# Patient Record
Sex: Male | Born: 1960 | Race: White | Hispanic: No | Marital: Married | Smoking: Never smoker
Health system: Southern US, Community
[De-identification: ages and names within clinical notes are randomized; demographics above are authoritative.]

## PROBLEM LIST (undated history)

## (undated) DIAGNOSIS — G629 Polyneuropathy, unspecified: Secondary | ICD-10-CM

## (undated) DIAGNOSIS — Z87442 Personal history of urinary calculi: Secondary | ICD-10-CM

## (undated) DIAGNOSIS — J45909 Unspecified asthma, uncomplicated: Secondary | ICD-10-CM

## (undated) DIAGNOSIS — N400 Enlarged prostate without lower urinary tract symptoms: Secondary | ICD-10-CM

## (undated) DIAGNOSIS — M797 Fibromyalgia: Secondary | ICD-10-CM

## (undated) DIAGNOSIS — N200 Calculus of kidney: Secondary | ICD-10-CM

## (undated) DIAGNOSIS — Z8719 Personal history of other diseases of the digestive system: Secondary | ICD-10-CM

## (undated) DIAGNOSIS — K219 Gastro-esophageal reflux disease without esophagitis: Secondary | ICD-10-CM

## (undated) DIAGNOSIS — T7840XA Allergy, unspecified, initial encounter: Secondary | ICD-10-CM

## (undated) DIAGNOSIS — G4730 Sleep apnea, unspecified: Secondary | ICD-10-CM

## (undated) DIAGNOSIS — E119 Type 2 diabetes mellitus without complications: Secondary | ICD-10-CM

## (undated) DIAGNOSIS — K2270 Barrett's esophagus without dysplasia: Secondary | ICD-10-CM

## (undated) DIAGNOSIS — E785 Hyperlipidemia, unspecified: Secondary | ICD-10-CM

## (undated) DIAGNOSIS — I10 Essential (primary) hypertension: Secondary | ICD-10-CM

## (undated) HISTORY — DX: Calculus of kidney: N20.0

## (undated) HISTORY — DX: Barrett's esophagus without dysplasia: K22.70

## (undated) HISTORY — DX: Gastro-esophageal reflux disease without esophagitis: K21.9

## (undated) HISTORY — DX: Unspecified asthma, uncomplicated: J45.909

## (undated) HISTORY — DX: Allergy, unspecified, initial encounter: T78.40XA

## (undated) HISTORY — DX: Hyperlipidemia, unspecified: E78.5

## (undated) HISTORY — DX: Polyneuropathy, unspecified: G62.9

## (undated) HISTORY — PX: OTHER SURGICAL HISTORY: SHX169

## (undated) HISTORY — DX: Fibromyalgia: M79.7

---
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
## 1999-11-26 HISTORY — PX: NASAL SINUS SURGERY: SHX719

## 2003-11-26 HISTORY — PX: OTHER SURGICAL HISTORY: SHX169

## 2012-07-15 ENCOUNTER — Other Ambulatory Visit: Payer: Self-pay | Admitting: Family Medicine

## 2012-07-15 DIAGNOSIS — Z8249 Family history of ischemic heart disease and other diseases of the circulatory system: Secondary | ICD-10-CM

## 2012-08-06 ENCOUNTER — Ambulatory Visit
Admission: RE | Admit: 2012-08-06 | Discharge: 2012-08-06 | Disposition: A | Discharge status: Home / Self Care | Payer: 59 | Source: Ambulatory Visit | Attending: Family Medicine | Admitting: Family Medicine

## 2012-08-06 DIAGNOSIS — Z8249 Family history of ischemic heart disease and other diseases of the circulatory system: Secondary | ICD-10-CM

## 2012-08-22 ENCOUNTER — Emergency Department (HOSPITAL_COMMUNITY): Payer: 59

## 2012-08-22 ENCOUNTER — Encounter (HOSPITAL_COMMUNITY): Payer: Self-pay | Admitting: Emergency Medicine

## 2012-08-22 ENCOUNTER — Emergency Department (HOSPITAL_COMMUNITY)
Admission: EM | Admit: 2012-08-22 | Discharge: 2012-08-22 | Disposition: A | Discharge status: Home / Self Care | Payer: 59 | Attending: Emergency Medicine | Admitting: Emergency Medicine

## 2012-08-22 DIAGNOSIS — W260XXA Contact with knife, initial encounter: Secondary | ICD-10-CM | POA: Insufficient documentation

## 2012-08-22 DIAGNOSIS — W261XXA Contact with sword or dagger, initial encounter: Secondary | ICD-10-CM | POA: Insufficient documentation

## 2012-08-22 DIAGNOSIS — IMO0002 Reserved for concepts with insufficient information to code with codable children: Secondary | ICD-10-CM

## 2012-08-22 DIAGNOSIS — S61209A Unspecified open wound of unspecified finger without damage to nail, initial encounter: Secondary | ICD-10-CM | POA: Insufficient documentation

## 2012-08-22 DIAGNOSIS — Z23 Encounter for immunization: Secondary | ICD-10-CM | POA: Insufficient documentation

## 2012-08-22 DIAGNOSIS — Y93H2 Activity, gardening and landscaping: Secondary | ICD-10-CM | POA: Insufficient documentation

## 2012-08-22 DIAGNOSIS — I10 Essential (primary) hypertension: Secondary | ICD-10-CM | POA: Insufficient documentation

## 2012-08-22 HISTORY — DX: Benign prostatic hyperplasia without lower urinary tract symptoms: N40.0

## 2012-08-22 HISTORY — DX: Sleep apnea, unspecified: G47.30

## 2012-08-22 HISTORY — DX: Essential (primary) hypertension: I10

## 2012-08-22 MED ORDER — TRAMADOL HCL 50 MG PO TABS: 50.0000 mg | ORAL_TABLET | Freq: Four times a day (QID) | ORAL | Status: DC | PRN

## 2012-08-22 MED ORDER — OXYCODONE-ACETAMINOPHEN 5-325 MG PO TABS
1.0000 | ORAL_TABLET | Freq: Once | ORAL | Status: AC
  Administered 2012-08-22: 1 via ORAL

## 2012-08-22 MED ORDER — TETANUS-DIPHTHERIA TOXOIDS TD 5-2 LFU IM INJ
0.5000 mL | INJECTION | Freq: Once | INTRAMUSCULAR | Status: AC
  Administered 2012-08-22: 0.5 mL via INTRAMUSCULAR
  Filled 2012-08-22: qty 0.5

## 2012-08-22 MED ORDER — OXYCODONE-ACETAMINOPHEN 5-325 MG PO TABS
ORAL_TABLET | ORAL | Status: AC
  Filled 2012-08-22: qty 1

## 2012-08-22 MED ORDER — AMOXICILLIN-POT CLAVULANATE 875-125 MG PO TABS
1.0000 | ORAL_TABLET | Freq: Once | ORAL | Status: AC
  Administered 2012-08-22: 1 via ORAL
  Filled 2012-08-22: qty 1

## 2012-08-22 MED ORDER — OXYCODONE-ACETAMINOPHEN 5-325 MG PO TABS
1.0000 | ORAL_TABLET | Freq: Once | ORAL | Status: AC
  Administered 2012-08-22: 1 via ORAL
  Filled 2012-08-22: qty 1

## 2012-08-22 MED ORDER — AMOXICILLIN-POT CLAVULANATE 875-125 MG PO TABS: 1.0000 | ORAL_TABLET | Freq: Two times a day (BID) | ORAL | Status: DC

## 2012-08-22 MED ORDER — OXYCODONE-ACETAMINOPHEN 5-325 MG PO TABS: 1.0000 | ORAL_TABLET | Freq: Once | ORAL | Status: AC

## 2012-08-22 NOTE — ED Notes (Signed)
Wife driving pt home

## 2012-08-22 NOTE — ED Provider Notes (Signed)
Medical screening examination/treatment/procedure(s) were performed by non-physician practitioner and as supervising physician I was immediately available for consultation/collaboration.  Magdalene Tardiff B. Mikaelyn Arthurs, MD 08/22/12 2255 

## 2012-08-22 NOTE — ED Notes (Addendum)
Pt stated that he was cutting something and ended up cutting right index and middle finger.  Bleeding is controlled. Dressing on rt hand. Pain is 8 out of 10. Pulses intact. Hand and arm warm to touch. No cardiac or respiratory distress. Will continue to monitor.

## 2012-08-22 NOTE — ED Notes (Signed)
Dermabond removed from trauma pixis and made ready for application by NP.

## 2012-08-22 NOTE — ED Notes (Signed)
Pt was cutting a piece of wood with saw when wood snapped back and saw cut pt right pointer and middle finger. Bleeding controlled.

## 2012-08-22 NOTE — ED Provider Notes (Signed)
History     CSN: [id number]  Arrival date & time 08/22/12  1739   First MD Initiated Contact with Patient 08/22/12 2023      Chief Complaint  Patient presents with  . Extremity Laceration    (Consider location/radiation/quality/duration/timing/severity/associated sxs/prior treatment) HPI Comments: Trying to cut a round branch into fine slices when the branch flipped back pinching R index and middel fingers Now with large tissue deficit to the palmar surface on the index finger and small superficial slice to medial aspect of middle finger    The history is provided by the patient.    Past Medical History  Diagnosis Date  . BPH (benign prostatic hyperplasia)   . Hypertension   . Sleep apnea     Past Surgical History  Procedure Date  . Arm surgery   . Knee surgery     No family history on file.  History  Substance Use Topics  . Smoking status: Never Smoker   . Smokeless tobacco: Not on file  . Alcohol Use: Yes      Review of Systems  Constitutional: Negative for fever and diaphoresis.  Gastrointestinal: Negative for nausea.  Musculoskeletal: Negative for joint swelling.  Skin: Positive for wound.  Neurological: Negative for dizziness, weakness and numbness.    Allergies  Review of patient's allergies indicates no known allergies.  Home Medications   Current Outpatient Rx  Name Route Sig Dispense Refill  . BISOPROLOL-HYDROCHLOROTHIAZIDE 10-6.25 MG PO TABS Oral Take 1 tablet by mouth daily.    Marland Kitchen DOXAZOSIN MESYLATE ER 4 MG PO TB24 Oral Take 4 mg by mouth daily with breakfast.    . PRAVASTATIN SODIUM 40 MG PO TABS Oral Take 80 mg by mouth daily.    . AMOXICILLIN-POT CLAVULANATE 875-125 MG PO TABS Oral Take 1 tablet by mouth 2 (two) times daily. 13 tablet 0  . TRAMADOL HCL 50 MG PO TABS Oral Take 1 tablet (50 mg total) by mouth every 6 (six) hours as needed for pain. 15 tablet 0    BP 142/69  Pulse 88  Temp 98.8 F (37.1 C) (Oral)  Resp 22  SpO2  96%  Physical Exam  Constitutional: He is oriented to person, place, and time. He appears well-developed and well-nourished.  HENT:  Head: Normocephalic.  Eyes: Pupils are equal, round, and reactive to light.  Neck: Normal range of motion.  Cardiovascular: Normal rate.   Pulmonary/Chest: Effort normal.  Musculoskeletal: Normal range of motion. He exhibits tenderness.  Neurological: He is alert and oriented to person, place, and time.  Skin: Skin is warm.       1.5cm X .5cm avulsion of tissue form the palmar aspect of R index finger and superficial slice/laceration to medial aspect R middle finger     ED Course  LACERATION REPAIR Date/Time: 08/22/2012 8:46 PM Performed by: Arman Filter Authorized by: Arman Filter Consent: Verbal consent obtained. Risks and benefits: risks, benefits and alternatives were discussed Consent given by: patient Patient understanding: patient states understanding of the procedure being performed Patient identity confirmed: verbally with patient Time out: Immediately prior to procedure a "time out" was called to verify the correct patient, procedure, equipment, support staff and site/side marked as required. Body area: upper extremity Location details: right index finger Laceration length: 1.5 cm Foreign bodies: wood Tendon involvement: none Nerve involvement: none Vascular damage: no Anesthetic total: 0 ml Patient sedated: no Irrigation solution: saline Amount of cleaning: standard Debridement: none Degree of undermining: none Dressing: gauze  roll and gauze packing Patient tolerance: Patient tolerated the procedure well with no immediate complications. Comments: Packed and dressed    (including critical care time)  Labs Reviewed - No data to display Dg Finger Index Right  08/22/2012  *RADIOLOGY REPORT*  Clinical Data: Extremity laceration  RIGHT INDEX FINGER 2+V  Comparison: None.  Findings: There is soft tissue injury to the distal aspect  of the 2nd right digit.  No evidence of radiodense foreign body.  No evidence of fracture.  IMPRESSION: No fracture or foreign body.  Soft tissue injury.   Original Report Authenticated By: Genevive Bi, M.D.    Dg Finger Middle Right  08/22/2012  *RADIOLOGY REPORT*  Clinical Data: Extremity laceration.  RIGHT MIDDLE FINGER 2+V  Comparison: None.  Findings: No fracture of the middle digit.  There is no radiodense foreign body.  IMPRESSION: No fracture or foreign body.   Original Report Authenticated By: Genevive Bi, M.D.      1. Laceration     LACERATION REPAIR Performed by: Arman Filter Authorized by: Arman Filter Consent: Verbal consent obtained. Risks and benefits: risks, benefits and alternatives were discussed Consent given by: patient Patient identity confirmed: provided demographic data Prepped and Draped in normal sterile fashion Wound explored  Laceration Location: middle finger  Laceration Length: .25cm  No Foreign Bodies seen or palpated  Anesthesia: local infiltration  Local anesthetic: lidocaine none  Anesthetic total: 0ml  Irrigation method: syringe Amount of cleaning: standard  Skin closure: dermabond  Number of sutures: 0  Technique: cleaned and dressed after derma bond   Patient tolerance: Patient tolerated the procedure well with no immediate complications.  MDM  Tissue deficit to distal R index finger packed and dressed  Will Rx antibiotics, pain control and refer  to hand surgery for follow up evaluation and treatment         Arman Filter, NP 08/22/12 2124  Arman Filter, NP 08/22/12 2124

## 2014-06-17 ENCOUNTER — Telehealth: Payer: Self-pay | Admitting: Cardiology

## 2014-06-17 NOTE — Telephone Encounter (Signed)
New Message  Pt called says during his recent travels he has had severe chest pains. Pt went to an ER and found that his heart was enlarged. Pt says he has never heard this from Dr. Marlou Porch and requests a call back to discuss. Please call

## 2014-06-17 NOTE — Telephone Encounter (Signed)
Calling stating he has been traveling and had to go to ER with CP.  Physician told him that he had an enlarged heart and he states he has never been told that is heart was enlarged. States they just told him to take Tylenol for the pain.  Reviewed records from [facility] and could not find documentation of enlarged heart.  Has not had a CXR or Echo.  Advised would send message to Dr. Marlou Porch for his evaluation.  Was going to make him an app but he was getting ready to board a plane and couldn't wait to schedule appointment. Will call back to schedule.

## 2014-06-18 NOTE — Telephone Encounter (Signed)
Have him come in to see me. Likely CXR was read as cardiomegaly (occasionally oversensitive for this finding and can overcall enlarged heart).   2013 NUC stress low risk. Several family members with CAD. ECG no LVH.   Candee Furbish, MD

## 2014-06-21 NOTE — Telephone Encounter (Signed)
Spoke with pt who states enlarged heart DX was from a CXR.  He will akeep the appt as scheduled.  He will bring his records from the ED where he was seen when he comes for his appt.

## 2014-06-21 NOTE — Telephone Encounter (Signed)
Follow up  Pt returned call.. Please call

## 2014-06-21 NOTE — Telephone Encounter (Signed)
Left message for pt to call back to discuss

## 2014-06-21 NOTE — Telephone Encounter (Signed)
Left message for pt to c/b 

## 2014-06-21 NOTE — Telephone Encounter (Signed)
New Prob    Pt would like to speak to a nurse regarding previous encounter in ER (out of town) for chest wall muscle strain and enlarged heart diagnosis. Pt has an appt with Kerin Ransom PA-C 8/7 but wants to know if he possibly needs to be seen sooner. Please call.

## 2014-07-01 ENCOUNTER — Encounter: Payer: Self-pay | Admitting: Cardiology

## 2014-07-01 ENCOUNTER — Ambulatory Visit (INDEPENDENT_AMBULATORY_CARE_PROVIDER_SITE_OTHER): Payer: 59 | Admitting: Cardiology

## 2014-07-01 VITALS — BP 122/64 | HR 60 | Ht 70.0 in | Wt 257.9 lb

## 2014-07-01 DIAGNOSIS — R0600 Dyspnea, unspecified: Secondary | ICD-10-CM

## 2014-07-01 DIAGNOSIS — Z8249 Family history of ischemic heart disease and other diseases of the circulatory system: Secondary | ICD-10-CM | POA: Insufficient documentation

## 2014-07-01 DIAGNOSIS — I10 Essential (primary) hypertension: Secondary | ICD-10-CM | POA: Insufficient documentation

## 2014-07-01 DIAGNOSIS — R0989 Other specified symptoms and signs involving the circulatory and respiratory systems: Secondary | ICD-10-CM

## 2014-07-01 DIAGNOSIS — R079 Chest pain, unspecified: Secondary | ICD-10-CM

## 2014-07-01 DIAGNOSIS — G4730 Sleep apnea, unspecified: Secondary | ICD-10-CM

## 2014-07-01 DIAGNOSIS — E785 Hyperlipidemia, unspecified: Secondary | ICD-10-CM | POA: Insufficient documentation

## 2014-07-01 DIAGNOSIS — E669 Obesity, unspecified: Secondary | ICD-10-CM | POA: Insufficient documentation

## 2014-07-01 DIAGNOSIS — R0609 Other forms of dyspnea: Secondary | ICD-10-CM | POA: Insufficient documentation

## 2014-07-01 NOTE — Assessment & Plan Note (Signed)
Seen in an ER in [place] 05/26/14

## 2014-07-01 NOTE — Assessment & Plan Note (Signed)
C-pap compliant 

## 2014-07-01 NOTE — Assessment & Plan Note (Signed)
BMI 37  

## 2014-07-01 NOTE — Assessment & Plan Note (Signed)
He was told he had an "enlarged heart" at ER visit in July

## 2014-07-01 NOTE — Assessment & Plan Note (Signed)
Brother died at 93

## 2014-07-01 NOTE — Assessment & Plan Note (Signed)
On meds, controlled

## 2014-07-01 NOTE — Progress Notes (Signed)
07/01/2014 Timothy [name]   [birth year]  [id number]  Primary Physicia FULP, CAMMIE, MD Primary Cardiologist: Dr Marlou Porch  HPI:  53 y/o male, seen by Dr Marlou Porch a few years ago after the pts brother died of an MI at age 52. 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 The pt had a stress test 2 years ago that was normal per his report. Recently (05/26/14), while traveling for work, he was seen in an ER in [place] for chest pain. The pt says he had Lt chest and Lt shoulder discomfort that was "pretty bad". He denies any associated nausea or diaphoresis associated with this. He was diagnosed with muscular skeletal pain. Since then he has had milder, intermittent chest discomfort. Recently while rushing through an airport he noticed some DOE that he felt was a little unusual for him. He denies orthopnea or PND.   Current Outpatient Prescriptions  Medication Sig Dispense Refill  . aspirin EC 81 MG tablet Take 81 mg by mouth daily.      . bisoprolol-hydrochlorothiazide (ZIAC) 5-6.25 MG per tablet Take 1 tablet by mouth daily.      . cetirizine (ZYRTEC) 10 MG tablet Take 10 mg by mouth daily.      Marland Kitchen doxazosin (CARDURA XL) 4 MG 24 hr tablet Take 4 mg by mouth daily with breakfast.      . Multiple Vitamin (MULTIVITAMIN) tablet Take 1 tablet by mouth daily.      . pravastatin (PRAVACHOL) 40 MG tablet Take 80 mg by mouth daily.       No current facility-administered medications for this visit.    No Known Allergies  History   Social History  . Marital Status: Married    Spouse Name: N/A    Number of Children: N/A  . Years of Education: N/A   Occupational History  . Not on file.   Social History Main Topics  . Smoking status: Never Smoker   . Smokeless tobacco: Not on file  . Alcohol Use: Yes  . Drug Use: No  . Sexual Activity: Not on file   Other Topics Concern  . Not on file   Social History Narrative  . No narrative  on file     Review of Systems: General: negative for chills, fever, night sweats or weight changes.  Dermatological: negative for rash Respiratory: negative for cough or wheezing Urologic: negative for hematuria Abdominal: negative for nausea, vomiting, diarrhea, bright red blood per rectum, melena, or hematemesis Neurologic: negative for visual changes, syncope, or dizziness Pt has a history of sleep apnea and is compliant with C-pap All other systems reviewed and are otherwise negative except as noted above.    Blood pressure 122/64, pulse 60, height 5\' 10"  (1.778 m), weight 257 lb 14.4 oz (116.983 kg).  General appearance: alert, cooperative, no distress and moderately obese Neck: no carotid bruit and no JVD Lungs: clear to auscultation bilaterally Heart: regular rate and rhythm, S1, S2 normal, no murmur, click, rub or gallop Abdomen: soft, non-tender; bowel sounds normal; no masses,  no organomegaly Extremities: extremities normal, atraumatic, no cyanosis or edema Pulses: 2+ and symmetric Skin: Skin color, texture, turgor normal. No rashes or lesions Neurologic: Grossly normal  EKG NSR   ASSESSMENT AND PLAN:   Chest pain with moderate risk of acute coronary syndrome Seen in an ER in [place] 05/26/14  Family history of coronary artery disease in brother Brother died  at 60  Sleep apnea C-pap compliant  Obesity-  BMI 37  Dyspnea on exertion He was told he had an "enlarged heart" at ER visit in July  HTN (hypertension) On meds, controlled  Dyslipidemia On Pravachol    PLAN  The pt has had chest pain and DOE and has multiple cardiac risk factors. He was also told he had an "enlarged heart" by CXR. I have ordered an exercise Myoview and an Echocardiogram. We'll see if we can get labs from Dr Antony Blackbird. The pt should see Dr Marlou Porch after the above tests.   Nohea Kras KPA-C 07/01/2014 9:12 AM

## 2014-07-01 NOTE — Assessment & Plan Note (Signed)
On Pravachol 

## 2014-07-01 NOTE — Patient Instructions (Signed)
Kerin Ransom, PA-C has requested that you have an echocardiogram. Echocardiography is a painless test that uses sound waves to create images of your heart. It provides your doctor with information about the size and shape of your heart and how well your heart's chambers and valves are working. This procedure takes approximately one hour. There are no restrictions for this procedure.  Your physician has requested that you have en exercise stress myoview. For further information please visit [email or web address]. Please follow instruction sheet, as given.  Your physician recommends that you schedule a follow-up appointment in 4 weeks with Dr Candee Furbish.

## 2014-07-14 ENCOUNTER — Telehealth (HOSPITAL_COMMUNITY): Payer: Self-pay

## 2014-07-14 NOTE — Telephone Encounter (Signed)
Encounter complete. 

## 2014-07-15 ENCOUNTER — Ambulatory Visit (HOSPITAL_COMMUNITY)
Admission: RE | Admit: 2014-07-15 | Discharge: 2014-07-15 | Disposition: A | Discharge status: Home / Self Care | Payer: 59 | Source: Ambulatory Visit | Attending: Cardiovascular Disease | Admitting: Cardiovascular Disease

## 2014-07-15 ENCOUNTER — Ambulatory Visit (HOSPITAL_BASED_OUTPATIENT_CLINIC_OR_DEPARTMENT_OTHER)
Admission: RE | Admit: 2014-07-15 | Discharge: 2014-07-15 | Disposition: A | Discharge status: Home / Self Care | Payer: 59 | Source: Ambulatory Visit | Attending: Cardiovascular Disease | Admitting: Cardiovascular Disease

## 2014-07-15 DIAGNOSIS — R079 Chest pain, unspecified: Secondary | ICD-10-CM | POA: Diagnosis present

## 2014-07-15 DIAGNOSIS — R0989 Other specified symptoms and signs involving the circulatory and respiratory systems: Secondary | ICD-10-CM | POA: Insufficient documentation

## 2014-07-15 DIAGNOSIS — R0609 Other forms of dyspnea: Secondary | ICD-10-CM | POA: Insufficient documentation

## 2014-07-15 DIAGNOSIS — I517 Cardiomegaly: Secondary | ICD-10-CM

## 2014-07-15 MED ORDER — TECHNETIUM TC 99M SESTAMIBI GENERIC - CARDIOLITE
30.4000 | Freq: Once | INTRAVENOUS | Status: AC | PRN
  Administered 2014-07-15: 30 via INTRAVENOUS

## 2014-07-15 MED ORDER — TECHNETIUM TC 99M SESTAMIBI GENERIC - CARDIOLITE
10.2000 | Freq: Once | INTRAVENOUS | Status: AC | PRN
  Administered 2014-07-15: 10 via INTRAVENOUS

## 2014-07-15 NOTE — Progress Notes (Signed)
2D Echocardiogram Complete.  07/15/2014   Sang Blount, RDCS  

## 2014-07-15 NOTE — Procedures (Addendum)
[facility] [address] [place] [address] [phone]  Cardiology Nuclear Med Study  Timothy [name] is a 53 y.o. male     MRN : [id number]     DOB: 22-Jan-1961  Procedure Date: 07/15/2014  Nuclear Med Background Indication for Stress Test:  Evaluation for Ischemia, [facility] and Abnormal EKG History:  "enlarged heart";No other prior cardiac history reported;No prior respiratory history reported;Last NUC MPI in 2003-no further information available. Cardiac Risk Factors: Family History - CAD, Hypertension, Lipids and Obesity  Symptoms:  Chest Pain, DOE and Fatigue   Nuclear Pre-Procedure Caffeine/Decaff Intake:  7:00pm NPO After: 5:00am   IV Site: R Forearm  IV 0.9% NS with Angio Cath:  22g  Chest Size (in):  50"  IV Started by: Rolene Course, RN  Height: 5\' 10"  (1.778 m)  Cup Size: n/a  BMI:  Body mass index is 36.88 kg/(m^2). Weight:  257 lb (116.574 kg)   Tech Comments:  n/a    Nuclear Med Study 1 or 2 day study: 1 day  Stress Test Type:  Stress  Order Authorizing Provider:  Candee Furbish, MD   Resting Radionuclide: Technetium 27m Sestamibi  Resting Radionuclide Dose: 10.2 mCi   Stress Radionuclide:  Technetium 78m Sestamibi  Stress Radionuclide Dose: 30.4 mCi           Stress Protocol Rest HR: 62 Stress HR: 153  Rest BP: 110/73 Stress BP: 209/61  Exercise Time (min): 8 METS: 10.1   Predicted Max HR: 166 bpm % Max HR: 92.17 bpm Rate Pressure Product: 31977  Dose of Adenosine (mg):  n/a Dose of Lexiscan: n/a mg  Dose of Atropine (mg): n/a Dose of Dobutamine: n/a mcg/kg/min (at max HR)  Stress Test Technologist: Leane Para, CCT Nuclear Technologist: Imagene Riches, CNMT   Rest Procedure:  Myocardial perfusion imaging was performed at rest 45 minutes following the intravenous administration of Technetium 49m Sestamibi. Stress Procedure:  The patient performed treadmill exercise using a Bruce  Protocol  for 8 minutes. The patient stopped due to SOB and denied any chest pain.  There were no significant ST-T wave changes.  Technetium 20m Sestamibi was injected IV at peak exercise and myocardial perfusion imaging was performed after a brief delay.  Transient Ischemic Dilatation (Normal <1.22):  1.03  QGS EDV:  111 ml QGS ESV:  44 ml LV Ejection Fraction: 60%     Rest ECG: NSR - Normal EKG  Stress ECG: Significant ST abnormalities consistent with ischemia. 1-2 mm horizontal ST depression in the inferior leads, less prominent in V5-V6  QPS Raw Data Images:  Normal; no motion artifact; normal heart/lung ratio. Stress Images:  There is decreased uptake in the apex. The defect is mall in extent , but moderate to severe in intensity Rest Images:  Comparison with the stress images reveals minimal improvement. Subtraction (SDS):  There is a fixed defect that is most consistent with a previous infarction. LV Wall Motion:  NL LV Function; NL Wall Motion  Impression Exercise Capacity:  Good exercise capacity. BP Response:  Hypertensive blood pressure response. Clinical Symptoms:  No significant symptoms noted. ECG Impression:  Significant ST abnormalities consistent with ischemia. Comparison with Prior Nuclear Study: No images to compare   Overall Impression:  Low risk stress nuclear study with a small apical scar with minimal peri-infarct ischemia.   Sanda Klein, MD  07/15/2014 12:53 PM

## 2014-07-19 ENCOUNTER — Telehealth: Payer: Self-pay | Admitting: Cardiology

## 2014-07-19 NOTE — Telephone Encounter (Signed)
LMTCB

## 2014-07-19 NOTE — Telephone Encounter (Signed)
New message    Calling for test results  

## 2014-07-19 NOTE — Telephone Encounter (Signed)
Spoke with patient about recent myoview results. 

## 2014-07-25 ENCOUNTER — Ambulatory Visit (INDEPENDENT_AMBULATORY_CARE_PROVIDER_SITE_OTHER): Payer: 59 | Admitting: Cardiology

## 2014-07-25 ENCOUNTER — Encounter: Payer: Self-pay | Admitting: Cardiology

## 2014-07-25 VITALS — BP 110/62 | HR 64 | Ht 70.0 in | Wt 259.0 lb

## 2014-07-25 DIAGNOSIS — Z8249 Family history of ischemic heart disease and other diseases of the circulatory system: Secondary | ICD-10-CM

## 2014-07-25 DIAGNOSIS — I10 Essential (primary) hypertension: Secondary | ICD-10-CM

## 2014-07-25 DIAGNOSIS — E669 Obesity, unspecified: Secondary | ICD-10-CM

## 2014-07-25 DIAGNOSIS — R0989 Other specified symptoms and signs involving the circulatory and respiratory systems: Secondary | ICD-10-CM

## 2014-07-25 DIAGNOSIS — E785 Hyperlipidemia, unspecified: Secondary | ICD-10-CM

## 2014-07-25 DIAGNOSIS — R0609 Other forms of dyspnea: Secondary | ICD-10-CM

## 2014-07-25 NOTE — Patient Instructions (Signed)
The current medical regimen is effective;  continue present plan and medications.  Follow up in 1 year with Dr Skains.  You will receive a letter in the mail 2 months before you are due.  Please call us when you receive this letter to schedule your follow up appointment.  

## 2014-07-25 NOTE — Progress Notes (Signed)
[facility]. [address], [address] [place], [place]  [address] Phone: [phone] Fax:  [phone]  Date:  07/25/2014   ID:  Timothy [name], DOB July 22, 1961, MRN [id number]  PCP:  Martinique, BETTY G, MD   History of Present Illness: Timothy [name] is a 53 y.o. male with brother who died of myocardial infarction at age 39, nuclear stress test in 2013 that was normal who was in [place], chest pain associated with nausea, diaphoresis diagnosed with MSK pain. Noted dyspnea on exertion when rushing through the airport.  Because of this, he underwent a nuclear stress test again on 07/15/14 that was low risk, no ischemia. He also had echocardiogram on 07/15/14 that showed normal ejection fraction, grade 2 diastolic dysfunction, mildly elevated pulmonary pressure of 33 mm mercury systolic.  His symptoms have improved. Occasionally he may feel some shortness of breath with exertion. We discussed weight. He recently changed jobs where he will not have overnight phone calls, but more travels.   Wt Readings from Last 3 Encounters:  07/25/14 259 lb (117.482 kg)  07/15/14 257 lb (116.574 kg)  07/01/14 257 lb 14.4 oz (116.983 kg)     Past Medical History  Diagnosis Date  . BPH (benign prostatic hyperplasia)   . Hypertension   . Sleep apnea   . Kidney stones     Past Surgical History  Procedure Laterality Date  . Arm surgery    . Knee surgery    . Nasal sinus surgery      Current Outpatient Prescriptions  Medication Sig Dispense Refill  . aspirin EC 81 MG tablet Take 81 mg by mouth daily.      . bisoprolol-hydrochlorothiazide (ZIAC) 5-6.25 MG per tablet Take 1 tablet by mouth daily.      . cetirizine (ZYRTEC) 10 MG tablet Take 10 mg by mouth daily.      Marland Kitchen doxazosin (CARDURA XL) 4 MG 24 hr tablet Take 4 mg by mouth daily with breakfast.      . Multiple Vitamin (MULTIVITAMIN) tablet Take 1 tablet by mouth daily.      . pravastatin (PRAVACHOL) 80 MG tablet Take 80 mg by mouth daily.       No current  facility-administered medications for this visit.    Allergies:   No Known Allergies  Social History:  The patient  reports that he has never smoked. He does not have any smokeless tobacco history on file. He reports that he drinks alcohol. He reports that he does not use illicit drugs.   Family History  Problem Relation Age of Onset  . Heart disease Father   . Heart disease Paternal Grandmother   . Valvular heart disease Brother   . Heart disease Brother   . Chiari malformation Daughter     ROS:  Please see the history of present illness.   Denies any fevers, chills, orthopnea, PND, syncope   All other systems reviewed and negative.   PHYSICAL EXAM: VS:  BP 110/62  Pulse 64  Ht 5\' 10"  (1.778 m)  Wt 259 lb (117.482 kg)  BMI 37.16 kg/m2 Well nourished, well developed, in no acute distress HEENT: normal, /AT, EOMI Neck: no JVD, normal carotid upstroke, no bruit Cardiac:  normal S1, S2; RRR; no murmur Lungs:  clear to auscultation bilaterally, no wheezing, rhonchi or rales Abd: soft, nontender, no hepatomegaly, no bruitsOverweight Ext: no edema, 2+ distal pulses Skin: warm and dry GU: deferred Neuro: no focal abnormalities noted, AAO x 3  EKG:  None today    Other testing, lab work reviewed. ASSESSMENT AND PLAN:  1. Chest pain/dyspnea-improved. Component of deconditioning, obesity playing a role. Could consider pulmonary evaluation. Nuclear stress test and echocardiogram reassuring. Echocardiogram did demonstrate mildly elevated pulmonary pressures. This is likely secondary to obesity/ OSA. Encourage weight loss. 2. Family history of CAD-Brother 73 MI 3. Obesity-decrease carbohydrates, increase greens, plant products. 4. Hyperlipidemia-pravastatin. Excellent. 5. Hypertension-very well controlled. Excellent. Medications reviewed. 6. We will see him back in one year, prevention.  Signed, Candee Furbish, MD [facility]  07/25/2014 8:10 AM

## 2015-02-06 ENCOUNTER — Other Ambulatory Visit: Payer: Self-pay | Admitting: Family Medicine

## 2015-02-06 DIAGNOSIS — E010 Iodine-deficiency related diffuse (endemic) goiter: Secondary | ICD-10-CM

## 2015-04-07 ENCOUNTER — Ambulatory Visit
Admission: RE | Admit: 2015-04-07 | Discharge: 2015-04-07 | Disposition: A | Discharge status: Home / Self Care | Payer: 59 | Source: Ambulatory Visit | Attending: Family Medicine | Admitting: Family Medicine

## 2015-04-07 DIAGNOSIS — E010 Iodine-deficiency related diffuse (endemic) goiter: Secondary | ICD-10-CM

## 2015-06-30 ENCOUNTER — Other Ambulatory Visit: Payer: Self-pay | Admitting: Family Medicine

## 2015-06-30 ENCOUNTER — Ambulatory Visit
Admission: RE | Admit: 2015-06-30 | Discharge: 2015-06-30 | Disposition: A | Discharge status: Home / Self Care | Payer: 59 | Source: Ambulatory Visit | Attending: Family Medicine | Admitting: Family Medicine

## 2015-06-30 DIAGNOSIS — M5416 Radiculopathy, lumbar region: Secondary | ICD-10-CM

## 2016-01-05 ENCOUNTER — Encounter: Payer: Self-pay | Admitting: Cardiology

## 2016-01-05 ENCOUNTER — Ambulatory Visit (INDEPENDENT_AMBULATORY_CARE_PROVIDER_SITE_OTHER): Payer: 59 | Admitting: Cardiology

## 2016-01-05 VITALS — BP 128/72 | HR 68 | Ht 70.0 in | Wt 247.0 lb

## 2016-01-05 DIAGNOSIS — E785 Hyperlipidemia, unspecified: Secondary | ICD-10-CM

## 2016-01-05 DIAGNOSIS — I10 Essential (primary) hypertension: Secondary | ICD-10-CM | POA: Diagnosis not present

## 2016-01-05 DIAGNOSIS — Z8249 Family history of ischemic heart disease and other diseases of the circulatory system: Secondary | ICD-10-CM

## 2016-01-05 DIAGNOSIS — R5383 Other fatigue: Secondary | ICD-10-CM | POA: Diagnosis not present

## 2016-01-05 NOTE — Patient Instructions (Signed)

## 2016-01-05 NOTE — Progress Notes (Signed)
[facility]. [address], [place]  [address] Phone: [phone] Fax:  [phone]  Date:  01/05/2016   ID:  Timothy [name], DOB [birth year], MRN [id number]  PCP:  [place], BETTY G, MD   History of Present Illness: Timothy [name] is a 55 y.o. male with brother who died of myocardial infarction at age 53, nuclear stress test in 2013 that was normal who was in [place], chest pain associated with nausea, diaphoresis diagnosed with MSK pain. Noted dyspnea on exertion when rushing through the airport.  Because of this, he underwent a nuclear stress test again on 07/15/14 that was low risk, no ischemia. He also had echocardiogram on 07/15/14 that showed normal ejection fraction, grade 2 diastolic dysfunction, mildly elevated pulmonary pressure of 33 mm mercury systolic.  His symptoms have improved. Occasionally he may feel some shortness of breath with exertion. We discussed weight. He recently changed jobs where he will not have overnight phone calls, but more travels.  01/05/16 - Weight up to 260. Diet. Increased fatigue. ?Calorie, increased swelling ankle. He was down to 240 pounds. Has subsequently gained back some weight. He is felt some fatigue in his worried that this is possibly related to his heart potentially. No chest pain, no significant shortness of breath.   Wt Readings from Last 3 Encounters:  01/05/16 247 lb (112.038 kg)  07/25/14 259 lb (117.482 kg)  07/15/14 257 lb (116.574 kg)     Past Medical History  Diagnosis Date  . BPH (benign prostatic hyperplasia)   . Hypertension   . Sleep apnea   . Kidney stones     Past Surgical History  Procedure Laterality Date  . Arm surgery    . Knee surgery    . Nasal sinus surgery      Current Outpatient Prescriptions  Medication Sig Dispense Refill  . aspirin EC 81 MG tablet Take 81 mg by mouth daily.    . bisoprolol-hydrochlorothiazide (ZIAC) 5-6.25 MG per tablet Take 1 tablet by mouth daily.    . cetirizine (ZYRTEC) 10  MG tablet Take 10 mg by mouth daily.    Marland Kitchen doxazosin (CARDURA XL) 4 MG 24 hr tablet Take 4 mg by mouth daily with breakfast.    . Multiple Vitamin (MULTIVITAMIN) tablet Take 1 tablet by mouth daily.    . pravastatin (PRAVACHOL) 80 MG tablet Take 80 mg by mouth daily.     No current facility-administered medications for this visit.    Allergies:   No Known Allergies  Social History:  The patient  reports that he has never smoked. He does not have any smokeless tobacco history on file. He reports that he drinks alcohol. He reports that he does not use illicit drugs.   Family History  Problem Relation Age of Onset  . Heart disease Father   . Heart disease Paternal Grandmother   . Valvular heart disease Brother   . Heart disease Brother   . Chiari malformation Daughter     ROS:  Please see the history of present illness.   Denies any fevers, chills, orthopnea, PND, syncope   All other systems reviewed and negative.   PHYSICAL EXAM: VS:  BP 128/72 mmHg  Pulse 68  Ht 5\' 10"  (1.778 m)  Wt 247 lb (112.038 kg)  BMI 35.44 kg/m2 Well nourished, well developed, in no acute distress HEENT: normal, [place]/AT, EOMI Neck: no JVD, normal carotid upstroke, no bruit Cardiac:  normal S1, S2; RRR; no murmur Lungs:  clear to auscultation bilaterally, no wheezing, rhonchi or rales Abd: soft, nontender, no hepatomegaly, no bruitsOverweight Ext: no edema, 2+ distal pulses Skin: warm and dry GU: deferred Neuro: no focal abnormalities noted, AAO x 3  EKG:  EKG ordered today-01/05/16-sinus rhythm, nonspecific T-wave changes. Personally viewed-heart rate 65.    Other testing, lab work reviewed. ASSESSMENT AND PLAN:  1. Chest pain/dyspnea-no current symptoms. Component of deconditioning, obesity playing a role.  Nuclear stress test and echocardiogram reassuring. Echocardiogram did demonstrate mildly elevated pulmonary pressures. This is likely secondary to obesity/ OSA. Encourage weight  loss. 2. Fatigue-continue with weight loss, exercise. Consider Weight Watchers. He is decreasing his carbohydrates. 3. Family history of CAD-Brother 7 MI 4. Obesity-decrease carbohydrates, increase greens, plant products. 5. Hyperlipidemia-pravastatin. Excellent. 6. Hypertension-very well controlled. Excellent. Medications reviewed. 7. We will see him back in one year, prevention.  Signed, Candee Furbish, MD [facility]  01/05/2016 10:44 AM

## 2016-04-11 ENCOUNTER — Encounter: Payer: Self-pay | Admitting: *Deleted

## 2016-04-12 ENCOUNTER — Ambulatory Visit (INDEPENDENT_AMBULATORY_CARE_PROVIDER_SITE_OTHER): Payer: 59 | Admitting: Diagnostic Neuroimaging

## 2016-04-12 ENCOUNTER — Encounter: Payer: Self-pay | Admitting: Diagnostic Neuroimaging

## 2016-04-12 VITALS — BP 124/68 | HR 62 | Ht 70.0 in | Wt 248.0 lb

## 2016-04-12 DIAGNOSIS — G629 Polyneuropathy, unspecified: Secondary | ICD-10-CM | POA: Diagnosis not present

## 2016-04-12 DIAGNOSIS — E539 Vitamin B deficiency, unspecified: Secondary | ICD-10-CM | POA: Diagnosis not present

## 2016-04-12 DIAGNOSIS — M545 Low back pain, unspecified: Secondary | ICD-10-CM

## 2016-04-12 NOTE — Progress Notes (Signed)
[facility]  PATIENT: Timothy [name] DOB: 03-20-1961  REFERRING CLINICIAN: Ether Griffins HISTORY FROM: patient  REASON FOR VISIT: new consult    HISTORICAL  CHIEF COMPLAINT:  Chief Complaint  Patient presents with  . Numbness  . Pain    HISTORY OF PRESENT ILLNESS:   55 year old right-handed male with hypertension, hypercholesteremia, sleep apnea, kidney stones, here for evaluation of back pain and numbness in the feet. Symptoms present for at least 6 months.  For past 6 months has had midline low back pain. Symptoms worse when he sleeps for a long time. Worsened early morning. Patient improved after activity. No radiating symptoms. No specific triggering factors. No history of severe accident or trauma.  Around the same time patient has also noted numbness and tingling in the balls of the feet, left worse than right side. Symptoms seem to be independent from back pain problems. Patient has had lab screening testing with B12, TSH, A1c which were unremarkable.    REVIEW OF SYSTEMS: Full 14 system review of systems performed and negative with exception of: Numbness allergies blood in urine.   ALLERGIES: No Known Allergies  HOME MEDICATIONS: Outpatient Prescriptions Prior to Visit  Medication Sig Dispense Refill  . aspirin EC 81 MG tablet Take 81 mg by mouth daily.    . bisoprolol-hydrochlorothiazide (ZIAC) 5-6.25 MG per tablet Take 1 tablet by mouth daily.    . cetirizine (ZYRTEC) 10 MG tablet Take 10 mg by mouth daily.    Marland Kitchen doxazosin (CARDURA XL) 4 MG 24 hr tablet Take 4 mg by mouth daily with breakfast.    . Multiple Vitamin (MULTIVITAMIN) tablet Take 1 tablet by mouth daily.    . pravastatin (PRAVACHOL) 80 MG tablet Take 80 mg by mouth daily.     No facility-administered medications prior to visit.    PAST MEDICAL HISTORY: Past Medical History  Diagnosis Date  . BPH (benign prostatic hyperplasia)   . Hypertension   . Sleep apnea     CPAP 100%  .  Kidney stones   . Asthma   . Hyperlipidemia     PAST SURGICAL HISTORY: Past Surgical History  Procedure Laterality Date  . Arm surgery Right 2005  . Knee surgery Bilateral 1976  . Nasal sinus surgery  2001    FAMILY HISTORY: Family History  Problem Relation Age of Onset  . Heart disease Father   . Diabetes Father   . Cancer Father     skin  . Heart disease Paternal Grandmother   . Valvular heart disease Brother   . Heart disease Brother   . Chiari malformation Daughter   . Heart disease Paternal Grandfather     SOCIAL HISTORY:  Social History   Social History  . Marital Status: Married    Spouse Name: Angelita Ingles  . Number of Children: 2  . Years of Education: 16   Occupational History  .      [employer]   Social History Main Topics  . Smoking status: Never Smoker   . Smokeless tobacco: Not on file  . Alcohol Use: 0.0 oz/week    0 Standard drinks or equivalent per week     Comment: rare  . Drug Use: No  . Sexual Activity: Not on file   Other Topics Concern  . Not on file   Social History Narrative   Lives with wife   Caffeine use- coffee 2 cups daily     PHYSICAL EXAM  GENERAL EXAM/CONSTITUTIONAL: Vitals:  Filed  Vitals:   04/12/16 1100  BP: 124/68  Pulse: 62  Height: 5\' 10"  (1.778 m)  Weight: 248 lb (112.492 kg)     Body mass index is 35.58 kg/(m^2).  Visual Acuity Screening   Right eye Left eye Both eyes  Without correction:     With correction: 20/20 20/30      Patient is in no distress; well developed, nourished and groomed; neck is supple  CARDIOVASCULAR:  Examination of carotid arteries is normal; no carotid bruits  Regular rate and rhythm, no murmurs  Examination of peripheral vascular system by observation and palpation is normal  EYES:  Ophthalmoscopic exam of optic discs and posterior segments is normal; no papilledema or hemorrhages  MUSCULOSKELETAL:  Gait, strength, tone, movements noted in Neurologic exam  below  NEUROLOGIC: MENTAL STATUS:  No flowsheet data found.  awake, alert, oriented to person, place and time  recent and remote memory intact  normal attention and concentration  language fluent, comprehension intact, naming intact,   fund of knowledge appropriate  CRANIAL NERVE:   2nd - no papilledema on fundoscopic exam  2nd, 3rd, 4th, 6th - pupils equal and reactive to light, visual fields full to confrontation, extraocular muscles intact, no nystagmus  5th - facial sensation symmetric  7th - facial strength symmetric  8th - hearing intact  9th - palate elevates symmetrically, uvula midline  11th - shoulder shrug symmetric  12th - tongue protrusion midline  MOTOR:   normal bulk and tone, full strength in the BUE, BLE  SENSORY:   normal and symmetric to light touch, temperature, vibration; DECR PP IN FEET  COORDINATION:   finger-nose-finger, fine finger movements normal  REFLEXES:   deep tendon reflexes present and symmetric  GAIT/STATION:   narrow based gait; able to walk on toes, heels and tandem; romberg is negative    DIAGNOSTIC DATA (LABS, IMAGING, TESTING) - I reviewed patient records, labs, notes, testing and imaging myself where available.  No results found for: WBC, HGB, HCT, MCV, PLT No results found for: NA, K, CL, CO2, GLUCOSE, BUN, CREATININE, CALCIUM, PROT, ALBUMIN, AST, ALT, ALKPHOS, BILITOT, GFRNONAA, GFRAA No results found for: CHOL, HDL, LDLCALC, LDLDIRECT, TRIG, CHOLHDL No results found for: HGBA1C No results found for: VITAMINB12 No results found for: TSH  06/30/15 lumbar xray [I reviewed images myself and agree with interpretation. -VRP]  - Slight osteoarthritic change. No fracture or spondylolisthesis.  11/23/15 MRI lumbar [I reviewed images myself and agree with interpretation. -VRP]  1. Very mild for age disc degeneration limited to L4-L5 where mild circumferential disc bulging and an annular fissure of the disc is noted  at the left neural foramen adjacent to the exiting left L4 nerve. Query left L4 radiculitis. 2. Mild to moderate lumbar facet degeneration maximal at L4-L5 and L5-S1.  3. No lumbar spinal stenosis or other neural impingement.     ASSESSMENT AND PLAN  55 y.o. year old male here with low back pain and independent lower extremity numbness and tingling for past 6 months. Low back pain issues may be more musculoskeletal in nature. The lower extremity/feet numbness and tingling may be related to small fiber neuropathy. Will check additional testing with EMG nerve conduction study. Offered neuropathic pain medications and topical cream the patient would like to hold off for now.   Ddx:  1. Small fiber neuropathy (HCC)   2. Burning feet syndrome   3. Midline low back pain without sciatica      PLAN:  Orders Placed  This Encounter  Procedures  . NCV with EMG(electromyography)   Return for for NCV/EMG.    Penni Bombard, MD [id number], [id number] AM Certified in Neurology, Neurophysiology and Neuroimaging  [facility] [address] [place],  [address] [phone]

## 2016-04-12 NOTE — Patient Instructions (Signed)
Thank you for coming to see [place] at [facility]. I hope we have been able to provide you high quality care today.  You may receive a patient satisfaction survey over the next few weeks. We would appreciate your feedback and comments so that we may continue to improve ourselves and the health of our patients.  - I will setup electrical nerve testing (EMG/NCS).   ~~~~~~~~~~~~~~~~~~~~~~~~~~~~~~~~~~~~~~~~~~~~~~~~~~~~~~~~~~~~~~~~~  DR. PENUMALLI'S GUIDE TO HAPPY AND HEALTHY LIVING These are some of my general health and wellness recommendations. Some of them may apply to you better than others. Please use common sense as you try these suggestions and feel free to ask me any questions.   ACTIVITY/FITNESS Mental, social, emotional and physical stimulation are very important for brain and body health. Try learning a new activity (arts, music, language, sports, games).  Keep moving your body to the best of your abilities. You can do this at home, inside or outside, the park, community center, gym or anywhere you like. Consider a physical therapist or personal trainer to get started. Consider the app Sworkit. Fitness trackers such as smart-watches, smart-phones or Fitbits can help as well.   NUTRITION Eat more plants: colorful vegetables, nuts, seeds and berries.  Eat less sugar, salt, preservatives and processed foods.  Avoid toxins such as cigarettes and alcohol.  Drink water when you are thirsty. Warm water with a slice of lemon is an excellent morning drink to start the day.  Consider these websites for more information The Nutrition Source (https://www.henry-hernandez.biz/) Precision Nutrition (WindowBlog.ch)   RELAXATION Consider practicing mindfulness meditation or other relaxation techniques such as deep breathing, prayer, yoga, tai chi, massage. See website mindful.org or the apps Headspace or Calm to help get  started.   SLEEP Try to get at least 7-8+ hours sleep per day. Regular exercise and reduced caffeine will help you sleep better. Practice good sleep hygeine techniques. See website sleep.org for more information.   PLANNING Prepare estate planning, living will, healthcare POA documents. Sometimes this is best planned with the help of an attorney. Theconversationproject.org and agingwithdignity.org are excellent resources.

## 2016-05-16 ENCOUNTER — Ambulatory Visit (INDEPENDENT_AMBULATORY_CARE_PROVIDER_SITE_OTHER): Payer: 59 | Admitting: Diagnostic Neuroimaging

## 2016-05-16 ENCOUNTER — Encounter (INDEPENDENT_AMBULATORY_CARE_PROVIDER_SITE_OTHER): Payer: Self-pay | Admitting: Diagnostic Neuroimaging

## 2016-05-16 DIAGNOSIS — M545 Low back pain, unspecified: Secondary | ICD-10-CM

## 2016-05-16 DIAGNOSIS — G629 Polyneuropathy, unspecified: Secondary | ICD-10-CM

## 2016-05-16 DIAGNOSIS — E539 Vitamin B deficiency, unspecified: Secondary | ICD-10-CM

## 2016-05-16 DIAGNOSIS — Z0289 Encounter for other administrative examinations: Secondary | ICD-10-CM

## 2016-05-22 NOTE — Procedures (Signed)
   [facility]  NCS (NERVE CONDUCTION STUDY) WITH EMG (ELECTROMYOGRAPHY) REPORT   STUDY DATE: 05/16/16 PATIENT NAME: Timothy [name] DOB: 09-11-1961 MRN: [id number]  ORDERING CLINICIAN: Andrey Spearman, MD   TECHNOLOGIST: Laretta Alstrom  ELECTROMYOGRAPHER: Earlean Polka. Alexius Ellington, MD  CLINICAL INFORMATION: 55 year old male with lower extremity numbness and burning.  FINDINGS: NERVE CONDUCTION STUDY: Bilateral peroneal and tibial motor responses are normal. Bilateral H reflex responses are normal. Bilateral peroneal sensory responses are normal.  NEEDLE ELECTROMYOGRAPHY: Needle examination of left lower extremity vastus medialis, tibialis anterior, gastrocnemius is normal.   IMPRESSION:  Normal study. No evidence of large fiber neuropathy at this time.    INTERPRETING PHYSICIAN:  Penni Bombard, MD Certified in Neurology, Neurophysiology and Neuroimaging  [facility] [address] [place], [place] [address] [phone]

## 2016-06-28 ENCOUNTER — Encounter (HOSPITAL_COMMUNITY): Payer: Self-pay | Admitting: Emergency Medicine

## 2016-06-28 ENCOUNTER — Ambulatory Visit (HOSPITAL_COMMUNITY)
Admission: EM | Admit: 2016-06-28 | Discharge: 2016-06-28 | Disposition: A | Discharge status: Home / Self Care | Payer: 59 | Attending: Emergency Medicine | Admitting: Emergency Medicine

## 2016-06-28 DIAGNOSIS — H109 Unspecified conjunctivitis: Secondary | ICD-10-CM | POA: Diagnosis not present

## 2016-06-28 MED ORDER — POLYMYXIN B-TRIMETHOPRIM 10000-0.1 UNIT/ML-% OP SOLN: 1.0000 [drp] | Freq: Four times a day (QID) | OPHTHALMIC | 0 refills | Status: DC

## 2016-06-28 NOTE — ED Triage Notes (Signed)
PT was on a flight yesterday when his left eye started bothering him. No known injury or irritation. Drainage noted on affected eye.

## 2016-06-28 NOTE — ED Provider Notes (Signed)
[facility]    CSN: [id number] Arrival date & time: 06/28/16  1814  First Provider Contact:  First MD Initiated Contact with Patient 06/28/16 1921        History   Chief Complaint Chief Complaint  Patient presents with  . Conjunctivitis    HPI Timothy [name] is a 55 y.o. male.   He is a 55 year old man here for evaluation of possible pinkeye. He states he noticed some irritation to the left eye yesterday when he was flying home. It has gotten progressively worse today. He reports the eye feels irritated and itchy. He has had a lot of drainage from the eye. He reports his vision is blurred, particularly looking to the left. He denies any photophobia, pain, or foreign body sensation. He does have a daughter at home who is immunocompromised.      Past Medical History:  Diagnosis Date  . Asthma   . BPH (benign prostatic hyperplasia)   . Hyperlipidemia   . Hypertension   . Kidney stones   . Sleep apnea    CPAP 100%    Patient Active Problem List   Diagnosis Date Noted  . Chest pain with moderate risk of acute coronary syndrome 07/01/2014  . Family history of coronary artery disease in brother 07/01/2014  . Sleep apnea 07/01/2014  . Obesity-  07/01/2014  . Dyspnea on exertion 07/01/2014  . HTN (hypertension) 07/01/2014  . Dyslipidemia 07/01/2014    Past Surgical History:  Procedure Laterality Date  . arm surgery Right 2005  . KNEE SURGERY Bilateral 1976  . NASAL SINUS SURGERY  2001       Home Medications    Prior to Admission medications   Medication Sig Start Date End Date Taking? Authorizing Provider  aspirin EC 81 MG tablet Take 81 mg by mouth daily.    Historical Provider, MD  bisoprolol-hydrochlorothiazide [facility]) 5-6.25 MG per tablet Take 1 tablet by mouth daily.    Historical Provider, MD  cetirizine (ZYRTEC) 10 MG tablet Take 10 mg by mouth daily.    Historical Provider, MD  doxazosin (CARDURA XL) 4 MG 24 hr tablet Take 4 mg by mouth daily with  breakfast.    Historical Provider, MD  Multiple Vitamin (MULTIVITAMIN) tablet Take 1 tablet by mouth daily.    Historical Provider, MD  Naproxen Sodium (ALEVE PO) Take by mouth. As needed    Historical Provider, MD  pravastatin (PRAVACHOL) 80 MG tablet Take 80 mg by mouth daily.    Historical Provider, MD  trimethoprim-polymyxin b (POLYTRIM) ophthalmic solution Place 1 drop into the left eye 4 (four) times daily. For 5 days 06/28/16   Melony Overly, MD    Family History Family History  Problem Relation Age of Onset  . Heart disease Father   . Diabetes Father   . Cancer Father     skin  . Heart disease Paternal Grandmother   . Valvular heart disease Brother   . Heart disease Brother   . Chiari malformation Daughter   . Heart disease Paternal Grandfather     Social History Social History  Substance Use Topics  . Smoking status: Never Smoker  . Smokeless tobacco: Never Used  . Alcohol use 0.0 oz/week     Comment: rare     Allergies   Review of patient's allergies indicates no known allergies.   Review of Systems Review of Systems  Constitutional: Negative for fever.  Eyes: Positive for discharge, redness and visual disturbance. Negative for photophobia  and pain.     Physical Exam Triage Vital Signs ED Triage Vitals [06/28/16 1944]  Enc Vitals Group     BP 128/72     Pulse Rate 63     Resp 16     Temp 98.2 F (36.8 C)     Temp Source Oral     SpO2 99 %     Weight      Height      Head Circumference      Peak Flow      Pain Score 0     Pain Loc      Pain Edu?      Excl. in [place]?    No data found.   Updated Vital Signs BP 128/72   Pulse 63   Temp 98.2 F (36.8 C) (Oral)   Resp 16   SpO2 99%   Visual Acuity Right Eye Distance: 20/15 Left Eye Distance: 20/20 Bilateral Distance: 20/15  Right Eye Near:   Left Eye Near:    Bilateral Near:     Physical Exam  Constitutional: He is oriented to person, place, and time. He appears well-developed and  well-nourished. No distress.  Eyes: EOM are normal. Pupils are equal, round, and reactive to light. Left eye exhibits discharge.  Left conjunctiva is injected  Cardiovascular: Normal rate.   Pulmonary/Chest: Effort normal.  Neurological: He is alert and oriented to person, place, and time.     UC Treatments / Results  Labs (all labs ordered are listed, but only abnormal results are displayed) Labs Reviewed - No data to display  EKG  EKG Interpretation None       Radiology No results found.  Procedures Procedures (including critical care time)  Medications Ordered in UC Medications - No data to display   Initial Impression / Assessment and Plan / UC Course  I have reviewed the triage vital signs and the nursing notes.  Pertinent labs & imaging results that were available during my care of the patient were reviewed by me and considered in my medical decision making (see chart for details).  Clinical Course    Visual acuity is normal. We'll treat with Polytrim eyedrops. Follow-up as needed.  Final Clinical Impressions(s) / UC Diagnoses   Final diagnoses:  Conjunctivitis of left eye    New Prescriptions New Prescriptions   TRIMETHOPRIM-POLYMYXIN B (POLYTRIM) OPHTHALMIC SOLUTION    Place 1 drop into the left eye 4 (four) times daily. For 5 days     Melony Overly, MD 06/28/16 2014

## 2016-06-28 NOTE — Discharge Instructions (Signed)
You have conjunctivitis or pinkeye. Use the eyedrops 4 times a day for the next 5 days. Follow-up as needed.

## 2016-08-07 ENCOUNTER — Telehealth: Payer: Self-pay | Admitting: Cardiology

## 2016-08-07 NOTE — Telephone Encounter (Signed)
Spoke with pt; he states that for the last 3 days he has been having a dull aching pain on his back between his shouders and front chest on and off. Now it radiating to his left arm pint and travel to his fingers. Pt is in [place]. Pt at this time is in the hospital ED . He is scheduled to have a stress test and will be in the hospital overnight. Pt is aware that the last stress test was done here in 07/15/14.

## 2016-08-07 NOTE — Telephone Encounter (Signed)
New message    Pt verbalized that he has had discomfort, dull ache in his left arm pit and it travels all the way down to his fingers  He is out of town in [place] and wants to know if he should go to the ED or if there is a Film/video editor he needs to see in [place]

## 2016-09-16 ENCOUNTER — Encounter: Payer: Self-pay | Admitting: Cardiology

## 2016-09-16 ENCOUNTER — Telehealth: Payer: Self-pay | Admitting: Cardiology

## 2016-09-16 NOTE — Telephone Encounter (Signed)
New message      Pt c/o of Chest Pain: STAT if CP now or developed within 24 hours  1. Are you having CP right now? Shoulder blade pain/back  2. Are you experiencing any other symptoms (ex. SOB, nausea, vomiting, sweating)? no 3. How long have you been experiencing CP? Off and on for a few days----pt had chest pain while traveling about 1 month ago--went to ER at [facility] in [place] and had a  stress test 4. Is your CP continuous or coming and going?  Comes and goes 5. Have you taken Nitroglycerin? ?

## 2016-09-16 NOTE — Telephone Encounter (Signed)
I called [facility] [phone] and spoke with the medical records department. They will send a copy of the patient's most recent nuclear stress test report.

## 2016-09-16 NOTE — Telephone Encounter (Signed)
Nuclear stress test report received from [facility] dated 08/08/16: "Impression: 1) No evidence of myocardial ischemia. 2) Normal wall motion and ejection fraction"  I called the patient and made him aware of these findings. He states that he has had almost constant chest discomfort/ shoulder discomfort since he was discharged from [facility]. He was on a flight Friday and was having symptoms of shoulder pain with some slight chest pressure, that was almost as bad as what took him to the hospital in September.  He had a CT of the chest 08/07/16 that was negative except for a moderate paraesophageal hiatal hernia. He reports that his enzymes were negative.  Per the patient, he was instructed at discharge to follow up with his cardiologist as soon as possible. The patient does not appear to be in acute distress at this time. I have offered him an appointment with Dr. Marlou Porch on Wednesday 09/18/16 at 1:45 pm. He is agreeable.

## 2016-09-18 ENCOUNTER — Ambulatory Visit (INDEPENDENT_AMBULATORY_CARE_PROVIDER_SITE_OTHER): Payer: 59 | Admitting: Cardiology

## 2016-09-18 ENCOUNTER — Encounter (INDEPENDENT_AMBULATORY_CARE_PROVIDER_SITE_OTHER): Payer: Self-pay

## 2016-09-18 ENCOUNTER — Encounter: Payer: Self-pay | Admitting: Cardiology

## 2016-09-18 VITALS — BP 148/80 | HR 82 | Ht 70.0 in | Wt 256.6 lb

## 2016-09-18 DIAGNOSIS — R079 Chest pain, unspecified: Secondary | ICD-10-CM | POA: Diagnosis not present

## 2016-09-18 DIAGNOSIS — E785 Hyperlipidemia, unspecified: Secondary | ICD-10-CM

## 2016-09-18 DIAGNOSIS — I10 Essential (primary) hypertension: Secondary | ICD-10-CM | POA: Diagnosis not present

## 2016-09-18 DIAGNOSIS — M549 Dorsalgia, unspecified: Secondary | ICD-10-CM | POA: Diagnosis not present

## 2016-09-18 DIAGNOSIS — G8929 Other chronic pain: Secondary | ICD-10-CM

## 2016-09-18 NOTE — Progress Notes (Signed)
[place]. [address] [place],   [address] Phone: [phone] Fax:  [phone]  Date:  09/18/2016   ID:  Timothy [name], DOB 1961-11-14, MRN [id number]  PCP:  Antony Blackbird, MD   History of Present Illness: Timothy [name] is a 55 y.o. male with brother who died of myocardial infarction at age 52, nuclear stress test in 2013 that was normal, chest pain associated with nausea, diaphoresis diagnosed with MSK pain. Noted dyspnea on exertion when rushing through the airport.  Because of this, he underwent a nuclear stress test again on 07/15/14 that was low risk, no ischemia. He also had echocardiogram on 07/15/14 that showed normal ejection fraction, grade 2 diastolic dysfunction, mildly elevated pulmonary pressure of 33 mm mercury systolic.  His symptoms have improved. Occasionally he may feel some shortness of breath with exertion. We discussed weight. He recently changed jobs where he will not have overnight phone calls, but more travels.  01/05/16 - Weight up to 260. Diet. Increased fatigue. ?Calorie, increased swelling ankle. He was down to 240 pounds. Has subsequently gained back some weight. He is felt some fatigue in his worried that this is possibly related to his heart potentially. No chest pain, no significant shortness of breath.  09/18/16-he was traveling, went to the hospital at [facility] believe in [place] where he was having continuous chest discomfort after a flight with shoulder pain and chest pressure. He had a CT of his chest on 08/07/16 that was negative except for moderate esophageal hiatal hernia. Cardiac markers were negative. He then underwent a nuclear stress test on 08/08/16 that showed no ischemia, normal EF.  Reassuring cardiac workup. He still continues to have negative back pain that sometimes wraps around to his chest. Low-grade, does not seem to be exacerbated with any certain movement or activity. Does not change with eating.  Wt Readings from Last 3  Encounters:  09/18/16 256 lb 9.6 oz (116.4 kg)  04/12/16 248 lb (112.5 kg)  01/05/16 247 lb (112 kg)     Past Medical History:  Diagnosis Date  . Asthma   . BPH (benign prostatic hyperplasia)   . Hyperlipidemia   . Hypertension   . Kidney stones   . Sleep apnea    CPAP 100%    Past Surgical History:  Procedure Laterality Date  . arm surgery Right 2005  . KNEE SURGERY Bilateral 1976  . NASAL SINUS SURGERY  2001    Current Outpatient Prescriptions  Medication Sig Dispense Refill  . aspirin EC 81 MG tablet Take 81 mg by mouth daily.    . bisoprolol-hydrochlorothiazide (ZIAC) 5-6.25 MG per tablet Take 1 tablet by mouth daily.    . cetirizine (ZYRTEC) 10 MG tablet Take 10 mg by mouth daily.    Marland Kitchen doxazosin (CARDURA XL) 4 MG 24 hr tablet Take 4 mg by mouth daily with breakfast.    . Multiple Vitamin (MULTIVITAMIN) tablet Take 1 tablet by mouth daily.    . Naproxen Sodium (ALEVE PO) Take by mouth. As needed    . pravastatin (PRAVACHOL) 80 MG tablet Take 80 mg by mouth daily.     No current facility-administered medications for this visit.     Allergies:   No Known Allergies  Social History:  The patient  reports that he has never smoked. He has never used smokeless tobacco. He reports that he drinks alcohol. He reports that he does not use drugs.   Family History  Problem  Relation Age of Onset  . Heart disease Father   . Diabetes Father   . Cancer Father     skin  . Heart disease Paternal Grandmother   . Valvular heart disease Brother   . Heart disease Brother   . Chiari malformation Daughter   . Heart disease Paternal Grandfather     ROS:  Please see the history of present illness.   Denies any fevers, chills, orthopnea, PND, syncope   All other systems reviewed and negative.   PHYSICAL EXAM: VS:  BP (!) 148/80   Pulse 82   Ht 5\' 10"  (1.778 m)   Wt 256 lb 9.6 oz (116.4 kg)   BMI 36.82 kg/m  Well nourished, well developed, in no acute distress HEENT: normal,  [place]/AT, EOMI Neck: no JVD, normal carotid upstroke, no bruit Cardiac:  normal S1, S2; RRR; no murmur Lungs:  clear to auscultation bilaterally, no wheezing, rhonchi or rales Abd: soft, nontender, no hepatomegaly, no bruitsOverweight Ext: no edema, 2+ distal pulses Skin: warm and dry GU: deferred Neuro: no focal abnormalities noted, AAO x 3  EKG:  EKG ordered today-09/18/16-sinus rhythm 83 with no other abnormalities. 01/05/16-sinus rhythm, nonspecific T-wave changes. Personally viewed-heart rate 65.    Other testing, lab work reviewed.  Nuclear stress test 08/08/16 at [facility]-no ischemia, normal ejection fraction, low risk.  ASSESSMENT AND PLAN:  1. Chest pain - continuous, low-grade, pain that is mostly back pain between shoulder blades lasting all day long, sometimes radiates the front of his chest but has not left him and has been quite continuous since his hospitalization in [place] on 08/08/16. He has seen orthopedics in the past and he remembers stating that he had a disc problem in his lumbar spine are otherwise unremarkable. He also saw neurology and he remembers being told that his left leg neuropathic pain was likely a manifestation of fibromyalgia. Thankfully, he had a complete and full workup in [place] including troponins that were negative, EKG negative, CT of chest showed no evidence of dissection or pulmonary embolism and nuclear stress test that was low risk with no ischemia. His symptoms currently are not anginal-like in character. They're likely musculoskeletal or perhaps neuropathic. I encouraged him to contact his orthopedist once again to have his back reevaluated. He does state that Aleve when taken does help the pain subsided. No further cardiac workup at this time. 2. Fatigue-continue with weight loss, exercise. Consider Weight Watchers. He is decreasing his carbohydrates. 3. Family history of CAD-Brother 53 MI, unfortunately his mother died shortly after open  heart surgery. 4. Obesity-decrease carbohydrates, increase greens, plant products. 5. Hyperlipidemia-pravastatin. Excellent. 6. Hypertension-very well controlled. Excellent. Medications reviewed. We will see him back on as-needed basis  Signed, Candee Furbish, MD [facility]  09/18/2016 2:22 PM

## 2016-09-18 NOTE — Patient Instructions (Signed)
Your physician recommends that you continue on your current medications as directed. Please refer to the Current Medication list given to you today. Your physician recommends that you schedule a follow-up appointment as needed with Dr. Marlou Porch.  Dr. Marlou Porch recommends that you follow up with the orthopedic specialist that you have seen in the past.

## 2016-10-11 ENCOUNTER — Ambulatory Visit: Payer: 59 | Admitting: Cardiology

## 2016-11-01 ENCOUNTER — Ambulatory Visit: Payer: Self-pay | Admitting: Surgery

## 2016-11-01 NOTE — H&P (Signed)
Timothy [name] 11/01/2016 1:14 PM Location: [facility] Patient #: [id number] DOB: 01-05-1961 Married / Language: English / Race: White Male  History of Present Illness (Perian Tedder A. Llesenia Fogal MD; 11/01/2016 3:05 PM) Patient words: Noticed a lump on his abdomen recently just near where he developed a pimple a couple of weeks ago. It does not cause any pain. It is not tender. He does not know if it is changed in size. He wants it removed to confirm that there is nothing malignant there.  The patient is a 55 year old male.   Other Problems Benjiman Core, CMA; 11/01/2016 1:32 PM) Arthritis Asthma Back Pain Chest pain Enlarged Prostate Gastroesophageal Reflux Disease Hypercholesterolemia Kidney Stone Sleep Apnea  Past Surgical History Benjiman Core, CMA; 11/01/2016 1:32 PM) Knee Surgery Bilateral. Oral Surgery  Diagnostic Studies History Benjiman Core, CMA; 11/01/2016 1:32 PM) Colonoscopy 1-5 years ago  Allergies Benjiman Core, CMA; 11/01/2016 1:34 PM) No Known Drug Allergies 11/01/2016  Medication History (Armen Eulas Post, CMA; 11/01/2016 1:36 PM) Pravastatin Sodium (80MG  Tablet, Oral) Active. Aspirin (81MG  Tablet, Oral) Active. Bisoprolol-Hydrochlorothiazide (5-6.25MG  Tablet, Oral) Active. ZyrTEC (10MG  Tablet, Oral) Active. Doxazosin Mesylate ER (4MG  Tablet ER 24HR, Oral) Active. Multivitamin Adult (Oral) Active. Aleve (220MG  Capsule, Oral) Active. Medications Reconciled  Social History Benjiman Core, CMA; 11/01/2016 1:32 PM) Alcohol use Occasional alcohol use. Caffeine use Coffee, Tea. No drug use Tobacco use Never smoker.  Family History Benjiman Core, CMA; 11/01/2016 1:32 PM) Bleeding disorder Daughter. Diabetes Mellitus Father. Heart Disease Brother, Daughter, Father, Mother. Heart disease in male family member before age 53 Hypertension Brother, Father. Respiratory Condition Daughter.     Review of Systems (Armen Eulas Post CMA;  11/01/2016 1:32 PM) General Not Present- Appetite Loss, Chills, Fatigue, Fever, Night Sweats, Weight Gain and Weight Loss. Skin Not Present- Change in Wart/Mole, Dryness, Hives, Jaundice, New Lesions, Non-Healing Wounds, Rash and Ulcer. HEENT Not Present- Earache, Hearing Loss, Hoarseness, Nose Bleed, Oral Ulcers, Ringing in the Ears, Seasonal Allergies, Sinus Pain, Sore Throat, Visual Disturbances, Wears glasses/contact lenses and Yellow Eyes. Respiratory Not Present- Bloody sputum, Chronic Cough, Difficulty Breathing, Snoring and Wheezing. Breast Not Present- Breast Mass, Breast Pain, Nipple Discharge and Skin Changes. Cardiovascular Present- Swelling of Extremities. Not Present- Chest Pain, Difficulty Breathing Lying Down, Leg Cramps, Palpitations, Rapid Heart Rate and Shortness of Breath. Gastrointestinal Not Present- Abdominal Pain, Bloating, Bloody Stool, Change in Bowel Habits, Chronic diarrhea, Constipation, Difficulty Swallowing, Excessive gas, Gets full quickly at meals, Hemorrhoids, Indigestion, Nausea, Rectal Pain and Vomiting. Male Genitourinary Not Present- Blood in Urine, Change in Urinary Stream, Frequency, Impotence, Nocturia, Painful Urination, Urgency and Urine Leakage. Musculoskeletal Present- Back Pain, Joint Stiffness and Swelling of Extremities. Not Present- Joint Pain, Muscle Pain and Muscle Weakness. Neurological Not Present- Decreased Memory, Fainting, Headaches, Numbness, Seizures, Tingling, Tremor, Trouble walking and Weakness. Psychiatric Not Present- Anxiety, Bipolar, Change in Sleep Pattern, Depression, Fearful and Frequent crying. Endocrine Not Present- Cold Intolerance, Excessive Hunger, Hair Changes, Heat Intolerance, Hot flashes and New Diabetes. Hematology Not Present- Blood Thinners, Easy Bruising, Excessive bleeding, Gland problems, HIV and Persistent Infections.  Vitals (Armen Glenn CMA; 11/01/2016 1:34 PM) 11/01/2016 1:33 PM Weight: 262.38 lb Height:  70in Body Surface Area: 2.34 m Body Mass Index: 37.65 kg/m  Temp.: 98.38F  Pulse: 69 (Regular)  P.OX: 96% (Room air) BP: 118/62 (Sitting, Left Arm, Standard)      Physical Exam (Audrinna Sherman A. Kae Heller MD; 11/01/2016 3:06 PM)  The physical exam findings are as follows: Note:Overweight Caucasian male sitting probably in  the exam chair Extraocular motion intact. Anicteric. Moist because membranes Neck without mass or thyromegaly Unlabored respirations. Clear bilaterally Obese abdomen, soft, nontender, nondistended. There is a 2 cm lipoma in the right mid abdominal wall superior to the umbilicus. No mass or organomegaly are palpable Extremities are warm without deformity Neuro grossly intact, normal gait Psych normal mood, anxious affect Skin no rashes or lesions    Assessment & Plan (Tianni Escamilla A. 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 We will plan for excision under [facility] as an outpatient in the coming weeks.

## 2016-11-05 ENCOUNTER — Encounter (HOSPITAL_COMMUNITY): Payer: Self-pay

## 2016-11-05 ENCOUNTER — Encounter (HOSPITAL_COMMUNITY)
Admission: RE | Admit: 2016-11-05 | Discharge: 2016-11-05 | Disposition: A | Discharge status: Home / Self Care | Payer: 59 | Source: Ambulatory Visit | Attending: Surgery | Admitting: Surgery

## 2016-11-05 DIAGNOSIS — J45909 Unspecified asthma, uncomplicated: Secondary | ICD-10-CM | POA: Diagnosis not present

## 2016-11-05 DIAGNOSIS — Z7982 Long term (current) use of aspirin: Secondary | ICD-10-CM | POA: Diagnosis not present

## 2016-11-05 DIAGNOSIS — E7800 Pure hypercholesterolemia, unspecified: Secondary | ICD-10-CM | POA: Diagnosis not present

## 2016-11-05 DIAGNOSIS — D171 Benign lipomatous neoplasm of skin and subcutaneous tissue of trunk: Secondary | ICD-10-CM | POA: Diagnosis present

## 2016-11-05 DIAGNOSIS — D1779 Benign lipomatous neoplasm of other sites: Secondary | ICD-10-CM | POA: Insufficient documentation

## 2016-11-05 DIAGNOSIS — Z01812 Encounter for preprocedural laboratory examination: Secondary | ICD-10-CM | POA: Insufficient documentation

## 2016-11-05 DIAGNOSIS — Z79899 Other long term (current) drug therapy: Secondary | ICD-10-CM | POA: Diagnosis not present

## 2016-11-05 DIAGNOSIS — I10 Essential (primary) hypertension: Secondary | ICD-10-CM | POA: Diagnosis not present

## 2016-11-05 DIAGNOSIS — Z6837 Body mass index (BMI) 37.0-37.9, adult: Secondary | ICD-10-CM | POA: Diagnosis not present

## 2016-11-05 DIAGNOSIS — G4730 Sleep apnea, unspecified: Secondary | ICD-10-CM | POA: Diagnosis not present

## 2016-11-05 HISTORY — DX: Personal history of urinary calculi: Z87.442

## 2016-11-05 HISTORY — DX: Personal history of other diseases of the digestive system: Z87.19

## 2016-11-05 LAB — CBC
HCT: 43.3 % (ref 39.0–52.0)
HEMOGLOBIN: 14.5 g/dL (ref 13.0–17.0)
MCH: 26.7 pg (ref 26.0–34.0)
MCHC: 33.5 g/dL (ref 30.0–36.0)
MCV: 79.7 fL (ref 78.0–100.0)
Platelets: 236 10*3/uL (ref 150–400)
RBC: 5.43 MIL/uL (ref 4.22–5.81)
RDW: 14.2 % (ref 11.5–15.5)
WBC: 5.5 10*3/uL (ref 4.0–10.5)

## 2016-11-05 LAB — BASIC METABOLIC PANEL
ANION GAP: 8 (ref 5–15)
BUN: 16 mg/dL (ref 6–20)
CALCIUM: 8.6 mg/dL — AB (ref 8.9–10.3)
CO2: 24 mmol/L (ref 22–32)
Chloride: 106 mmol/L (ref 101–111)
Creatinine, Ser: 1.1 mg/dL (ref 0.61–1.24)
GFR calc non Af Amer: >60 mL/min (ref >60)
Glucose, Bld: 116 mg/dL — ABNORMAL HIGH (ref 65–99)
Potassium: 3.5 mmol/L (ref 3.5–5.1)
Sodium: 138 mmol/L (ref 135–145)

## 2016-11-05 NOTE — Patient Instructions (Signed)
Timothy [name]  11/05/2016   Your procedure is scheduled on: Wednesday November 06, 2016  Report to [facility]  Entrance take [place]  elevators to 3rd floor to  [place] at 8:00 AM.  Call this number if you have problems the morning of surgery [phone]   Remember: ONLY 1 PERSON MAY GO WITH YOU TO SHORT STAY TO GET  READY MORNING OF [place].  Do not eat food or drink liquids :After Midnight.     Take these medicines the morning of surgery with A SIP OF WATER: Cetirizine (Zyrtec) if needed                                 You may not have any metal on your body including hair pins and              piercings  Do not wear jewelry,  lotions, powders or colognes, deodorant                          Men may shave face and neck.   Do not bring valuables to the hospital. [place].  Contacts, dentures or bridgework may not be worn into surgery.       Patients discharged the day of surgery will not be allowed to drive home.  Name and phone number of your driver: [name] (wife) or [name] (mother in [employer])    _____________________________________________________________________             [facility] - Preparing for Surgery Before surgery, you can play an important role.  Because skin is not sterile, your skin needs to be as free of germs as possible.  You can reduce the number of germs on your skin by washing with CHG (chlorahexidine gluconate) soap before surgery.  CHG is an antiseptic cleaner which kills germs and bonds with the skin to continue killing germs even after washing. Please DO NOT use if you have an allergy to CHG or antibacterial soaps.  If your skin becomes reddened/irritated stop using the CHG and inform your nurse when you arrive at Short Stay. Do not shave (including legs and underarms) for at least 48 hours prior to the first CHG shower.  You may shave your  face/neck. Please follow these instructions carefully:  1.  Shower with CHG Soap the night before surgery and the  morning of Surgery.  2.  If you choose to wash your hair, wash your hair first as usual with your  normal  shampoo.  3.  After you shampoo, rinse your hair and body thoroughly to remove the  shampoo.                           4.  Use CHG as you would any other liquid soap.  You can apply chg directly  to the skin and wash                       Gently with a scrungie or clean washcloth.  5.  Apply the CHG Soap to your body ONLY FROM THE NECK DOWN.   Do not  use on face/ open                           Wound or open sores. Avoid contact with eyes, ears mouth and genitals (private parts).                       Wash face,  Genitals (private parts) with your normal soap.             6.  Wash thoroughly, paying special attention to the area where your surgery  will be performed.  7.  Thoroughly rinse your body with warm water from the neck down.  8.  DO NOT shower/wash with your normal soap after using and rinsing off  the CHG Soap.                9.  Pat yourself dry with a clean towel.            10.  Wear clean pajamas.            11.  Place clean sheets on your bed the night of your first shower and do not  sleep with pets. Day of Surgery : Do not apply any lotions/deodorants the morning of surgery.  Please wear clean clothes to the hospital/surgery center.  FAILURE TO FOLLOW THESE INSTRUCTIONS MAY RESULT IN THE CANCELLATION OF YOUR SURGERY PATIENT SIGNATURE_________________________________  NURSE SIGNATURE__________________________________  ________________________________________________________________________

## 2016-11-06 ENCOUNTER — Ambulatory Visit (HOSPITAL_COMMUNITY): Payer: 59 | Admitting: Anesthesiology

## 2016-11-06 ENCOUNTER — Encounter (HOSPITAL_COMMUNITY)
Admission: RE | Disposition: A | Discharge status: Home / Self Care | Payer: Self-pay | Source: Ambulatory Visit | Attending: Surgery

## 2016-11-06 ENCOUNTER — Ambulatory Visit (HOSPITAL_COMMUNITY)
Admission: RE | Admit: 2016-11-06 | Discharge: 2016-11-06 | Disposition: A | Discharge status: Home / Self Care | Payer: 59 | Source: Ambulatory Visit | Attending: Surgery | Admitting: Surgery

## 2016-11-06 ENCOUNTER — Encounter (HOSPITAL_COMMUNITY): Payer: Self-pay | Admitting: *Deleted

## 2016-11-06 DIAGNOSIS — D171 Benign lipomatous neoplasm of skin and subcutaneous tissue of trunk: Secondary | ICD-10-CM | POA: Insufficient documentation

## 2016-11-06 DIAGNOSIS — G4730 Sleep apnea, unspecified: Secondary | ICD-10-CM | POA: Insufficient documentation

## 2016-11-06 DIAGNOSIS — J45909 Unspecified asthma, uncomplicated: Secondary | ICD-10-CM | POA: Insufficient documentation

## 2016-11-06 DIAGNOSIS — I10 Essential (primary) hypertension: Secondary | ICD-10-CM | POA: Insufficient documentation

## 2016-11-06 DIAGNOSIS — Z7982 Long term (current) use of aspirin: Secondary | ICD-10-CM | POA: Insufficient documentation

## 2016-11-06 DIAGNOSIS — E7800 Pure hypercholesterolemia, unspecified: Secondary | ICD-10-CM | POA: Insufficient documentation

## 2016-11-06 DIAGNOSIS — Z79899 Other long term (current) drug therapy: Secondary | ICD-10-CM | POA: Insufficient documentation

## 2016-11-06 DIAGNOSIS — Z6837 Body mass index (BMI) 37.0-37.9, adult: Secondary | ICD-10-CM | POA: Insufficient documentation

## 2016-11-06 HISTORY — PX: LIPOMA EXCISION: SHX5283

## 2016-11-06 SURGERY — EXCISION LIPOMA
Anesthesia: General

## 2016-11-06 MED ORDER — LACTATED RINGERS IV SOLN
INTRAVENOUS | Status: DC
  Administered 2016-11-06: 1000 mL via INTRAVENOUS

## 2016-11-06 MED ORDER — BUPIVACAINE HCL (PF) 0.25 % IJ SOLN
INTRAMUSCULAR | Status: AC
  Filled 2016-11-06: qty 30

## 2016-11-06 MED ORDER — ONDANSETRON HCL 4 MG/2ML IJ SOLN
INTRAMUSCULAR | Status: AC
  Filled 2016-11-06: qty 2

## 2016-11-06 MED ORDER — DOCUSATE SODIUM 100 MG PO CAPS: 100.0000 mg | ORAL_CAPSULE | Freq: Two times a day (BID) | ORAL | 0 refills | Status: AC

## 2016-11-06 MED ORDER — CEFAZOLIN SODIUM-DEXTROSE 2-4 GM/100ML-% IV SOLN
INTRAVENOUS | Status: AC
  Filled 2016-11-06: qty 100

## 2016-11-06 MED ORDER — LIDOCAINE-EPINEPHRINE 0.5 %-1:200000 IJ SOLN
INTRAMUSCULAR | Status: DC | PRN
  Administered 2016-11-06: 10 mL

## 2016-11-06 MED ORDER — SODIUM CHLORIDE 0.9% FLUSH: 3.0000 mL | INTRAVENOUS | Status: DC | PRN

## 2016-11-06 MED ORDER — FENTANYL CITRATE (PF) 100 MCG/2ML IJ SOLN
INTRAMUSCULAR | Status: DC | PRN
Start: 2016-11-06 — End: 2016-11-06
  Administered 2016-11-06: 50 ug via INTRAVENOUS

## 2016-11-06 MED ORDER — LIDOCAINE 2% (20 MG/ML) 5 ML SYRINGE
INTRAMUSCULAR | Status: DC | PRN
  Administered 2016-11-06: 100 mg via INTRAVENOUS

## 2016-11-06 MED ORDER — CEFAZOLIN SODIUM-DEXTROSE 2-4 GM/100ML-% IV SOLN
2.0000 g | INTRAVENOUS | Status: AC
  Administered 2016-11-06: 2 g via INTRAVENOUS

## 2016-11-06 MED ORDER — PROPOFOL 500 MG/50ML IV EMUL
INTRAVENOUS | Status: DC | PRN
  Administered 2016-11-06: 140 ug/kg/min via INTRAVENOUS

## 2016-11-06 MED ORDER — FENTANYL CITRATE (PF) 100 MCG/2ML IJ SOLN: 25.0000 ug | INTRAMUSCULAR | Status: DC | PRN

## 2016-11-06 MED ORDER — LIDOCAINE-EPINEPHRINE 0.5 %-1:200000 IJ SOLN
INTRAMUSCULAR | Status: AC
  Filled 2016-11-06: qty 1

## 2016-11-06 MED ORDER — FENTANYL CITRATE (PF) 100 MCG/2ML IJ SOLN
INTRAMUSCULAR | Status: AC
  Filled 2016-11-06: qty 2

## 2016-11-06 MED ORDER — PROPOFOL 10 MG/ML IV BOLUS
INTRAVENOUS | Status: AC
Start: 2016-11-06 — End: 2016-11-06
  Filled 2016-11-06: qty 20

## 2016-11-06 MED ORDER — TRAMADOL HCL 50 MG PO TABS: 50.0000 mg | ORAL_TABLET | Freq: Four times a day (QID) | ORAL | 0 refills | Status: DC | PRN

## 2016-11-06 MED ORDER — LIDOCAINE 2% (20 MG/ML) 5 ML SYRINGE
INTRAMUSCULAR | Status: AC
  Filled 2016-11-06: qty 5

## 2016-11-06 MED ORDER — PROPOFOL 10 MG/ML IV BOLUS
INTRAVENOUS | Status: AC
  Filled 2016-11-06: qty 20

## 2016-11-06 MED ORDER — ONDANSETRON HCL 4 MG/2ML IJ SOLN
INTRAMUSCULAR | Status: DC | PRN
  Administered 2016-11-06: 4 mg via INTRAVENOUS

## 2016-11-06 MED ORDER — SODIUM CHLORIDE 0.9% FLUSH: 3.0000 mL | Freq: Two times a day (BID) | INTRAVENOUS | Status: DC

## 2016-11-06 MED ORDER — ACETAMINOPHEN 650 MG RE SUPP
650.0000 mg | RECTAL | Status: DC | PRN
  Filled 2016-11-06: qty 1

## 2016-11-06 MED ORDER — BUPIVACAINE HCL (PF) 0.5 % IJ SOLN
INTRAMUSCULAR | Status: AC
  Filled 2016-11-06: qty 30

## 2016-11-06 MED ORDER — ACETAMINOPHEN 325 MG PO TABS
650.0000 mg | ORAL_TABLET | ORAL | Status: DC | PRN
  Administered 2016-11-06: 650 mg via ORAL
  Filled 2016-11-06: qty 2

## 2016-11-06 MED ORDER — MIDAZOLAM HCL 2 MG/2ML IJ SOLN
INTRAMUSCULAR | Status: AC
  Filled 2016-11-06: qty 2

## 2016-11-06 MED ORDER — PROPOFOL 10 MG/ML IV BOLUS
INTRAVENOUS | Status: DC | PRN
  Administered 2016-11-06: 20 mg via INTRAVENOUS

## 2016-11-06 MED ORDER — MIDAZOLAM HCL 5 MG/5ML IJ SOLN
INTRAMUSCULAR | Status: DC | PRN
  Administered 2016-11-06: 2 mg via INTRAVENOUS

## 2016-11-06 MED ORDER — SODIUM CHLORIDE 0.9 % IV SOLN: 250.0000 mL | INTRAVENOUS | Status: DC | PRN

## 2016-11-06 SURGICAL SUPPLY — 34 items
BENZOIN TINCTURE PRP APPL 2/3 (GAUZE/BANDAGES/DRESSINGS) IMPLANT
BLADE HEX COATED 2.75 (ELECTRODE) ×3 IMPLANT
BLADE SURG 15 STRL LF DISP TIS (BLADE) ×1 IMPLANT
BLADE SURG 15 STRL SS (BLADE) ×2
COVER SURGICAL LIGHT HANDLE (MISCELLANEOUS) ×3 IMPLANT
DECANTER SPIKE VIAL GLASS SM (MISCELLANEOUS) IMPLANT
DERMABOND ADVANCED (GAUZE/BANDAGES/DRESSINGS) ×2
DERMABOND ADVANCED .7 DNX12 (GAUZE/BANDAGES/DRESSINGS) ×1 IMPLANT
DRAPE LAPAROTOMY T 102X78X121 (DRAPES) IMPLANT
DRAPE LAPAROTOMY T 98X78 PEDS (DRAPES) IMPLANT
ELECT PENCIL ROCKER SW 15FT (MISCELLANEOUS) ×3 IMPLANT
ELECT REM PT RETURN 9FT ADLT (ELECTROSURGICAL) ×3
ELECTRODE REM PT RTRN 9FT ADLT (ELECTROSURGICAL) ×1 IMPLANT
GAUZE PACKING IODOFORM 1/4X15 (GAUZE/BANDAGES/DRESSINGS) IMPLANT
GAUZE SPONGE 4X4 12PLY STRL (GAUZE/BANDAGES/DRESSINGS) ×3 IMPLANT
GLOVE BIO SURGEON STRL SZ 6.5 (GLOVE) ×2 IMPLANT
GLOVE BIO SURGEONS STRL SZ 6.5 (GLOVE) ×1
GLOVE BIOGEL M STRL SZ7.5 (GLOVE) IMPLANT
GLOVE BIOGEL PI IND STRL 7.0 (GLOVE) ×1 IMPLANT
GLOVE BIOGEL PI INDICATOR 7.0 (GLOVE) ×2
GOWN L4 XXLG W/PAP TWL (GOWN DISPOSABLE) ×3 IMPLANT
KIT BASIN OR (CUSTOM PROCEDURE TRAY) ×3 IMPLANT
MARKER SKIN DUAL TIP RULER LAB (MISCELLANEOUS) IMPLANT
NEEDLE HYPO 25X1 1.5 SAFETY (NEEDLE) ×3 IMPLANT
PACK BASIC VI WITH GOWN DISP (CUSTOM PROCEDURE TRAY) ×3 IMPLANT
SPONGE LAP 18X18 X RAY DECT (DISPOSABLE) IMPLANT
STAPLER VISISTAT 35W (STAPLE) IMPLANT
SUT MNCRL AB 4-0 PS2 18 (SUTURE) IMPLANT
SUT VIC AB 3-0 SH 18 (SUTURE) IMPLANT
SUT VIC AB 3-0 SH 27 (SUTURE) ×2
SUT VIC AB 3-0 SH 27XBRD (SUTURE) ×1 IMPLANT
SYR CONTROL 10ML LL (SYRINGE) ×3 IMPLANT
TOWEL OR 17X26 10 PK STRL BLUE (TOWEL DISPOSABLE) ×3 IMPLANT
YANKAUER SUCT BULB TIP 10FT TU (MISCELLANEOUS) IMPLANT

## 2016-11-06 NOTE — Discharge Instructions (Signed)
[place] Office Phone Number [phone]  POST OP INSTRUCTIONS  Always review your discharge instruction sheet given to you by the facility where your surgery was performed.  IF YOU HAVE DISABILITY OR FAMILY LEAVE FORMS, YOU MUST BRING THEM TO THE OFFICE FOR PROCESSING.  DO NOT GIVE THEM TO YOUR DOCTOR.  1. A prescription for pain medication may be given to you upon discharge.  Take your pain medication as prescribed, if needed.  If narcotic pain medicine is not needed, then you may take acetaminophen (Tylenol) or ibuprofen (Advil) as needed. 2. Take your usually prescribed medications unless otherwise directed 3. If you need a refill on your pain medication, please contact your pharmacy.  They will contact our office to request authorization.  Prescriptions will not be filled after 5pm or on week-ends. 4. You should eat very light the first 24 hours after surgery, such as soup, crackers, pudding, etc.  Resume your normal diet the day after surgery. 5. Most patients will experience some swelling and bruising in the surgical site.  Ice packs will help.  Swelling and bruising can take several days to resolve.  6. It is common to experience some constipation if taking pain medication after surgery.  Increasing fluid intake and taking a stool softener will usually help or prevent this problem from occurring.  A mild laxative (Milk of Magnesia or Miralax) should be taken according to package directions if there are no bowel movements after 48 hours. 7. Unless discharge instructions indicate otherwise, you may remove your bandages 24-48 hours after surgery, and you may shower at that time.  You may have steri-strips (small skin tapes) in place directly over the incision.  These strips should be left on the skin for 7-10 days.  If your surgeon used skin glue on the incision, you may shower in 24 hours.  The glue will flake off over the next 2-3 weeks.  Any sutures or staples will be  removed at the office during your follow-up visit. a. ACTIVITIES:  You may resume regular daily activities (gradually increasing) beginning the next day.  You may drive when you no longer are taking prescription pain medication, you can comfortably wear a seatbelt, and you can safely maneuver your car and apply brakes. b. RETURN TO WORK:  _1-2 days_____________________________________________________________________________________ 8. You should see your doctor in the office for a follow-up appointment approximately two weeks after your surgery.   9. OTHER INSTRUCTIONS: _______________________________________________________________________________________________ _____________________________________________________________________________________________________________________________________ _____________________________________________________________________________________________________________________________________ _____________________________________________________________________________________________________________________________________  WHEN TO CALL YOUR DOCTOR: 1. Fever over 101.0 2. Nausea and/or vomiting. 3. Extreme swelling or bruising. 4. Continued bleeding from incision. 5. Increased pain, redness, or drainage from the incision.  The clinic staff is available to answer your questions during regular business hours.  Please dont hesitate to call and ask to speak to one of the nurses for clinical concerns.  If you have a medical emergency, go to the nearest emergency room or call 911.  A surgeon from [facility] is always on call at the hospital.  For further questions, please visit [email or web address]

## 2016-11-06 NOTE — Op Note (Signed)
Operative Note  Gwin Hetchler  [id number]  [id number]  11/06/2016   Surgeon: Clovis Riley  Assistant: none  Procedure performed: excision abdominal wall lipoma, 2cm  Preop diagnosis:  lipoma  Post-op diagnosis/intraop findings: lipoma, subcutaneous, lesion 2cm in diameter  Specimens: lipoma  EBL: [id number]  Complications: none  Description of procedure: After obtaining informed consent the patient was taken to the operating room and placed supine on operating room table [facility] was initiated, preoperative antibiotics were administered, SCDs applied, and a formal timeout was performed. The abdomen was clipped and prepped and draped in the usual sterile fashion. A 2.5cm incision was made over the palpable mass after infiltrating the surrounding tissues with local anesthetic (0.5%lido with epi). Soft tissues were dissected using electrocautery and blunt dissection until the lipoma was delivered into the field and excised intact. Hemostasis was ensured in the wound with cautery. The skin was then reapproximated with interrupted deep dermal 3-0 vicryls and running subcuticular monocryl. Dermabond was applied. The patient was then awakened and taken to PACU in stable condition.   All counts were correct at the completion of the case

## 2016-11-06 NOTE — Anesthesia Postprocedure Evaluation (Addendum)
Anesthesia Post Note  Patient: Timothy [name]  Procedure(s) Performed: Procedure(s) (LRB): EXCISION LIPOMA ABDOMINAL WALL (N/A)  Patient location during evaluation: PACU Anesthesia Type: MAC Level of consciousness: awake and alert Pain management: pain level controlled Vital Signs Assessment: post-procedure vital signs reviewed and stable Respiratory status: spontaneous breathing, nonlabored ventilation, respiratory function stable and patient connected to nasal cannula oxygen Cardiovascular status: stable and blood pressure returned to baseline Anesthetic complications: no    Last Vitals:  Vitals:   11/06/16 1052 11/06/16 1102  BP: 117/68 136/75  Pulse:  (!) 56  Resp:    Temp: 36.4 C 36.4 C    Last Pain:  Vitals:   11/06/16 1116  TempSrc:   PainSc: 2                  Avangelina Flight,JAMES TERRILL

## 2016-11-06 NOTE — Anesthesia Preprocedure Evaluation (Addendum)
Anesthesia Evaluation  Patient identified by MRN, date of birth, ID band Patient awake    Reviewed: Allergy & Precautions, NPO status , Patient's Chart, lab work & pertinent test results  Airway Mallampati: II  TM Distance: <3 FB Neck ROM: Full    Dental  (+) Teeth Intact, Dental Advisory Given   Pulmonary asthma , sleep apnea ,    breath sounds clear to auscultation       Cardiovascular hypertension,  Rhythm:Regular Rate:Normal     Neuro/Psych negative neurological ROS     GI/Hepatic hiatal hernia,   Endo/Other  Morbid obesity  Renal/GU Renal InsufficiencyRenal disease     Musculoskeletal   Abdominal (+) + obese,   Peds  Hematology negative hematology ROS (+)   Anesthesia Other Findings   Reproductive/Obstetrics                             Anesthesia Physical Anesthesia Plan  ASA: III  Anesthesia Plan: MAC   Post-op Pain Management:    Induction: Intravenous  Airway Management Planned: Simple Face Mask and Natural Airway  Additional Equipment:   Intra-op Plan:   Post-operative Plan:   Informed Consent: I have reviewed the patients History and Physical, chart, labs and discussed the procedure including the risks, benefits and alternatives for the proposed anesthesia with the patient or authorized representative who has indicated his/her understanding and acceptance.   Dental advisory given  Plan Discussed with: CRNA  Anesthesia Plan Comments:        Anesthesia Quick Evaluation

## 2016-11-06 NOTE — Transfer of Care (Signed)
Immediate Anesthesia Transfer of Care Note  Patient: Timothy [name]  Procedure(s) Performed: Procedure(s): EXCISION LIPOMA ABDOMINAL WALL (N/A)  Patient Location: PACU  Anesthesia Type:MAC  Level of Consciousness: awake, alert  and oriented  Airway & Oxygen Therapy: Patient Spontanous Breathing and Patient connected to face mask oxygen  Post-op Assessment: Report given to RN and Post -op Vital signs reviewed and stable  Post vital signs: Reviewed and stable  Last Vitals:  Vitals:   11/06/16 0752  BP: 135/77  Pulse: 72  Resp: 18  Temp: 36.5 C    Last Pain:  Vitals:   11/06/16 0752  TempSrc: Oral      Patients Stated Pain Goal: 4 ([id number])  Complications: No apparent anesthesia complications

## 2016-11-06 NOTE — Interval H&P Note (Signed)
History and Physical Interval Note:  11/06/2016 9:28 AM  Timothy [name]  has presented today for surgery, with the diagnosis of LIPOMA ABDOMINAL WALL  The various methods of treatment have been discussed with the patient and family. After consideration of risks, benefits and other options for treatment, the patient has consented to  Procedure(s): EXCISION LIPOMA ABDOMINAL WALL (N/A) as a surgical intervention .  The patient's history has been reviewed, patient examined, no change in status, stable for surgery.  I have reviewed the patient's chart and labs.  Questions were answered to the patient's satisfaction.     Chelsea Rich Brave

## 2016-11-06 NOTE — H&P (View-Only) (Signed)
Timothy [name]. Timothy [name] 11/01/2016 1:14 PM Location: [facility] Patient #: [id number] DOB: 15-Jan-1961 Married / Language: English / Race: White Male  History of Present Illness (Jahanna Raether A. Whittney Steenson MD; 11/01/2016 3:05 PM) Patient words: Noticed a lump on his abdomen recently just near where he developed a pimple a couple of weeks ago. It does not cause any pain. It is not tender. He does not know if it is changed in size. He wants it removed to confirm that there is nothing malignant there.  The patient is a 55 year old male.   Other Problems Benjiman Core, CMA; 11/01/2016 1:32 PM) Arthritis Asthma Back Pain Chest pain Enlarged Prostate Gastroesophageal Reflux Disease Hypercholesterolemia Kidney Stone Sleep Apnea  Past Surgical History Benjiman Core, CMA; 11/01/2016 1:32 PM) Knee Surgery Bilateral. Oral Surgery  Diagnostic Studies History Benjiman Core, CMA; 11/01/2016 1:32 PM) Colonoscopy 1-5 years ago  Allergies Benjiman Core, CMA; 11/01/2016 1:34 PM) No Known Drug Allergies 11/01/2016  Medication History (Armen Eulas Post, CMA; 11/01/2016 1:36 PM) Pravastatin Sodium (80MG  Tablet, Oral) Active. Aspirin (81MG  Tablet, Oral) Active. Bisoprolol-Hydrochlorothiazide (5-6.25MG  Tablet, Oral) Active. ZyrTEC (10MG  Tablet, Oral) Active. Doxazosin Mesylate ER (4MG  Tablet ER 24HR, Oral) Active. Multivitamin Adult (Oral) Active. Aleve (220MG  Capsule, Oral) Active. Medications Reconciled  Social History Benjiman Core, CMA; 11/01/2016 1:32 PM) Alcohol use Occasional alcohol use. Caffeine use Coffee, Tea. No drug use Tobacco use Never smoker.  Family History Benjiman Core, CMA; 11/01/2016 1:32 PM) Bleeding disorder Daughter. Diabetes Mellitus Father. Heart Disease Brother, Daughter, Father, Mother. Heart disease in male family member before age 31 Hypertension Brother, Father. Respiratory Condition Daughter.     Review of Systems (Armen Eulas Post CMA;  11/01/2016 1:32 PM) General Not Present- Appetite Loss, Chills, Fatigue, Fever, Night Sweats, Weight Gain and Weight Loss. Skin Not Present- Change in Wart/Mole, Dryness, Hives, Jaundice, New Lesions, Non-Healing Wounds, Rash and Ulcer. HEENT Not Present- Earache, Hearing Loss, Hoarseness, Nose Bleed, Oral Ulcers, Ringing in the Ears, Seasonal Allergies, Sinus Pain, Sore Throat, Visual Disturbances, Wears glasses/contact lenses and Yellow Eyes. Respiratory Not Present- Bloody sputum, Chronic Cough, Difficulty Breathing, Snoring and Wheezing. Breast Not Present- Breast Mass, Breast Pain, Nipple Discharge and Skin Changes. Cardiovascular Present- Swelling of Extremities. Not Present- Chest Pain, Difficulty Breathing Lying Down, Leg Cramps, Palpitations, Rapid Heart Rate and Shortness of Breath. Gastrointestinal Not Present- Abdominal Pain, Bloating, Bloody Stool, Change in Bowel Habits, Chronic diarrhea, Constipation, Difficulty Swallowing, Excessive gas, Gets full quickly at meals, Hemorrhoids, Indigestion, Nausea, Rectal Pain and Vomiting. Male Genitourinary Not Present- Blood in Urine, Change in Urinary Stream, Frequency, Impotence, Nocturia, Painful Urination, Urgency and Urine Leakage. Musculoskeletal Present- Back Pain, Joint Stiffness and Swelling of Extremities. Not Present- Joint Pain, Muscle Pain and Muscle Weakness. Neurological Not Present- Decreased Memory, Fainting, Headaches, Numbness, Seizures, Tingling, Tremor, Trouble walking and Weakness. Psychiatric Not Present- Anxiety, Bipolar, Change in Sleep Pattern, Depression, Fearful and Frequent crying. Endocrine Not Present- Cold Intolerance, Excessive Hunger, Hair Changes, Heat Intolerance, Hot flashes and New Diabetes. Hematology Not Present- Blood Thinners, Easy Bruising, Excessive bleeding, Gland problems, HIV and Persistent Infections.  Vitals (Armen Glenn CMA; 11/01/2016 1:34 PM) 11/01/2016 1:33 PM Weight: 262.38 lb Height:  70in Body Surface Area: 2.34 m Body Mass Index: 37.65 kg/m  Temp.: 98.48F  Pulse: 69 (Regular)  P.OX: 96% (Room air) BP: 118/62 (Sitting, Left Arm, Standard)      Physical Exam (Spencer Cardinal A. Kae Heller MD; 11/01/2016 3:06 PM)  The physical exam findings are as follows: Note:Overweight Caucasian male sitting probably in  the exam chair Extraocular motion intact. Anicteric. Moist because membranes Neck without mass or thyromegaly Unlabored respirations. Clear bilaterally Obese abdomen, soft, nontender, nondistended. There is a 2 cm lipoma in the right mid abdominal wall superior to the umbilicus. No mass or organomegaly are palpable Extremities are warm without deformity Neuro grossly intact, normal gait Psych normal mood, anxious affect Skin no rashes or lesions    Assessment & Plan (Archita Lomeli A. 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 We will plan for excision under [facility] as an outpatient in the coming weeks.

## 2016-11-12 NOTE — Addendum Note (Signed)
Addendum  created 11/12/16 0820 by Rica Koyanagi, MD   Sign clinical note

## 2017-02-07 ENCOUNTER — Ambulatory Visit (INDEPENDENT_AMBULATORY_CARE_PROVIDER_SITE_OTHER): Payer: Self-pay | Admitting: Nurse Practitioner

## 2017-02-07 VITALS — BP 130/72 | HR 63 | Temp 99.1°F | Wt 256.0 lb

## 2017-02-07 DIAGNOSIS — Z0000 Encounter for general adult medical examination without abnormal findings: Secondary | ICD-10-CM

## 2017-02-07 MED ORDER — ALBUTEROL SULFATE HFA 108 (90 BASE) MCG/ACT IN AERS: 2.0000 | INHALATION_SPRAY | Freq: Four times a day (QID) | RESPIRATORY_TRACT | 1 refills | Status: DC | PRN

## 2017-02-07 NOTE — Addendum Note (Signed)
Addended by: Cari Caraway on: 02/07/2017 09:39 AM   Modules accepted: Orders

## 2017-02-07 NOTE — Progress Notes (Signed)
Subjective:  Timothy [name] is a 56 y.o. male who presents for basic physical exam. Patient denies any current health related concerns.  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  The patient has no complaints at this time.  The patient's past medical history, allergies and medications were reviewed.  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   Immunization History  Administered Date(s) Administered  . Td 08/22/2012    Past Medical History:  Diagnosis Date  . Asthma   . BPH (benign prostatic hyperplasia)   . History of hiatal hernia   . History of kidney stones   . Hyperlipidemia   . Hypertension   . Kidney stones   . Sleep apnea    CPAP 100%    Past Surgical History:  Procedure Laterality Date  . arm surgery Right 2005  . KNEE SURGERY Bilateral 1976  . LIPOMA EXCISION N/A 11/06/2016   Procedure: EXCISION LIPOMA ABDOMINAL WALL;  Surgeon: Clovis Riley, MD;  Location: [facility];  Service: General;  Laterality: N/A;  . NASAL SINUS SURGERY  2001  . 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 Smoking status: Never Smoker  . Smokeless tobacco: Never Used  . Alcohol use 0.0 oz/week     Comment: rare    No Known Allergies  Current Outpatient Prescriptions  Medication Sig Dispense Refill  . aspirin EC 81 MG tablet Take 81 mg by mouth daily.    . bisoprolol-hydrochlorothiazide (ZIAC) 5-6.25 MG per tablet Take 1 tablet by mouth daily.    . cetirizine (ZYRTEC) 10 MG tablet Take 10 mg by mouth daily.    Marland Kitchen doxazosin (CARDURA XL) 4 MG 24 hr tablet Take 4 mg by mouth daily with breakfast.    . Multiple Vitamin (MULTIVITAMIN) tablet Take 1 tablet by mouth daily.    . Naproxen Sodium (ALEVE PO) Take 1-2 tablets by mouth 2 (two) times daily as needed. As needed     . pravastatin (PRAVACHOL) 80 MG tablet Take 80 mg by mouth daily.    Marland Kitchen Phenyleph-Doxylamine-DM-APAP (NYQUIL  SEVERE COLD/FLU) 5-6.25-10-325 MG/15ML LIQD Take by mouth.    . Pseudoephedrine-APAP-DM (DAYQUIL MULTI-SYMPTOM COLD/FLU PO) Take by mouth.    . traMADol (ULTRAM) 50 MG tablet Take 1 tablet (50 mg total) by mouth every 6 (six) hours as needed. (Patient not taking: Reported on 02/07/2017) 30 tablet 0   No current facility-administered medications for this visit.     ROS  BP 130/72 (BP Location: Right Arm, Patient Position: Sitting, Cuff Size: Large)   Pulse 63   Temp 99.1 F (37.3 C) (Oral)   Wt 256 lb (116.1 kg)   SpO2 95%   BMI 36.73 kg/m    Objective:  BP 130/72 (BP Location: Right Arm, Patient Position: Sitting, Cuff Size: Large)   Pulse 63   Temp 99.1 F (37.3 C) (Oral)   Wt 256 lb (116.1 kg)   SpO2 95%   BMI 36.73 kg/m  General appearance: alert, cooperative and no distress Head: Normocephalic, without obvious abnormality, atraumatic Eyes: conjunctivae/corneas clear. PERRL, EOM's intact. Fundi benign. Ears: normal TM's and external ear canals both ears Nose: Nares normal. Septum midline. Mucosa normal. No drainage or sinus tenderness. Throat: lips, mucosa, and tongue normal; teeth and gums normal Neck: no adenopathy, no carotid bruit, no JVD, supple, symmetrical, trachea midline and thyroid not  enlarged, symmetric, no tenderness/mass/nodules Lungs: clear to auscultation bilaterally Heart: regular rate and rhythm, S1, S2 normal, no murmur, click, rub or gallop Abdomen: soft, non-tender; bowel sounds normal; no masses,  no organomegaly Extremities: extremities normal, atraumatic, no cyanosis or edema and history of bilateral knee surgery in high school and RUE tendon repair. Pulses:  L brachial 2+ R brachial 2+  L radial 2+ R radial 2+  L inguinal 2+ R inguinal 2+  L popliteal 2+ R popliteal 2+  L posterior tibial 2+ R posterior tibial 2+  L dorsalis pedis 2+ R dorsalis pedis 2+   Skin: Skin color, texture, turgor normal. No rashes or lesions Neurologic: Alert and  oriented X 3, normal strength and tone. Normal symmetric reflexes. Normal coordination and gait.  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  Patient takes Aleve as needed. Instructed patient to f/u with PCP regarding possible medication.+     Assessment:  basic physical exam    Plan:  Patient education provided.  No labs needed at this time. Patient will follow up with PCP.

## 2017-02-07 NOTE — Patient Instructions (Signed)
Hypertension Hypertension, commonly called high blood pressure, is when the force of blood pumping through the arteries is too strong. The arteries are the blood vessels that carry blood from the heart throughout the body. Hypertension forces the heart to work harder to pump blood and may cause arteries to become narrow or stiff. Having untreated or uncontrolled hypertension can cause heart attacks, strokes, kidney disease, and other problems. A blood pressure reading consists of a higher number over a lower number. Ideally, your blood pressure should be below 120/80. The first ("top") number is called the systolic pressure. It is a measure of the pressure in your arteries as your heart beats. The second ("bottom") number is called the diastolic pressure. It is a measure of the pressure in your arteries as the heart relaxes. What are the causes? The cause of this condition is not known. What increases the risk? Some risk factors for high blood pressure are under your control. Others are not. Factors you can change   Smoking.  Having type 2 diabetes mellitus, high cholesterol, or both.  Not getting enough exercise or physical activity.  Being overweight.  Having too much fat, sugar, calories, or salt (sodium) in your diet.  Drinking too much alcohol. Factors that are difficult or impossible to change   Having chronic kidney disease.  Having a family history of high blood pressure.  Age. Risk increases with age.  Race. You may be at higher risk if you are African-American.  Gender. Men are at higher risk than women before age 45. After age 65, women are at higher risk than men.  Having obstructive sleep apnea.  Stress. What are the signs or symptoms? Extremely high blood pressure (hypertensive crisis) may cause:  Headache.  Anxiety.  Shortness of breath.  Nosebleed.  Nausea and vomiting.  Severe chest pain.  Jerky movements you cannot control (seizures). How is this  diagnosed? This condition is diagnosed by measuring your blood pressure while you are seated, with your arm resting on a surface. The cuff of the blood pressure monitor will be placed directly against the skin of your upper arm at the level of your heart. It should be measured at least twice using the same arm. Certain conditions can cause a difference in blood pressure between your right and left arms. Certain factors can cause blood pressure readings to be lower or higher than normal (elevated) for a short period of time:  When your blood pressure is higher when you are in a health care provider's office than when you are at home, this is called white coat hypertension. Most people with this condition do not need medicines.  When your blood pressure is higher at home than when you are in a health care provider's office, this is called masked hypertension. Most people with this condition may need medicines to control blood pressure. If you have a high blood pressure reading during one visit or you have normal blood pressure with other risk factors:  You may be asked to return on a different day to have your blood pressure checked again.  You may be asked to monitor your blood pressure at home for 1 week or longer. If you are diagnosed with hypertension, you may have other blood or imaging tests to help your health care provider understand your overall risk for other conditions. How is this treated? This condition is treated by making healthy lifestyle changes, such as eating healthy foods, exercising more, and reducing your alcohol intake. Your health   care provider may prescribe medicine if lifestyle changes are not enough to get your blood pressure under control, and if:  Your systolic blood pressure is above 130.  Your diastolic blood pressure is above 80. Your personal target blood pressure may vary depending on your medical conditions, your age, and other factors. Follow these instructions  at home: Eating and drinking   Eat a diet that is high in fiber and potassium, and low in sodium, added sugar, and fat. An example eating plan is called the DASH (Dietary Approaches to Stop Hypertension) diet. To eat this way:  Eat plenty of fresh fruits and vegetables. Try to fill half of your plate at each meal with fruits and vegetables.  Eat whole grains, such as whole wheat pasta, brown rice, or whole grain bread. Fill about one quarter of your plate with whole grains.  Eat or drink low-fat dairy products, such as skim milk or low-fat yogurt.  Avoid fatty cuts of meat, processed or cured meats, and poultry with skin. Fill about one quarter of your plate with lean proteins, such as fish, chicken without skin, beans, eggs, and tofu.  Avoid premade and processed foods. These tend to be higher in sodium, added sugar, and fat.  Reduce your daily sodium intake. Most people with hypertension should eat less than 1,500 mg of sodium a day.  Limit alcohol intake to no more than 1 drink a day for nonpregnant women and 2 drinks a day for men. One drink equals 12 oz of beer, 5 oz of wine, or 1 oz of hard liquor. Lifestyle   Work with your health care provider to maintain a healthy body weight or to lose weight. Ask what an ideal weight is for you.  Get at least 30 minutes of exercise that causes your heart to beat faster (aerobic exercise) most days of the week. Activities may include walking, swimming, or biking.  Include exercise to strengthen your muscles (resistance exercise), such as pilates or lifting weights, as part of your weekly exercise routine. Try to do these types of exercises for 30 minutes at least 3 days a week.  Do not use any products that contain nicotine or tobacco, such as cigarettes and e-cigarettes. If you need help quitting, ask your health care provider.  Monitor your blood pressure at home as told by your health care provider.  Keep all follow-up visits as told by  your health care provider. This is important. Medicines   Take over-the-counter and prescription medicines only as told by your health care provider. Follow directions carefully. Blood pressure medicines must be taken as prescribed.  Do not skip doses of blood pressure medicine. Doing this puts you at risk for problems and can make the medicine less effective.  Ask your health care provider about side effects or reactions to medicines that you should watch for. Contact a health care provider if:  You think you are having a reaction to a medicine you are taking.  You have headaches that keep coming back (recurring).  You feel dizzy.  You have swelling in your ankles.  You have trouble with your vision. Get help right away if:  You develop a severe headache or confusion.  You have unusual weakness or numbness.  You feel faint.  You have severe pain in your chest or abdomen.  You vomit repeatedly.  You have trouble breathing. Summary  Hypertension is when the force of blood pumping through your arteries is too strong. If this condition is   not controlled, it may put you at risk for serious complications.  Your personal target blood pressure may vary depending on your medical conditions, your age, and other factors. For most people, a normal blood pressure is less than 120/80.  Hypertension is treated with lifestyle changes, medicines, or a combination of both. Lifestyle changes include weight loss, eating a healthy, low-sodium diet, exercising more, and limiting alcohol. This information is not intended to replace advice given to you by your health care provider. Make sure you discuss any questions you have with your health care provider. Document Released: 11/11/2005 Document Revised: 10/09/2016 Document Reviewed: 10/09/2016 Elsevier Interactive Patient Education  2017 [place] DASH stands for "Dietary Approaches to Stop Hypertension." The DASH eating  plan is a healthy eating plan that has been shown to reduce high blood pressure (hypertension). It may also reduce your risk for type 2 diabetes, heart disease, and stroke. The DASH eating plan may also help with weight loss. What are tips for following this plan? General guidelines   Avoid eating more than 2,300 mg (milligrams) of salt (sodium) a day. If you have hypertension, you may need to reduce your sodium intake to 1,500 mg a day.  Limit alcohol intake to no more than 1 drink a day for nonpregnant women and 2 drinks a day for men. One drink equals 12 oz of beer, 5 oz of wine, or 1 oz of hard liquor.  Work with your health care provider to maintain a healthy body weight or to lose weight. Ask what an ideal weight is for you.  Get at least 30 minutes of exercise that causes your heart to beat faster (aerobic exercise) most days of the week. Activities may include walking, swimming, or biking.  Work with your health care provider or diet and nutrition specialist (dietitian) to adjust your eating plan to your individual calorie needs. Reading food labels   Check food labels for the amount of sodium per serving. Choose foods with less than 5 percent of the Daily Value of sodium. Generally, foods with less than 300 mg of sodium per serving fit into this eating plan.  To find whole grains, look for the word "whole" as the first word in the ingredient list. Shopping   Buy products labeled as "low-sodium" or "no salt added."  Buy fresh foods. Avoid canned foods and premade or frozen meals. Cooking   Avoid adding salt when cooking. Use salt-free seasonings or herbs instead of table salt or sea salt. Check with your health care provider or pharmacist before using salt substitutes.  Do not fry foods. Cook foods using healthy methods such as baking, boiling, grilling, and broiling instead.  Cook with heart-healthy oils, such as olive, canola, soybean, or sunflower oil. Meal planning     Eat a balanced diet that includes:  5 or more servings of fruits and vegetables each day. At each meal, try to fill half of your plate with fruits and vegetables.  Up to 6-8 servings of whole grains each day.  Less than 6 oz of lean meat, poultry, or fish each day. A 3-oz serving of meat is about the same size as a deck of cards. One egg equals 1 oz.  2 servings of low-fat dairy each day.  A serving of nuts, seeds, or beans 5 times each week.  Heart-healthy fats. Healthy fats called Omega-3 fatty acids are found in foods such as flaxseeds and coldwater fish, like sardines, salmon, and mackerel.  Limit how much you eat of the following:  Canned or prepackaged foods.  Food that is high in trans fat, such as fried foods.  Food that is high in saturated fat, such as fatty meat.  Sweets, desserts, sugary drinks, and other foods with added sugar.  Full-fat dairy products.  Do not salt foods before eating.  Try to eat at least 2 vegetarian meals each week.  Eat more home-cooked food and less restaurant, buffet, and fast food.  When eating at a restaurant, ask that your food be prepared with less salt or no salt, if possible. What foods are recommended? The items listed may not be a complete list. Talk with your dietitian about what dietary choices are best for you. Grains  Whole-grain or whole-wheat bread. Whole-grain or whole-wheat pasta. Brown rice. [place]. Bulgur. Whole-grain and low-sodium cereals. Pita bread. Low-fat, low-sodium crackers. Whole-wheat flour tortillas. Vegetables  Fresh or frozen vegetables (raw, steamed, roasted, or grilled). Low-sodium or reduced-sodium tomato and vegetable juice. Low-sodium or reduced-sodium tomato sauce and tomato paste. Low-sodium or reduced-sodium canned vegetables. Fruits  All fresh, dried, or frozen fruit. Canned fruit in natural juice (without added sugar). Meat and other protein foods  Skinless chicken or [place]. Ground  chicken or [place]. Pork with fat trimmed off. Fish and seafood. Egg whites. Dried beans, peas, or lentils. Unsalted nuts, nut butters, and seeds. Unsalted canned beans. Lean cuts of beef with fat trimmed off. Low-sodium, lean deli meat. Dairy  Low-fat (1%) or fat-free (skim) milk. Fat-free, low-fat, or reduced-fat cheeses. Nonfat, low-sodium ricotta or cottage cheese. Low-fat or nonfat yogurt. Low-fat, low-sodium cheese. Fats and oils  Soft margarine without trans fats. Vegetable oil. Low-fat, reduced-fat, or light mayonnaise and salad dressings (reduced-sodium). Canola, safflower, olive, soybean, and sunflower oils. Avocado. Seasoning and other foods  Herbs. Spices. Seasoning mixes without salt. Unsalted popcorn and pretzels. Fat-free sweets. What foods are not recommended? The items listed may not be a complete list. Talk with your dietitian about what dietary choices are best for you. Grains  Baked goods made with fat, such as croissants, muffins, or some breads. Dry pasta or rice meal packs. Vegetables  Creamed or fried vegetables. Vegetables in a cheese sauce. Regular canned vegetables (not low-sodium or reduced-sodium). Regular canned tomato sauce and paste (not low-sodium or reduced-sodium). Regular tomato and vegetable juice (not low-sodium or reduced-sodium). Angie Fava. Olives. Fruits  Canned fruit in a light or heavy syrup. Fried fruit. Fruit in cream or butter sauce. Meat and other protein foods  Fatty cuts of meat. Ribs. Fried meat. Berniece Salines. Sausage. Bologna and other processed lunch meats. Salami. Fatback. Hotdogs. Bratwurst. Salted nuts and seeds. Canned beans with added salt. Canned or smoked fish. Whole eggs or egg yolks. Chicken or [place] with skin. Dairy  Whole or 2% milk, cream, and half-and-half. Whole or full-fat cream cheese. Whole-fat or sweetened yogurt. Full-fat cheese. Nondairy creamers. Whipped toppings. Processed cheese and cheese spreads. Fats and oils  Butter. Stick  margarine. Lard. Shortening. Ghee. Bacon fat. Tropical oils, such as coconut, palm kernel, or palm oil. Seasoning and other foods  Salted popcorn and pretzels. Onion salt, garlic salt, seasoned salt, table salt, and sea salt. Worcestershire sauce. Tartar sauce. Barbecue sauce. Teriyaki sauce. Soy sauce, including reduced-sodium. Steak sauce. Canned and packaged gravies. Fish sauce. Oyster sauce. Cocktail sauce. Horseradish that you find on the shelf. Ketchup. Mustard. Meat flavorings and tenderizers. Bouillon cubes. Hot sauce and Tabasco sauce. Premade or packaged marinades. Premade or packaged taco seasonings. Relishes.  Regular salad dressings. Where to find more information:  National Heart, Lung, and [place]: https://wilson-eaton.com/  American Heart Association: www.heart.org Summary  The DASH eating plan is a healthy eating plan that has been shown to reduce high blood pressure (hypertension). It may also reduce your risk for type 2 diabetes, heart disease, and stroke.  With the DASH eating plan, you should limit salt (sodium) intake to 2,300 mg a day. If you have hypertension, you may need to reduce your sodium intake to 1,500 mg a day.  When on the DASH eating plan, aim to eat more fresh fruits and vegetables, whole grains, lean proteins, low-fat dairy, and heart-healthy fats.  Work with your health care provider or diet and nutrition specialist (dietitian) to adjust your eating plan to your individual calorie needs. This information is not intended to replace advice given to you by your health care provider. Make sure you discuss any questions you have with your health care provider. Document Released: 10/31/2011 Document Revised: 11/04/2016 Document Reviewed: 11/04/2016 Elsevier Interactive Patient Education  2017 [place].  Acute Coronary Syndrome Acute coronary syndrome (ACS) is a serious problem in which there is suddenly not enough blood and oxygen supplied to the heart. ACS  may mean that one or more of the blood vessels in your heart (coronary arteries) may be blocked. ACS can result in chest pain or a heart attack (myocardial infarction or MI). What are the causes? This condition is caused by atherosclerosis, which is the buildup of fat and cholesterol (plaque) on the inside of the arteries. Over time, the plaque may narrow or block the artery, and this will lessen blood flow to the heart. Plaque can also become weak and break off within a coronary artery to form a clot and cause a sudden blockage. What increases the risk? The risk factors of this condition include:  High cholesterol levels.  High blood pressure (hypertension).  Smoking.  Diabetes.  Age.  Family history of chest pain, heart disease, or stroke.  Lack of exercise. What are the signs or symptoms? The most common signs of this condition include:  Chest pain, which can be:  A crushing or squeezing in the chest.  A tightness, pressure, fullness, or heaviness in the chest.  Present for more than a few minutes, or it can stop and recur.  Pain in the arms, neck, jaw, or back.  Unexplained heartburn or indigestion.  Shortness of breath.  Nausea.  Sudden cold sweats.  Feeling light-headed or dizzy. Sometimes, this condition has no symptoms. How is this diagnosed? ACS may be diagnosed through the following tests:  Electrocardiogram (ECG).  Blood tests.  Coronary angiogram. This is a procedure to look at the coronary arteries to see if there is any blockage. How is this treated? Treatment for ACS may include:  Healthy behavioral changes to reduce or control risk factors.  Medicine.  Coronary stenting.A stent helps to keep an artery open.  Coronary angioplasty. This procedure widens a narrowed or blocked artery.  Coronary artery bypass surgery. This will allow your blood to pass the blockage (bypass) to reach your heart. Follow these instructions at home: Eating and  drinking   Follow a heart-healthy diet. A dietitian can you help to educate you about healthy food options and changes.  Use healthy cooking methods such as roasting, grilling, broiling, baking, poaching, steaming, or stir-frying. Talk to a dietitian to learn more about healthy cooking methods. Medicines   Take medicines only as directed by your health  care provider.  Do not take the following medicines unless your health care provider approves:  Nonsteroidal anti-inflammatory drugs (NSAIDs), such as ibuprofen, naproxen, or celecoxib.  Vitamin supplements that contain vitamin A, vitamin E, or both.  Hormone replacement therapy that contains estrogen with or without progestin.  Stop illegal drug use. Activity   Follow an exercise program that is approved by your health care provider.  Plan rest periods when you are fatigued. Lifestyle   Do not use any tobacco products, including cigarettes, chewing tobacco, or electronic cigarettes. If you need help quitting, ask your health care provider.  If you drink alcohol, and your health care provider approves, limit your alcohol intake to no more than 1 drink per day. One drink equals 12 ounces of beer, 5 ounces of wine, or 1 ounces of hard liquor.  Learn to manage stress.  Maintain a healthy weight. Lose weight as approved by your health care provider. General instructions   Manage other health conditions, such as hypertension and diabetes, as directed by your health care provider.  Keep all follow-up visits as directed by your health care provider. This is important.  Your health care provider may ask you to monitor your blood pressure. A blood pressure reading consists of a higher number over a lower number, such as 110 over 72, written as 110/72. Ideally, your blood pressure should be:  Below 140/90 if you have no other medical conditions.  Below 130/80 if you have diabetes or kidney disease. Get help right away if:  You have  pain in your chest, neck, arm, jaw, stomach, or back that lasts more than a few minutes, is recurring, or is not relieved by taking medicine under your tongue (sublingual nitroglycerin).  You have profuse sweating without cause.  You have unexplained:  Heartburn or indigestion.  Shortness of breath or difficulty breathing.  Nausea or vomiting.  Fatigue.  Feelings of nervousness or anxiety.  Weakness.  Diarrhea.  You have sudden light-headedness or dizziness.  You faint. These symptoms may represent a serious problem that is an emergency. Do not wait to see if the symptoms will go away. Get medical help right away. Call your local emergency services (911 in the [place] Do not drive yourself to the clinic or hospital. This information is not intended to replace advice given to you by your health care provider. Make sure you discuss any questions you have with your health care provider. Document Released: 11/11/2005 Document Revised: 04/24/2016 Document Reviewed: 03/15/2014 Elsevier Interactive Patient Education  2017 [employer].

## 2017-02-21 ENCOUNTER — Encounter (HOSPITAL_COMMUNITY): Payer: Self-pay | Admitting: Emergency Medicine

## 2017-02-21 DIAGNOSIS — R079 Chest pain, unspecified: Secondary | ICD-10-CM | POA: Diagnosis present

## 2017-02-21 DIAGNOSIS — Z7982 Long term (current) use of aspirin: Secondary | ICD-10-CM | POA: Diagnosis not present

## 2017-02-21 DIAGNOSIS — I10 Essential (primary) hypertension: Secondary | ICD-10-CM | POA: Diagnosis not present

## 2017-02-21 DIAGNOSIS — Z79899 Other long term (current) drug therapy: Secondary | ICD-10-CM | POA: Diagnosis not present

## 2017-02-21 DIAGNOSIS — R0602 Shortness of breath: Secondary | ICD-10-CM | POA: Insufficient documentation

## 2017-02-21 DIAGNOSIS — J45909 Unspecified asthma, uncomplicated: Secondary | ICD-10-CM | POA: Diagnosis not present

## 2017-02-21 DIAGNOSIS — K449 Diaphragmatic hernia without obstruction or gangrene: Secondary | ICD-10-CM | POA: Insufficient documentation

## 2017-02-21 LAB — CBC
HCT: 40.6 % (ref 39.0–52.0)
HEMOGLOBIN: 13.2 g/dL (ref 13.0–17.0)
MCH: 26.2 pg (ref 26.0–34.0)
MCHC: 32.5 g/dL (ref 30.0–36.0)
MCV: 80.7 fL (ref 78.0–100.0)
Platelets: 263 10*3/uL (ref 150–400)
RBC: 5.03 MIL/uL (ref 4.22–5.81)
RDW: 14.1 % (ref 11.5–15.5)
WBC: 7.4 10*3/uL (ref 4.0–10.5)

## 2017-02-21 LAB — I-STAT TROPONIN, ED: Troponin i, poc: 0 ng/mL (ref 0.00–0.08)

## 2017-02-21 NOTE — ED Triage Notes (Signed)
Pt to ED from home c/o R sided CP radiating to L armpit, R arm, back, and neck x 2 days accompanied by intermittent SOB, dizziness, lightheadedness, and tingling in L arm. Pt states it began 2 days ago but worsened today. Drove down to [place] from [place] today, reports taking little breaks during the drive. Pt has strong family hx of early MI - father had first MI at 86 and brother died at age 58 of MI. Resp e/u.

## 2017-02-22 ENCOUNTER — Emergency Department (HOSPITAL_COMMUNITY): Payer: 59

## 2017-02-22 ENCOUNTER — Encounter (HOSPITAL_COMMUNITY): Payer: Self-pay | Admitting: Radiology

## 2017-02-22 ENCOUNTER — Emergency Department (HOSPITAL_COMMUNITY)
Admission: EM | Admit: 2017-02-22 | Discharge: 2017-02-22 | Disposition: A | Discharge status: Home / Self Care | Payer: 59 | Attending: Emergency Medicine | Admitting: Emergency Medicine

## 2017-02-22 DIAGNOSIS — K449 Diaphragmatic hernia without obstruction or gangrene: Secondary | ICD-10-CM

## 2017-02-22 DIAGNOSIS — R072 Precordial pain: Secondary | ICD-10-CM

## 2017-02-22 HISTORY — DX: Polyneuropathy, unspecified: G62.9

## 2017-02-22 LAB — BASIC METABOLIC PANEL
ANION GAP: 8 (ref 5–15)
BUN: 15 mg/dL (ref 6–20)
CHLORIDE: 105 mmol/L (ref 101–111)
CO2: 26 mmol/L (ref 22–32)
Calcium: 9 mg/dL (ref 8.9–10.3)
Creatinine, Ser: 1.11 mg/dL (ref 0.61–1.24)
GFR calc non Af Amer: >60 mL/min (ref >60)
Glucose, Bld: 134 mg/dL — ABNORMAL HIGH (ref 65–99)
POTASSIUM: 3.8 mmol/L (ref 3.5–5.1)
SODIUM: 139 mmol/L (ref 135–145)

## 2017-02-22 LAB — BRAIN NATRIURETIC PEPTIDE: B Natriuretic Peptide: 18.2 pg/mL (ref 0.0–100.0)

## 2017-02-22 LAB — I-STAT TROPONIN, ED: TROPONIN I, POC: 0 ng/mL (ref 0.00–0.08)

## 2017-02-22 MED ORDER — RANITIDINE HCL 150 MG PO TABS: 150.0000 mg | ORAL_TABLET | Freq: Two times a day (BID) | ORAL | 0 refills | Status: DC

## 2017-02-22 MED ORDER — IOPAMIDOL (ISOVUE-370) INJECTION 76%
INTRAVENOUS | Status: AC
  Administered 2017-02-22: 100 mL
  Filled 2017-02-22: qty 100

## 2017-02-22 NOTE — Discharge Instructions (Signed)
1. Medications: Zantac, usual home medications 2. Treatment: rest, drink plenty of fluids,  3. Follow Up: Please followup with radiology and gastroenterology in 3-5 days for discussion of your diagnoses and further evaluation after today's visit; if you do not have a primary care doctor use the resource guide provided to find one; Please return to the ER for worsening symptoms, symptoms that are associated with nausea, vomiting, weakness, sweating or other concerns.

## 2017-02-22 NOTE — ED Notes (Signed)
Pt reports chest pain x2 days and today it got worse. Pt reports that back in July '17 he was diagnosed with a hiatal hernia. Pt states that today the pain started to radiate down his left arm.

## 2017-02-22 NOTE — ED Provider Notes (Signed)
[facility] DEPT Provider Note   CSN: [id number] Arrival date & time: 02/21/17  2322     History   Chief Complaint Chief Complaint  Timothy [name] presents with  . Chest Pain    HPI Timothy [name] is a 56 y.o. male with a hx of Asthma, BPH, hiatal hernia, kidney stones, hypertension, peripheral neuropathy, sleep apnea presents to the Emergency Department complaining of gradual, persistent, progressively worsening right-sided chest pain that radiates across his chest and into the left armpit, down the right arm and through to his back as well as up to his neck onset 2 days ago but worsening today.. Associated symptoms include rest of breath, dizziness, lightheadedness, left arm paresthesias.  No aggravating or alleviating factors. Timothy [name] reports low-grade fever of 99.3 yesterday but no other infectious symptoms. Timothy [name] denies diaphoresis, nausea, vomiting, weakness, dizziness, syncope.  Timothy [name] reports a family history of early cardiac disease including his father who had his first MI at age 77 and a brother who died at age 40 of an MI.  Timothy [name] reports Timothy [name] was traveling last week and very careful with his diet however gained approximately 6 pounds over the week.   Record review shows Timothy [name] had outside CT of his chest on 08/07/16 that was negative except for moderate esophageal hiatal hernia. Cardiac markers were negative.   Timothy [name] then underwent a nuclear stress test on 08/08/16 that showed no ischemia, normal EF.  Timothy [name] has never had a cardiac cath.  The history is provided by the Timothy [name] and medical records. No language interpreter was used.    Past Medical History:  Diagnosis Date  . Asthma   . BPH (benign prostatic hyperplasia)   . History of hiatal hernia   . History of kidney stones   . Hyperlipidemia   . Hypertension   . Kidney stones   . Peripheral neuropathy ([place])   . Sleep apnea    CPAP 100%    Timothy [name] Active Problem List   Diagnosis Date Noted  . Chest pain with moderate risk of acute coronary  syndrome 07/01/2014  . Family history of coronary artery disease in brother 07/01/2014  . Sleep apnea 07/01/2014  . Obesity-  07/01/2014  . Dyspnea on exertion 07/01/2014  . HTN (hypertension) 07/01/2014  . Dyslipidemia 07/01/2014    Past Surgical History:  Procedure Laterality Date  . arm surgery Right 2005  . KNEE SURGERY Bilateral 1976  . LIPOMA EXCISION N/A 11/06/2016   Procedure: EXCISION LIPOMA ABDOMINAL WALL;  Surgeon: Clovis Riley, MD;  Location: [facility];  Service: General;  Laterality: N/A;  . NASAL SINUS SURGERY  2001  . 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 Authorizing Provider  albuterol (PROVENTIL HFA;VENTOLIN HFA) 108 (90 Base) MCG/ACT inhaler Inhale 2 puffs into the lungs every 6 (six) hours as needed for wheezing or shortness of breath (sob). 02/07/17 03/09/17 Yes Kara Dies, NP  aspirin EC 81 MG tablet Take 81 mg by mouth daily.   Yes Historical Provider, MD  bisoprolol-hydrochlorothiazide (ZIAC) 5-6.25 MG per tablet Take 1 tablet by mouth daily.   Yes Historical Provider, MD  cetirizine (ZYRTEC) 10 MG tablet Take 10 mg by mouth daily as needed for allergies.    Yes Historical Provider, MD  doxazosin (CARDURA XL) 4 MG 24 hr tablet Take 4 mg by mouth daily with breakfast.  Yes Historical Provider, MD  Multiple Vitamin (MULTIVITAMIN) tablet Take 1 tablet by mouth daily.   Yes Historical Provider, MD  Naproxen Sodium (ALEVE PO) Take 1-2 tablets by mouth 2 (two) times daily as needed. As needed    Yes Historical Provider, MD  pravastatin (PRAVACHOL) 80 MG tablet Take 80 mg by mouth daily.   Yes Historical Provider, MD  ranitidine (ZANTAC) 150 MG tablet Take 1 tablet (150 mg total) by mouth 2 (two) times daily. 02/22/17   Jarrett Soho Bryceson Grape, PA-C    Family History Family History  Problem Relation Age of Onset  . Heart disease Father   .  Diabetes Father   . Cancer Father     skin  . Heart disease Paternal Grandmother   . Valvular heart disease Brother   . Heart disease Brother   . Chiari malformation Daughter   . Heart disease Paternal Grandfather     Social History Social History  Substance Use Topics  . Smoking status: Never Smoker  . Smokeless tobacco: Never Used  . Alcohol use 0.0 oz/week     Comment: rare     Allergies   Timothy [name] has no known allergies.   Review of Systems Review of Systems  Cardiovascular: Positive for chest pain.  Musculoskeletal: Positive for back pain ( upper).  Neurological: Positive for numbness (paresthesias of the hands).  All other systems reviewed and are negative.    Physical Exam Updated Vital Signs BP 131/78   Pulse (!) 59   Temp 97.7 F (36.5 C) (Oral)   Resp 20   SpO2 96%   Physical Exam  Constitutional: Timothy [name] appears well-developed and well-nourished. No distress.  Awake, alert, nontoxic appearance  HENT:  Head: Normocephalic and atraumatic.  Mouth/Throat: Oropharynx is clear and moist. No oropharyngeal exudate.  Eyes: Conjunctivae are normal. No scleral icterus.  Neck: Normal range of motion. Neck supple.  Cardiovascular: Normal rate, regular rhythm and intact distal pulses.   Pulmonary/Chest: Effort normal and breath sounds normal. No respiratory distress. Timothy [name] has no wheezes.  Equal chest expansion  Abdominal: Soft. Bowel sounds are normal. Timothy [name] exhibits no mass. There is no tenderness. There is no rebound and no guarding.  Musculoskeletal: Normal range of motion. Timothy [name] exhibits no edema.  No midline or paraspinal tenderness to the T-spine or L-spine  Neurological: Timothy [name] is alert.  Speech is clear and goal oriented Moves extremities without ataxia  Skin: Skin is warm and dry. Timothy [name] is not diaphoretic.  Psychiatric: Timothy [name] has a normal mood and affect.  Nursing note and vitals reviewed.    ED Treatments / Results  Labs (all labs ordered are listed, but only  abnormal results are displayed) Labs Reviewed  BASIC METABOLIC PANEL - Abnormal; Notable for the following:       Result Value   Glucose, Bld 134 (*)    All other components within normal limits  CBC  BRAIN NATRIURETIC PEPTIDE  I-STAT TROPOININ, ED  I-STAT TROPOININ, ED    EKG  EKG Interpretation  Date/Time:  Friday February 21 2017 23:28:01 EDT Ventricular Rate:  77 PR Interval:  130 QRS Duration: 86 QT Interval:  386 QTC Calculation: 436 R Axis:   51 Text Interpretation:  Normal sinus rhythm Possible Left atrial enlargement Nonspecific ST and T wave abnormality Abnormal ECG No old tracing to compare Confirmed by Glynn Octave [phone]) on 02/22/2017 2:33:27 AM       Radiology Dg Chest 2 View  Result Date: 02/22/2017 CLINICAL DATA:  Chest pain  and shortness of breath. EXAM: CHEST  2 VIEW COMPARISON:  None. FINDINGS: Heart is upper limits normal in size. There is a retrocardiac hiatal hernia. Mild elevation of left hilum like to secondary to hiatal hernia. Central bronchial thickening. Pulmonary vasculature is normal. No consolidation, large pleural effusion, or pneumothorax. No acute osseous abnormalities are seen. IMPRESSION: Central bronchial thickening. Hiatal hernia. Electronically Signed   By: Jeb Levering M.D.   On: 02/22/2017 00:16   Ct Angio Chest/abd/pel For Dissection W And/or Wo Contrast  Result Date: 02/22/2017 CLINICAL DATA:  Chest pain radiating into the arms. Left arm tingling. EXAM: CT ANGIOGRAPHY CHEST, ABDOMEN AND PELVIS TECHNIQUE: Multidetector CT imaging through the chest, abdomen and pelvis was performed using the standard protocol during bolus administration of intravenous contrast. Multiplanar reconstructed images and MIPs were obtained and reviewed to evaluate the vascular anatomy. CONTRAST:  100 cc Isovue 370 IV COMPARISON:  Chest radiographs earlier this day FINDINGS: CTA CHEST FINDINGS Cardiovascular: No aortic dissection, hyperdense hematoma or  aortic aneurysm. No significant atherosclerosis. Minimal fluid in the superior mediastinum about the distal ascending aorta measures simple fluid density and is felt to be fluid in the superior pericardial recess. Minimal pericardial fluid inferiorly adjacent to the right ventricle. No aortic wall irregularity. No filling defects in the central pulmonary arteries to suggest pulmonary embolus. The heart is normal in size. No coronary artery calcifications are seen. Mediastinum/Nodes: Small to moderate-sized hiatal hernia. No mediastinal or hilar adenopathy. Visualized thyroid gland is normal. Lungs/Pleura: Central bronchial thickening. No consolidation. No evidence of pulmonary edema. Mild dependent atelectasis with trace adjacent effusions. Musculoskeletal: There are no acute or suspicious osseous abnormalities. Review of the MIP images confirms the above findings. CTA ABDOMEN AND PELVIS FINDINGS VASCULAR Aorta: Normal caliber aorta without aneurysm, dissection, vasculitis or significant stenosis. Celiac: Patent without evidence of aneurysm, dissection, vasculitis or significant stenosis. SMA: Patent without evidence of aneurysm, dissection, vasculitis or significant stenosis. Renals: Both renal arteries are patent without evidence of aneurysm, dissection, vasculitis, fibromuscular dysplasia or significant stenosis. Tiny accessory left upper pole renal artery. IMA: Patent without evidence of aneurysm, dissection, vasculitis or significant stenosis. Inflow: Patent without evidence of aneurysm, dissection, vasculitis or significant stenosis. Veins: No obvious venous abnormality within the limitations of this arterial phase study. Review of the MIP images confirms the above findings. NON-VASCULAR Hepatobiliary: No focal liver abnormality is seen. No gallstones, gallbladder wall thickening, or biliary dilatation. Pancreas: No ductal dilatation or inflammation. Spleen: Normal in size without focal abnormality.  Adrenals/Urinary Tract: Normal adrenal glands. No hydronephrosis or perinephric edema. Symmetric renal enhancement. Simple cyst in the lower left kidney. Urinary bladder is minimally distended. Stomach/Bowel: Stomach is within normal limits. Appendix appears normal. No evidence of bowel wall thickening, distention, or inflammatory changes. Lymphatic: Faint mesenteric stranding and small lymph nodes in the left central abdomen, nonspecific. No pathologically enlarged lymph nodes in the abdomen or pelvis. Reproductive: Prostatic calcifications. Other: Tiny fat containing umbilical hernia. Fat within both inguinal canals. No free air, free fluid, or intra-abdominal fluid collection. Musculoskeletal: No acute or significant osseous findings. Review of the MIP images confirms the above findings. IMPRESSION: 1. No aortic dissection. Simple periaortic fluid about the distal ascending aorta is felt to be pericardial fluid in the superior pericardial recess. 2. Small to moderate hiatal hernia. 3. Bronchial thickening. 4. Mild mesenteric stranding and small lymph nodes in the left central mesentery, nonspecific, likely reactive. Electronically Signed   By: Jeb Levering M.D.   On: 02/22/2017 02:28  Procedures Procedures (including critical care time)  Medications Ordered in ED Medications  iopamidol (ISOVUE-370) 76 % injection (100 mLs  Contrast Given 02/22/17 0148)     Initial Impression / Assessment and Plan / ED Course  I have reviewed the triage vital signs and the nursing notes.  Pertinent labs & imaging results that were available during my care of the Timothy [name] were reviewed by me and considered in my medical decision making (see chart for details).     Timothy [name] presents with centralized chest pain that radiates to the bilateral arms and back. Timothy [name] reports this time each chest pain-free and feels significantly better. Troponin negative 2, BNP negative, no anemia. CT scan without evidence of PE or  dissection. No evidence of pneumothorax or Boerhaave's. Highly doubt ACS. Question possible GERD as Timothy [name] has a history of hiatal hernia. Timothy [name] has never taken medications for GERD.  Discussed findings with Timothy [name]. Will discharge to home with cardiology and gastroenterology follow-up.  Timothy [name] states understanding and is in agreement with the plan. Discussed reasons to return immediately to the emergency department.  The Timothy [name] was discussed with and ECG and CT scan reviewed by  Dr. Claudine Mouton who agrees with the treatment plan.   Final Clinical Impressions(s) / ED Diagnoses   Final diagnoses:  Precordial pain  Hiatal hernia    New Prescriptions New Prescriptions   RANITIDINE (ZANTAC) 150 MG TABLET    Take 1 tablet (150 mg total) by mouth 2 (two) times daily.     Jarrett Soho Anhelica Fowers, PA-C 02/22/17 [place], MD 02/22/17 [phone]

## 2017-03-03 ENCOUNTER — Encounter: Payer: Self-pay | Admitting: Gastroenterology

## 2017-03-04 ENCOUNTER — Encounter: Payer: Self-pay | Admitting: *Deleted

## 2017-03-17 ENCOUNTER — Ambulatory Visit (INDEPENDENT_AMBULATORY_CARE_PROVIDER_SITE_OTHER): Payer: 59 | Admitting: Gastroenterology

## 2017-03-17 ENCOUNTER — Encounter (INDEPENDENT_AMBULATORY_CARE_PROVIDER_SITE_OTHER): Payer: Self-pay

## 2017-03-17 ENCOUNTER — Encounter: Payer: Self-pay | Admitting: Gastroenterology

## 2017-03-17 VITALS — BP 96/60 | HR 64 | Ht 70.0 in | Wt 255.5 lb

## 2017-03-17 DIAGNOSIS — R1310 Dysphagia, unspecified: Secondary | ICD-10-CM | POA: Diagnosis not present

## 2017-03-17 DIAGNOSIS — K449 Diaphragmatic hernia without obstruction or gangrene: Secondary | ICD-10-CM | POA: Diagnosis not present

## 2017-03-17 DIAGNOSIS — R12 Heartburn: Secondary | ICD-10-CM | POA: Diagnosis not present

## 2017-03-17 NOTE — Patient Instructions (Signed)
Start omeprazole 20mg  pill, OTC, take one pill 20-30 min before BF meal. You will be set up for an upper endoscopy for dysphagia, pyrosis.

## 2017-03-17 NOTE — Progress Notes (Signed)
HPI: This is a  very pleasant 56 year old man  who was referred to me by Aura Dials, MD  to evaluate  chest pain, pyrosis, dysphagia .    Chief complaint is pyrosis, dysphagia, intermittent chest pain  Chest pains, while traveling in [place], was told it was [facility].  This was 2017 summer. While he was in that hospital he had nuclear stress test and he was told his heart was fine  Recently chest pains over the Easter holiday, radiating to arms, was radiating to neck, substernal, migrating.  Was a numbness, tingling.  Lasted 5-10 min.  No SOB.  Not while excercising.  Workup suggested against any underlying cardiac disease. He is still scheduled to see his cardiologist later this month.  No real chest pains between these two episodes.  He does get pyrosis for about a year.  Will occur about 3 times per week.  No particular time of day.  Worse after eatring fast.  He lost 15 pounds intentionally, then put 5 back on.  Pretty busy at work, traveling a lot.  Swallowing: + dysphagia to solids and liquid for 2 years, rarely.  Mom had HH.  Takes alleve 4 times per week, one pill only.   Old Data Reviewed:  CT angio (chest, abd) 01/2017: 1. No aortic dissection. Simple periaortic fluid about the distal ascending aorta is felt to be pericardial fluid in the superior pericardial recess. 2. Small to moderate hiatal hernia. 3. Bronchial thickening. 4. Mild mesenteric stranding and small lymph nodes in the left central mesentery, nonspecific, likely reactive.  Labs 01/2017:  CBC, bmet normal    Review of systems: Pertinent positive and negative review of systems were noted in the above HPI section. Complete review of systems was performed and was otherwise normal.   Past Medical History:  Diagnosis Date  . Asthma   . BPH (benign prostatic hyperplasia)   . Fibromyalgia   . History of hiatal hernia   . History of kidney stones   . Hyperlipidemia   . Hypertension   . Kidney stones   .  Peripheral neuropathy   . Sleep apnea    CPAP 100%    Past Surgical History:  Procedure Laterality Date  . arm surgery Right 2005  . KNEE SURGERY Bilateral 1976  . LIPOMA EXCISION N/A 11/06/2016   Procedure: EXCISION LIPOMA ABDOMINAL WALL;  Surgeon: Clovis Riley, MD;  Location: [facility];  Service: General;  Laterality: N/A;  . NASAL SINUS SURGERY  2001  . 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 aspirin EC 81 MG tablet Take 81 mg by mouth daily.    . bisoprolol-hydrochlorothiazide (ZIAC) 5-6.25 MG per tablet Take 1 tablet by mouth daily.    . cetirizine (ZYRTEC) 10 MG tablet Take 10 mg by mouth daily as needed for allergies.     Marland Kitchen doxazosin (CARDURA XL) 4 MG 24 hr tablet Take 4 mg by mouth daily with breakfast.    . Multiple Vitamin (MULTIVITAMIN) tablet Take 1 tablet by mouth daily.    . Naproxen Sodium (ALEVE PO) Take 1-2 tablets by mouth 2 (two) times daily as needed. As needed     . pravastatin (PRAVACHOL) 80 MG tablet Take 80 mg by mouth daily.    . ranitidine (ZANTAC) 150 MG tablet Take 1 tablet (150 mg total) by mouth 2 (two) times daily. 60 tablet 0  . albuterol (PROVENTIL  HFA;VENTOLIN HFA) 108 (90 Base) MCG/ACT inhaler Inhale 2 puffs into the lungs every 6 (six) hours as needed for wheezing or shortness of breath (sob). (Patient not taking: Reported on 03/17/2017) 1 Inhaler 1   No current facility-administered medications for this visit.     Allergies as of 03/17/2017  . (No Known Allergies)    Family History  Problem Relation Age of Onset  . Heart disease Father   . Diabetes Father   . Skin cancer Father   . Heart disease Paternal Grandmother   . Valvular heart disease Brother   . Chiari malformation Daughter   . Other Daughter     Noonan syndrome  . Heart disease Paternal Grandfather   . Heart disease Mother     Social History   Social History  . Marital status: Married     Spouse name: Angelita Ingles  . Number of children: 2  . Years of education: 16   Occupational History  . production management     [employer]   Social History Main Topics  . Smoking status: Never Smoker  . Smokeless tobacco: Never Used  . Alcohol use 0.0 oz/week     Comment: 2 per month  . Drug use: No  . Sexual activity: Not on file   Other Topics Concern  . Not on file   Social History Narrative   Lives with wife   Caffeine use- coffee 2 cups daily     Physical Exam: BP 96/60 (BP Location: Left Arm, Patient Position: Sitting, Cuff Size: Normal)   Pulse 64   Ht 5\' 10"  (1.778 m) Comment: height measured without shoes  Wt 255 lb 8 oz (115.9 kg)   BMI 36.66 kg/m  Constitutional: generally well-appearing Psychiatric: alert and oriented x3 Eyes: extraocular movements intact Mouth: oral pharynx moist, no lesions Neck: supple no lymphadenopathy Cardiovascular: heart regular rate and rhythm Lungs: clear to auscultation bilaterally Abdomen: soft, nontender, nondistended, no obvious ascites, no peritoneal signs, normal bowel sounds Extremities: no lower extremity edema bilaterally Skin: no lesions on visible extremities   Assessment and plan: 56 y.o. male with  pyrosis, hiatal hernia, intermittent dysphasia, intermittent atypical chest pain  First I think it is likely that his atypical chest pains are GERD related. We discussed the anatomy of hiatal hernia and how it can predispose to pathologic acid reflux. Acid can cause a variety symptoms including chest pains, can explain his pyrosis and possibly even his intermittent dysphasia. He does have a moderate hiatal hernia on imaging. I recommended we proceed with EGD at his soonest convenience and he is going to begin using better daily antiacid control with proton pump inhibitor rather than H2 blocker. I see no reason for any further blood tests or imaging studies at this point.    Please see the "Patient Instructions" section  for addition details about the plan.   Owens Loffler, MD [facility] 03/17/2017, 8:41 AM  Cc: Aura Dials, MD

## 2017-03-19 ENCOUNTER — Ambulatory Visit (AMBULATORY_SURGERY_CENTER): Payer: 59 | Admitting: Gastroenterology

## 2017-03-19 ENCOUNTER — Encounter: Payer: Self-pay | Admitting: Gastroenterology

## 2017-03-19 VITALS — BP 104/48 | HR 56 | Temp 97.8°F | Resp 13 | Ht 70.0 in | Wt 255.0 lb

## 2017-03-19 DIAGNOSIS — R12 Heartburn: Secondary | ICD-10-CM

## 2017-03-19 DIAGNOSIS — K209 Esophagitis, unspecified without bleeding: Secondary | ICD-10-CM

## 2017-03-19 DIAGNOSIS — K208 Other esophagitis: Secondary | ICD-10-CM | POA: Diagnosis not present

## 2017-03-19 DIAGNOSIS — K449 Diaphragmatic hernia without obstruction or gangrene: Secondary | ICD-10-CM | POA: Diagnosis not present

## 2017-03-19 DIAGNOSIS — R1310 Dysphagia, unspecified: Secondary | ICD-10-CM

## 2017-03-19 MED ORDER — SODIUM CHLORIDE 0.9 % IV SOLN: 500.0000 mL | INTRAVENOUS | Status: DC

## 2017-03-19 MED ORDER — OMEPRAZOLE 40 MG PO CPDR: 40.0000 mg | DELAYED_RELEASE_CAPSULE | Freq: Two times a day (BID) | ORAL | 6 refills | Status: DC

## 2017-03-19 NOTE — Progress Notes (Signed)
Pt's states no medical or surgical changes since previsit or office visit. 

## 2017-03-19 NOTE — Patient Instructions (Signed)
YOU HAD AN ENDOSCOPIC PROCEDURE TODAY: Refer to the procedure report and other information in the discharge instructions given to you for any specific questions about what was found during the examination. If this information does not answer your questions, please call [facility] office at [phone] to clarify.   YOU SHOULD EXPECT: Some feelings of bloating in the abdomen. Passage of more gas than usual. Walking can help get rid of the air that was put into your GI tract during the procedure and reduce the bloating. If you had a lower endoscopy (such as a colonoscopy or flexible sigmoidoscopy) you may notice spotting of blood in your stool or on the toilet paper. Some abdominal soreness may be present for a day or two, also.  DIET: Your first meal following the procedure should be a light meal and then it is ok to progress to your normal diet. A half-sandwich or bowl of soup is an example of a good first meal. Heavy or fried foods are harder to digest and may make you feel nauseous or bloated. Drink plenty of fluids but you should avoid alcoholic beverages for 24 hours. If you had a esophageal dilation, please see attached instructions for diet.    ACTIVITY: Your care partner should take you home directly after the procedure. You should plan to take it easy, moving slowly for the rest of the day. You can resume normal activity the day after the procedure however YOU SHOULD NOT DRIVE, use power tools, machinery or perform tasks that involve climbing or major physical exertion for 24 hours (because of the sedation medicines used during the test).   SYMPTOMS TO REPORT IMMEDIATELY: A gastroenterologist can be reached at any hour. Please call [phone]  for any of the following symptoms:   Following upper endoscopy (EGD, EUS, ERCP, esophageal dilation) Vomiting of blood or coffee ground material  New, significant abdominal pain  New, significant chest pain or pain under the shoulder blades  Painful or  persistently difficult swallowing  New shortness of breath  Black, tarry-looking or red, bloody stools  FOLLOW UP:  If any biopsies were taken you will be contacted by phone or by letter within the next 1-3 weeks. Call [phone]  if you have not heard about the biopsies in 3 weeks.  Please also call with any specific questions about appointments or follow up tests.  

## 2017-03-19 NOTE — Progress Notes (Signed)
To recovery, report to Oliver, RN, VSS 

## 2017-03-19 NOTE — Progress Notes (Signed)
Called to room to assist during endoscopic procedure.  Patient ID and intended procedure confirmed with present staff. Received instructions for my participation in the procedure from the performing physician.  

## 2017-03-19 NOTE — Op Note (Signed)
[place] Patient Name: Timothy [name] Procedure Date: 03/19/2017 8:28 AM MRN: [id number] Endoscopist: Milus Banister , MD Age: 56 Referring MD:  Date of Birth: [birth year] Gender: Male Account #: [id number] Procedure:                Upper GI endoscopy Indications:              Dysphagia, Heartburn, Chest pain (non cardiac) Medicines:                Monitored Anesthesia Care Procedure:                Pre-Anesthesia Assessment:                           - Prior to the procedure, a History and Physical                            was performed, and patient medications and                            allergies were reviewed. The patient's tolerance of                            previous anesthesia was also reviewed. The risks                            and benefits of the procedure and the sedation                            options and risks were discussed with the patient.                            All questions were answered, and informed consent                            was obtained. Prior Anticoagulants: The patient has                            taken no previous anticoagulant or antiplatelet                            agents. ASA Grade Assessment: II - A patient with                            mild systemic disease. After reviewing the risks                            and benefits, the patient was deemed in                            satisfactory condition to undergo the procedure.                           After obtaining informed consent, the endoscope was  passed under direct vision. Throughout the                            procedure, the patient's blood pressure, pulse, and                            oxygen saturations were monitored continuously. The                            Endoscope was introduced through the mouth, and                            advanced to the second part of duodenum. The upper                            GI endoscopy  was accomplished without difficulty.                            The patient tolerated the procedure well. Scope In: Scope Out: Findings:                 Ulcertative, reflux appearing esophagitis at the GE                            junction causing focal peptic stricture. I was able                            to pass through the focal stricture without                            difficulty (lumen 14-53mm) and elected NOT to                            dilated it given the degree of active inflammation.                            Biopsies were taken with a cold forceps for                            histology.                           A medium-sized hiatal hernia was present.                           The exam was otherwise without abnormality. Complications:            No immediate complications. Estimated blood loss:                            None. Estimated Blood Loss:     Estimated blood loss: none. Impression:               - Focal, ulcerative (reflux appearing) esophagitis  at the GE junction causing focal peptic stricture.                            There was significant active inflammation,                            friability and I elected NOT to dilated the                            stricture. Biopsies were taken to check for                            underlying neoplasm which I think is unlikely.                           - Medium-sized hiatal hernia.                           - The examination was otherwise normal. Recommendation:           - Patient has a contact number available for                            emergencies. The signs and symptoms of potential                            delayed complications were discussed with the                            patient. Return to normal activities tomorrow.                            Written discharge instructions were provided to the                            patient.                           -  Resume previous diet.                           - Continue present medications. New prescription                            called in today, please take one pill 20-30 min                            before breakfast and dinner meals (omeprazole 40mg ,                            disp 60, take BID, refills 6).                           - ROV with Dr. Ardis Hughs in 2 months to assess  response to the twice daily PPI and decide if                            repeat EGD is necessary.                           - Await pathology results. Milus Banister, MD 03/19/2017 8:41:17 AM This report has been signed electronically.

## 2017-03-20 ENCOUNTER — Telehealth: Payer: Self-pay

## 2017-03-20 ENCOUNTER — Telehealth: Payer: Self-pay | Admitting: *Deleted

## 2017-03-20 ENCOUNTER — Ambulatory Visit (INDEPENDENT_AMBULATORY_CARE_PROVIDER_SITE_OTHER): Payer: 59 | Admitting: Cardiology

## 2017-03-20 ENCOUNTER — Encounter: Payer: Self-pay | Admitting: Cardiology

## 2017-03-20 VITALS — BP 136/78 | HR 67 | Ht 70.0 in | Wt 257.0 lb

## 2017-03-20 DIAGNOSIS — R079 Chest pain, unspecified: Secondary | ICD-10-CM | POA: Diagnosis not present

## 2017-03-20 DIAGNOSIS — E785 Hyperlipidemia, unspecified: Secondary | ICD-10-CM | POA: Diagnosis not present

## 2017-03-20 DIAGNOSIS — K209 Esophagitis, unspecified without bleeding: Secondary | ICD-10-CM

## 2017-03-20 DIAGNOSIS — Z8249 Family history of ischemic heart disease and other diseases of the circulatory system: Secondary | ICD-10-CM | POA: Diagnosis not present

## 2017-03-20 DIAGNOSIS — I10 Essential (primary) hypertension: Secondary | ICD-10-CM

## 2017-03-20 MED ORDER — DOXAZOSIN MESYLATE ER 4 MG PO TB24: 4.0000 mg | ORAL_TABLET | Freq: Every day | ORAL | 3 refills | Status: DC

## 2017-03-20 MED ORDER — BISOPROLOL-HYDROCHLOROTHIAZIDE 5-6.25 MG PO TABS: 1.0000 | ORAL_TABLET | Freq: Every day | ORAL | 3 refills | Status: DC

## 2017-03-20 NOTE — Telephone Encounter (Signed)
  Follow up Call-  Call back number 03/19/2017  Post procedure Call Back phone  # [phone]  Permission to leave phone message Yes  Some recent data might be hidden     No answer at # given.  Left message on VM.

## 2017-03-20 NOTE — Telephone Encounter (Signed)
Left a message at [phone] for the pt to check on him after his procedure. maw

## 2017-03-20 NOTE — Progress Notes (Signed)
[facility]. [address], [address] [place], [place]  [address] Phone: [phone] Fax:  [phone]  Date:  03/20/2017   ID:  [name], DOB 1961/09/06, MRN [id number]  PCP:  Cammy Copa, MD   History of Present Illness: Timothy [name] is a 56 y.o. male with esophagitis discovered on Mar 25, 2017 with a brother who died of myocardial infarction at age 25, nuclear stress test in 2013 that was normal, chest pain associated with nausea, diaphoresis diagnosed with MSK pain. Noted dyspnea on exertion when rushing through the airport here for follow-up  Because of this, he underwent a nuclear stress test again on 07/15/14 that was low risk, no ischemia. He also had echocardiogram on 07/15/14 that showed normal ejection fraction, grade 2 diastolic dysfunction, mildly elevated pulmonary pressure of 33 mm mercury systolic.  His symptoms have improved. Occasionally he may feel some shortness of breath with exertion. We discussed weight. He recently changed jobs where he will not have overnight phone calls, but more travels.  01/05/16 - Weight up to 260. Diet. Increased fatigue. ?Calorie, increased swelling ankle. He was down to 240 pounds. Has subsequently gained back some weight. He is felt some fatigue in his worried that this is possibly related to his heart potentially. No chest pain, no significant shortness of breath.  09/18/16-he was traveling, went to the hospital at [facility] believe in [place] where he was having continuous chest discomfort after a flight with shoulder pain and chest pressure. He had a CT of his chest on 08/07/16 that was negative except for moderate esophageal hiatal hernia. Cardiac markers were negative. He then underwent a nuclear stress test on 08/08/16 that showed no ischemia, normal EF.  Reassuring cardiac workup. He still continues to have negative back pain that sometimes wraps around to his chest. Low-grade, does not seem to be exacerbated with any certain movement  or activity. Does not change with eating.  03/20/17 - ER visit 02/2017, chest pain. Had EGD. Inflammation noted. Source of CP found. Denies syncope, fever, orthopnea. We discussed diet. We discussed how he has some tingling and numbness in his fingers distally bilaterally. Once again likely neuropathic, perhaps carpal tunnel related given his typing at times. He says it's a struggle for him to figure out whether or not this is cardiac pain or stomach pain  Wt Readings from Last 3 Encounters:  03/20/17 257 lb (116.6 kg)  25-Mar-2017 255 lb (115.7 kg)  03/17/17 255 lb 8 oz (115.9 kg)     Past Medical History:  Diagnosis Date  . Asthma   . BPH (benign prostatic hyperplasia)   . Fibromyalgia   . History of hiatal hernia   . History of kidney stones   . Hyperlipidemia   . Hypertension   . Kidney stones   . Peripheral neuropathy   . Sleep apnea    CPAP 100%    Past Surgical History:  Procedure Laterality Date  . arm surgery Right 2005  . KNEE SURGERY Bilateral 1976  . LIPOMA EXCISION N/A 11/06/2016   Procedure: EXCISION LIPOMA ABDOMINAL WALL;  Surgeon: Clovis Riley, MD;  Location: [facility];  Service: General;  Laterality: N/A;  . NASAL SINUS SURGERY  2001  . 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 aspirin EC 81 MG tablet Take 81 mg by mouth daily.    . bisoprolol-hydrochlorothiazide (ZIAC) 5-6.25 MG tablet Take  1 tablet by mouth daily. 90 tablet 3  . cetirizine (ZYRTEC) 10 MG tablet Take 10 mg by mouth daily as needed for allergies.     Marland Kitchen doxazosin (CARDURA XL) 4 MG 24 hr tablet Take 1 tablet (4 mg total) by mouth daily with breakfast. 90 tablet 3  . Multiple Vitamin (MULTIVITAMIN) tablet Take 1 tablet by mouth daily.    . Naproxen Sodium (ALEVE PO) Take 1-2 tablets by mouth 2 (two) times daily as needed. As needed     . omeprazole (PRILOSEC) 40 MG capsule Take 1 capsule (40 mg total) by mouth 2 (two)  times daily before a meal. Take 20-30 minutes before breakfast and dinner meals 60 capsule 6  . pravastatin (PRAVACHOL) 80 MG tablet Take 80 mg by mouth daily.    . ranitidine (ZANTAC) 150 MG tablet Take 1 tablet (150 mg total) by mouth 2 (two) times daily. 60 tablet 0   Current Facility-Administered Medications  Medication Dose Route Frequency Provider Last Rate Last Dose  . 0.9 %  sodium chloride infusion  500 mL Intravenous Continuous Milus Banister, MD        Allergies:   No Known Allergies  Social History:  The patient  reports that he has never smoked. He has never used smokeless tobacco. He reports that he drinks alcohol. He reports that he does not use drugs.   Family History  Problem Relation Age of Onset  . Heart disease Father   . Diabetes Father   . Skin cancer Father   . Heart disease Paternal Grandmother   . Valvular heart disease Brother   . Chiari malformation Daughter   . Other Daughter     Noonan syndrome  . Heart disease Paternal Grandfather   . Heart disease Mother     ROS:  Please see the history of present illness.   Denies any fevers, chills, orthopnea, PND, syncope   Unless specified above, all other ROS negative.   PHYSICAL EXAM: VS:  BP 136/78   Pulse 67   Ht 5\' 10"  (1.778 m)   Wt 257 lb (116.6 kg)   SpO2 97%   BMI 36.88 kg/m  GEN: Well nourished, well developed, in no acute distress  HEENT: normal  Neck: no JVD, carotid bruits, or masses Cardiac: RRR; no murmurs, rubs, or gallops,no edema  Respiratory:  clear to auscultation bilaterally, normal work of breathing GI: soft, nontender, nondistended, + BS MS: no deformity or atrophy  Skin: warm and dry, no rash Neuro:  Alert and Oriented x 3, Strength and sensation are intact Psych: euthymic mood, full affect   EKG:  EKG ordered today-09/18/16-sinus rhythm 83 with no other abnormalities. 01/05/16-sinus rhythm, nonspecific T-wave changes. Personally viewed-heart rate 65.    Other testing, lab  work reviewed.  Nuclear stress test 08/08/16 [facility]-no ischemia, normal ejection fraction, low risk.   EGD: 03/19/17: Dr. Ardis Hughs  - Ulcertative, reflux appearing esophagitis at the GE junction causing focal peptic stricture. I was able to pass through the focal stricture without difficulty (lumen 14-1mm) and elected NOT to dilated it given the degree of active inflammation. Biopsies were taken with a cold forceps for histology. Findings: - A medium-sized hiatal hernia was present. - The exam was otherwise without abnormality.  ASSESSMENT AND PLAN:  1. Chest pain - EGD conclusive for inflammation/ esophagitis. Cause of chest pain found. Previously described as continuous, low-grade, pain that is mostly back pain between shoulder blades lasting all day long, sometimes  radiates the front of his chest but has not left him and has been quite continuous since his hospitalization in [place] on 08/08/16. He has seen orthopedics in the past and he remembers stating that he had a disc problem in his lumbar spine are otherwise unremarkable. He also saw neurology and he remembers being told that his left leg neuropathic pain was likely a manifestation of fibromyalgia. Thankfully, he had a complete and full workup in [place] including troponins that were negative, EKG negative, CT of chest showed no evidence of dissection or pulmonary embolism and nuclear stress test that was low risk with no ischemia.  No further cardiac workup at this time. Reassuring. 2. Family history of CAD-Brother 65 MI, unfortunately his mother died shortly after open heart surgery. 3. Obesity-decrease carbohydrates, increase greens, plant products. He is currently reading a book on diet. We discussed at length today. 4. Hyperlipidemia-pravastatin. Excellent. No changes made. No myalgias. 5. Hypertension-very well controlled. Excellent. Medications reviewed. We will see him back on as-needed basis  Signed, Candee Furbish, MD [facility]  03/20/2017 8:45 AM

## 2017-03-20 NOTE — Patient Instructions (Signed)
Medication Instructions:  The current medical regimen is effective;  continue present plan and medications.  Follow-Up: Follow up as needed with Dr Skains.  Thank you for choosing [facility]!!     

## 2017-03-25 ENCOUNTER — Encounter: Payer: Self-pay | Admitting: Gastroenterology

## 2017-05-12 ENCOUNTER — Encounter: Payer: Self-pay | Admitting: Gastroenterology

## 2017-05-12 ENCOUNTER — Encounter (INDEPENDENT_AMBULATORY_CARE_PROVIDER_SITE_OTHER): Payer: Self-pay

## 2017-05-12 ENCOUNTER — Ambulatory Visit (INDEPENDENT_AMBULATORY_CARE_PROVIDER_SITE_OTHER): Payer: 59 | Admitting: Gastroenterology

## 2017-05-12 VITALS — BP 124/80 | HR 72 | Ht 70.0 in | Wt 258.0 lb

## 2017-05-12 DIAGNOSIS — K2100 Gastro-esophageal reflux disease with esophagitis, without bleeding: Secondary | ICD-10-CM

## 2017-05-12 DIAGNOSIS — R1310 Dysphagia, unspecified: Secondary | ICD-10-CM

## 2017-05-12 MED ORDER — OMEPRAZOLE 40 MG PO CPDR: 40.0000 mg | DELAYED_RELEASE_CAPSULE | Freq: Two times a day (BID) | ORAL | 2 refills | Status: DC

## 2017-05-12 NOTE — Patient Instructions (Addendum)
Refills (to care mark 90 day) omeprazole one pill twice daily (disp 180 with 2 refill). Best taken 20-57min before breakfast and dinner.  You will be set up for an upper endoscopy in 2 months from now to prove/disprove whether you truly have Barrett's change. We will call you to set up this appointment in 2 months.

## 2017-05-12 NOTE — Progress Notes (Signed)
Review of pertinent gastrointestinal problems: 1. Ulcerative reflux related esophagitis, focal peptic stricture noted on EGD 02/2017 Dr. 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  He was put on BID PPI and symptoms completely resolved.   HPI: This is a very pleasant 56 year old man whom I last saw about 2 months ago at the time of an upper endoscopy. See those results summarized above  Chief complaint is GERD, dysphasia  He is GERD symptoms and dysphagia symptoms have completely resolved since he has been taking proton pump inhibitor twice daily for the past 2 months. No chest pains either. He feels great.  ROS: complete GI ROS as described in HPI, all other review negative.  Constitutional:  No unintentional weight loss   Past Medical History:  Diagnosis Date  . Asthma   . BPH (benign prostatic hyperplasia)   . Fibromyalgia   . History of hiatal hernia   . History of kidney stones   . Hyperlipidemia   . Hypertension   . Kidney stones   . Peripheral neuropathy   . Sleep apnea    CPAP 100%    Past Surgical History:  Procedure Laterality Date  . arm surgery Right 2005  . KNEE SURGERY Bilateral 1976  . LIPOMA EXCISION N/A 11/06/2016   Procedure: EXCISION LIPOMA ABDOMINAL WALL;  Surgeon: Clovis Riley, MD;  Location: [facility];  Service: General;  Laterality: N/A;  . NASAL SINUS SURGERY  2001  . 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 aspirin EC 81 MG tablet Take 81 mg by mouth daily.    . bisoprolol-hydrochlorothiazide (ZIAC) 5-6.25 MG tablet Take 1 tablet by mouth daily. 90 tablet 3  . cetirizine (ZYRTEC) 10 MG tablet Take 10 mg by mouth daily as needed for allergies.     Marland Kitchen doxazosin (CARDURA XL) 4 MG 24 hr tablet Take 1 tablet (4 mg total) by mouth daily with breakfast. 90 tablet 3  . Multiple Vitamin (MULTIVITAMIN)  tablet Take 1 tablet by mouth daily.    . Naproxen Sodium (ALEVE PO) Take 1-2 tablets by mouth 2 (two) times daily as needed. As needed     . omeprazole (PRILOSEC) 40 MG capsule Take 1 capsule (40 mg total) by mouth 2 (two) times daily before a meal. Take 20-30 minutes before breakfast and dinner meals 60 capsule 6  . pravastatin (PRAVACHOL) 80 MG tablet Take 80 mg by mouth daily.    . ranitidine (ZANTAC) 150 MG tablet Take 1 tablet (150 mg total) by mouth 2 (two) times daily. 60 tablet 0   Current Facility-Administered Medications  Medication Dose Route Frequency Provider Last Rate Last Dose  . 0.9 %  sodium chloride infusion  500 mL Intravenous Continuous Milus Banister, MD        Allergies as of 05/12/2017  . (No Known Allergies)    Family History  Problem Relation Age of Onset  . Heart disease Father   . Diabetes Father   . Skin cancer Father   . Heart disease Paternal Grandmother   . Valvular heart disease Brother   . Chiari malformation Daughter   . Other Daughter        Noonan syndrome  . Heart disease Paternal Grandfather   . Heart disease Mother     Social History   Social History  . Marital status: Married    Spouse name:  Roberta  . Number of children: 2  . Years of education: 16   Occupational History  . production management     [employer]   Social History Main Topics  . Smoking status: Never Smoker  . Smokeless tobacco: Never Used  . Alcohol use 0.0 oz/week     Comment: 2 per month  . Drug use: No  . Sexual activity: Not on file   Other Topics Concern  . Not on file   Social History Narrative   Lives with wife   Caffeine use- coffee 2 cups daily     Physical Exam: BP 124/80   Pulse 72   Ht 5\' 10"  (1.778 m)   Wt 258 lb (117 kg)   BMI 37.02 kg/m  Constitutional: generally well-appearing Psychiatric: alert and oriented x3 Abdomen: soft, nontender, nondistended, no obvious ascites, no peritoneal signs, normal bowel sounds No  peripheral edema noted in lower extremities  Assessment and plan: 56 y.o. male with Reflux esophagitis, ulcerative, nondysplastic Barrett's  We discussed the fact that he may have underlying Barrett's change but that it is sometimes truly hard to distinguish this in the setting of acute inflammation such as he had. I recommended we repeat the upper endoscopy in about 2 months to allow even further time for the acid related inflammation to resolve. He will stay on proton pump inhibitor twice daily until that time. I see no reason for any further blood tests or imaging studies prior to then.  Please see the "Patient Instructions" section for addition details about the plan.  Owens Loffler, MD [facility] 05/12/2017, 3:30 PM

## 2017-05-13 ENCOUNTER — Other Ambulatory Visit: Payer: Self-pay

## 2017-05-13 DIAGNOSIS — R1310 Dysphagia, unspecified: Secondary | ICD-10-CM

## 2017-05-13 MED ORDER — OMEPRAZOLE 40 MG PO CPDR: 40.0000 mg | DELAYED_RELEASE_CAPSULE | Freq: Two times a day (BID) | ORAL | 2 refills | Status: DC

## 2017-07-14 ENCOUNTER — Telehealth: Payer: Self-pay

## 2017-07-14 NOTE — Telephone Encounter (Signed)
The pt has been scheduled for EGD and previsit.

## 2017-07-14 NOTE — Telephone Encounter (Signed)
-----   Message from Jeoffrey Massed, RN sent at 05/12/2017  3:42 PM EDT ----- upper endoscopy in 2 months

## 2017-08-25 ENCOUNTER — Encounter: Payer: Self-pay | Admitting: Gastroenterology

## 2017-08-25 ENCOUNTER — Ambulatory Visit (AMBULATORY_SURGERY_CENTER): Payer: Self-pay | Admitting: *Deleted

## 2017-08-25 VITALS — Ht 70.0 in | Wt 259.2 lb

## 2017-08-25 DIAGNOSIS — K2210 Ulcer of esophagus without bleeding: Secondary | ICD-10-CM

## 2017-08-25 NOTE — Progress Notes (Signed)
No allergies to eggs or soy. No problems with anesthesia.  Pt given Emmi instructions for EGD  No oxygen use  No diet drug use  

## 2017-08-29 ENCOUNTER — Telehealth: Payer: Self-pay | Admitting: Gastroenterology

## 2017-08-29 NOTE — Telephone Encounter (Signed)
Patient canceled his procedure for Monday 09/01/17 due to his [place] being transfered for emergency lung surgery to [place] and will have to go out of town. Patient resch to 11.7.18. FYI

## 2017-08-29 NOTE — Telephone Encounter (Signed)
Ok, thanks.

## 2017-09-01 ENCOUNTER — Encounter: Payer: 59 | Admitting: Gastroenterology

## 2017-09-09 ENCOUNTER — Telehealth: Payer: Self-pay | Admitting: Diagnostic Neuroimaging

## 2017-09-09 NOTE — Telephone Encounter (Signed)
noted 

## 2017-09-09 NOTE — Telephone Encounter (Signed)
Noted  

## 2017-09-09 NOTE — Telephone Encounter (Signed)
Pt called he is having daily symptoms of neuropathy in the left foot and leg that have been progressivley getting worse. He has an appt with Hoyle Sauer 09/18/17. He is Dr Mamie Nick pt  [name]

## 2017-09-17 NOTE — Progress Notes (Signed)
[facility]  PATIENT: Timothy [name] DOB: Jan 20, 1961   REASON FOR VISIT: Follow-up for small fiber neuropathy HISTORY FROM:  patient    HISTORY OF PRESENT ILLNESS:UPDATE 10/25/2018CM Timothy [name], 56 year old male returns for follow-up for small fiber neuropathy.he has begun having daily symptoms of neuropathy in the left foot and lack that has been progressively getting worse over the last year.He has never been on any medication for this.He feels he cannot be as active as previously due to burning pain in the feet. When his activity decreased his weight increased. He was recently diagnosed with hiatal hernia and Barrett's esophagitis.He has a history of obstructive sleep apnea and uses CPAP.He returns for reevaluation   04/12/16 VP61 year old right-handed male with hypertension, hypercholesteremia, sleep apnea, kidney stones, here for evaluation of back pain and numbness in the feet. Symptoms present for at least 6 months.  For past 6 months has had midline low back pain. Symptoms worse when he sleeps for a long time. Worsened early morning. Patient improved after activity. No radiating symptoms. No specific triggering factors. No history of severe accident or trauma.  Around the same time patient has also noted numbness and tingling in the balls of the feet, left worse than right side. Symptoms seem to be independent from back pain problems. Patient has had lab screening testing with B12, TSH, A1c which were unremarkable.    REVIEW OF SYSTEMS: Full 14 system review of systems performed and notable only for those listed, all others are neg:  Constitutional: fatigue Cardiovascular: leg swelling Ear/Nose/Throat: neg  Skin: neg Eyes: neg Respiratory: neg Gastroitestinal: neg  Hematology/Lymphatic: neg  Endocrine: neg Musculoskeletal:neg Allergy/Immunology: environmental allergies Neurological: burning pain in feet, numbness Psychiatric: neg Sleep : obstructive  sleep apnea with CPAP   ALLERGIES: No Known Allergies  HOME MEDICATIONS: Outpatient Medications Prior to Visit  Medication Sig Dispense Refill  . aspirin EC 81 MG tablet Take 81 mg by mouth daily.    . bisoprolol-hydrochlorothiazide (ZIAC) 5-6.25 MG tablet Take 1 tablet by mouth daily. 90 tablet 3  . cetirizine (ZYRTEC) 10 MG tablet Take 10 mg by mouth daily as needed for allergies.     Marland Kitchen doxazosin (CARDURA XL) 4 MG 24 hr tablet Take 1 tablet (4 mg total) by mouth daily with breakfast. 90 tablet 3  . Multiple Vitamin (MULTIVITAMIN) tablet Take 1 tablet by mouth daily.    . Naproxen Sodium (ALEVE PO) Take 1-2 tablets by mouth 2 (two) times daily as needed. As needed     . pravastatin (PRAVACHOL) 80 MG tablet Take 80 mg by mouth daily.    Marland Kitchen omeprazole (PRILOSEC) 40 MG capsule Take 1 capsule (40 mg total) by mouth 2 (two) times daily before a meal. Take 20-30 minutes before breakfast and dinner meals 180 capsule 2   No facility-administered medications prior to visit.     PAST MEDICAL HISTORY: Past Medical History:  Diagnosis Date  . Allergy   . Asthma   . Barrett's esophagus   . BPH (benign prostatic hyperplasia)   . Fibromyalgia   . GERD (gastroesophageal reflux disease)   . History of hiatal hernia   . History of kidney stones   . Hyperlipidemia   . Hypertension   . Kidney stones   . Peripheral neuropathy   . Sleep apnea    CPAP 100%    PAST SURGICAL HISTORY: Past Surgical History:  Procedure Laterality Date  . arm surgery Right 2005  . KNEE SURGERY Bilateral 1976  .  LIPOMA EXCISION N/A 11/06/2016   Procedure: EXCISION LIPOMA ABDOMINAL WALL;  Surgeon: Clovis Riley, MD;  Location: [facility];  Service: General;  Laterality: N/A;  . NASAL SINUS SURGERY  2001  . 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 Heart disease Father   . Diabetes Father   . Skin cancer Father   . Heart disease  Paternal Grandmother   . Valvular heart disease Brother   . Chiari malformation Daughter   . Other Daughter        Noonan syndrome  . Heart disease Paternal Grandfather   . Heart disease Mother   . Colon cancer Neg Hx     SOCIAL HISTORY: Social History   Social History  . Marital status: Married    Spouse name: Angelita Ingles  . Number of children: 2  . Years of education: 16   Occupational History  . production management     [place]   Social History Main Topics  . Smoking status: Never Smoker  . Smokeless tobacco: Never Used  . Alcohol use 0.0 oz/week     Comment: 2 per month  . Drug use: No  . Sexual activity: Not on file   Other Topics Concern  . Not on file   Social History Narrative   Lives with wife   Caffeine use- coffee 2 cups daily     PHYSICAL EXAM  Vitals:   09/18/17 1020  BP: 117/64  Pulse: (!) 59  Weight: 263 lb 9.6 oz (119.6 kg)   Body mass index is 37.82 kg/m.  Generalized: Well developed, obese male in no acute distress  Head: normocephalic and atraumatic,. Oropharynx benign  Neck: Supple,  Musculoskeletal: No deformity   Neurological examination   Mentation: Alert oriented to time, place, history taking. Attention span and concentration appropriate. Recent and remote memory intact.  Follows all commands speech and language fluent.   Cranial nerve II-XII: Pupils were equal round reactive to light extraocular movements were full, visual field were full on confrontational test. Facial sensation and strength were normal. hearing was intact to finger rubbing bilaterally. Uvula tongue midline. head turning and shoulder shrug were normal and symmetric.Tongue protrusion into cheek strength was normal. Motor: normal bulk and tone, full strength in the BUE, BLE,  Sensory: normal and symmetric to light touch, Vibration, and  Temperature decreased pinprick in both feet  Coordination: finger-nose-finger, heel-to-shin bilaterally, no  dysmetria Reflexes: Brachioradialis 2/2, biceps 2/2, triceps 2/2, patellar 2/2, Achilles 2/2, plantar responses were flexor bilaterally. Gait and Station: Rising up from seated position without assistance, normal stance,  moderate stride, good arm swing, smooth turning, able to perform tiptoe, and heel walking without difficulty. Tandem gait is unsteady. Romberg negative  DIAGNOSTIC DATA (LABS, IMAGING, TESTING) - I reviewed patient records, labs, notes, testing and imaging myself where available.  Lab Results  Component Value Date   WBC 7.4 02/21/2017   HGB 13.2 02/21/2017   HCT 40.6 02/21/2017   MCV 80.7 02/21/2017   PLT 263 02/21/2017      Component Value Date/Time   NA 139 02/21/2017 2335   K 3.8 02/21/2017 2335   CL 105 02/21/2017 2335   CO2 26 02/21/2017 2335   GLUCOSE 134 (H) 02/21/2017 2335   BUN 15 02/21/2017 2335   CREATININE 1.11 02/21/2017 2335   CALCIUM 9.0 02/21/2017 2335   GFRNONAA >60 02/21/2017 2335   GFRAA >  60 02/21/2017 2335    ASSESSMENT AND PLAN 56 y.o. year old male here with  lower extremity numbness and tingling for past 1.5 years.  The lower extremity/feet numbness and tingling may be related to small fiber neuropathy. EMG nerve conduction returned normal without evidence of large fiber neuropathy.The patient is a current patient of Dr. Leta Baptist  who is out of the office today . This note is sent to the work in doctor.        PLAN:Begin gabapentin 300 mg at night,  medication may make you drowsy,  dizzy or have blurred vision,did not do anything that needs minimal alertness until you  know how this medication affects you , alcohol can increase drowsiness and dizziness  Take Complex vitamin B daily Continue to exercise Given information on peripheral neuropathy Follow-up in 6 months I spent 25 minutes in total face to face time with the patient more than 50% of which was spent counseling and coordination of care, reviewing test results reviewing  medications and discussing and reviewing the diagnosis of peripheral neuropathy and further treatment options. , Rayburn Ma, [facility], APRN  [facility] [address] [place], [place] [address] [phone]

## 2017-09-18 ENCOUNTER — Ambulatory Visit (INDEPENDENT_AMBULATORY_CARE_PROVIDER_SITE_OTHER): Payer: 59 | Admitting: Nurse Practitioner

## 2017-09-18 ENCOUNTER — Encounter: Payer: Self-pay | Admitting: Nurse Practitioner

## 2017-09-18 DIAGNOSIS — G6289 Other specified polyneuropathies: Secondary | ICD-10-CM | POA: Diagnosis not present

## 2017-09-18 DIAGNOSIS — G629 Polyneuropathy, unspecified: Secondary | ICD-10-CM | POA: Insufficient documentation

## 2017-09-18 MED ORDER — GABAPENTIN 300 MG PO CAPS: 300.0000 mg | ORAL_CAPSULE | Freq: Every day | ORAL | 1 refills | Status: DC

## 2017-09-18 NOTE — Patient Instructions (Addendum)
Begin gabapentin 300 mg at night medication may make you drowsy dizzy or have blurred vision,did not do anything that needs minimal alertness to know how this medication affects you , alcohol can increase drowsiness and dizziness  Complex vitamin B daily Continue to exercise Given information on peripheral neuropathy Follow-up in 6 months     Peripheral Neuropathy Peripheral neuropathy is a type of nerve damage. It affects nerves that carry signals between the spinal cord and other parts of the body. These are called peripheral nerves. With peripheral neuropathy, one nerve or a group of nerves may be damaged. What are the causes? Many things can damage peripheral nerves. For some people with peripheral neuropathy, the cause is unknown. Some causes include:  Diabetes. This is the most common cause of peripheral neuropathy.  Injury to a nerve.  Pressure or stress on a nerve that lasts a long time.  Too little vitamin B. Alcoholism can lead to this.  Infections.  Autoimmune diseases, such as multiple sclerosis and systemic lupus erythematosus.  Inherited nerve diseases.  Some medicines, such as cancer drugs.  Toxic substances, such as lead and mercury.  Too little blood flowing to the legs.  Kidney disease.  Thyroid disease.  What are the signs or symptoms? Different people have different symptoms. The symptoms you have will depend on which of your nerves is damaged. Common symptoms include:  Loss of feeling (numbness) in the feet and hands.  Tingling in the feet and hands.  Pain that burns.  Very sensitive skin.  Weakness.  Not being able to move a part of the body (paralysis).  Muscle twitching.  Clumsiness or poor coordination.  Loss of balance.  Not being able to control your bladder.  Feeling dizzy.  Sexual problems.  How is this diagnosed? Peripheral neuropathy is a symptom, not a disease. Finding the cause of peripheral neuropathy can be hard. To  figure that out, your health care provider will take a medical history and do a physical exam. A neurological exam will also be done. This involves checking things affected by your brain, spinal cord, and nerves (nervous system). For example, your health care provider will check your reflexes, how you move, and what you can feel. Other types of tests may also be ordered, such as:  Blood tests.  A test of the fluid in your spinal cord.  Imaging tests, such as CT scans or an MRI.  Electromyography (EMG). This test checks the nerves that control muscles.  Nerve conduction velocity tests. These tests check how fast messages pass through your nerves.  Nerve biopsy. A small piece of nerve is removed. It is then checked under a microscope.  How is this treated?  Medicine is often used to treat peripheral neuropathy. Medicines may include: ? Pain-relieving medicines. Prescription or over-the-counter medicine may be suggested. ? Antiseizure medicine. This may be used for pain. ? Antidepressants. These also may help ease pain from neuropathy. ? Lidocaine. This is a numbing medicine. You might wear a patch or be given a shot. ? Mexiletine. This medicine is typically used to help control irregular heart rhythms.  Surgery. Surgery may be needed to relieve pressure on a nerve or to destroy a nerve that is causing pain.  Physical therapy to help movement.  Assistive devices to help movement. Follow these instructions at home:  Only take over-the-counter or prescription medicines as directed by your health care provider. Follow the instructions carefully for any given medicines. Do not take any other medicines  without first getting approval from your health care provider.  If you have diabetes, work closely with your health care provider to keep your blood sugar under control.  If you have numbness in your feet: ? Check every day for signs of injury or infection. Watch for redness, warmth, and  swelling. ? Wear padded socks and comfortable shoes. These help protect your feet.  Do not do things that put pressure on your damaged nerve.  Do not smoke. Smoking keeps blood from getting to damaged nerves.  Avoid or limit alcohol. Too much alcohol can cause a lack of B vitamins. These vitamins are needed for healthy nerves.  Develop a good support system. Coping with peripheral neuropathy can be stressful. Talk to a mental health specialist or join a support group if you are struggling.  Follow up with your health care provider as directed. Contact a health care provider if:  You have new signs or symptoms of peripheral neuropathy.  You are struggling emotionally from dealing with peripheral neuropathy.  You have a fever. Get help right away if:  You have an injury or infection that is not healing.  You feel very dizzy or begin vomiting.  You have chest pain.  You have trouble breathing. This information is not intended to replace advice given to you by your health care provider. Make sure you discuss any questions you have with your health care provider. Document Released: 11/01/2002 Document Revised: 04/18/2016 Document Reviewed: 07/19/2013 Elsevier Interactive Patient Education  2017 [employer].

## 2017-09-24 NOTE — Progress Notes (Signed)
Personally  participated in, made any corrections needed, and agree with history, physical, neuro exam,assessment and plan as stated above.    Trenell Concannon, MD [facility] 

## 2017-10-01 ENCOUNTER — Encounter: Payer: 59 | Admitting: Gastroenterology

## 2017-10-10 ENCOUNTER — Encounter: Payer: Self-pay | Admitting: Gastroenterology

## 2017-10-10 ENCOUNTER — Other Ambulatory Visit: Payer: Self-pay

## 2017-10-10 ENCOUNTER — Ambulatory Visit (AMBULATORY_SURGERY_CENTER): Payer: 59 | Admitting: Gastroenterology

## 2017-10-10 VITALS — BP 104/57 | HR 63 | Temp 98.0°F | Resp 17 | Ht 70.0 in | Wt 259.0 lb

## 2017-10-10 DIAGNOSIS — K449 Diaphragmatic hernia without obstruction or gangrene: Secondary | ICD-10-CM

## 2017-10-10 DIAGNOSIS — K319 Disease of stomach and duodenum, unspecified: Secondary | ICD-10-CM | POA: Diagnosis not present

## 2017-10-10 DIAGNOSIS — K2970 Gastritis, unspecified, without bleeding: Secondary | ICD-10-CM | POA: Diagnosis not present

## 2017-10-10 DIAGNOSIS — K2210 Ulcer of esophagus without bleeding: Secondary | ICD-10-CM

## 2017-10-10 DIAGNOSIS — K209 Esophagitis, unspecified without bleeding: Secondary | ICD-10-CM

## 2017-10-10 MED ORDER — SODIUM CHLORIDE 0.9 % IV SOLN: 500.0000 mL | INTRAVENOUS | Status: DC

## 2017-10-10 NOTE — Progress Notes (Signed)
Called to room to assist during endoscopic procedure.  Patient ID and intended procedure confirmed with present staff. Received instructions for my participation in the procedure from the performing physician.  

## 2017-10-10 NOTE — Patient Instructions (Signed)
Impression/Recommendations:  Gastritis handout given to patient. Hiatal hernia handout given to patient.  Resume previous diet. Continue present medications.  Await pathology results.  YOU HAD AN ENDOSCOPIC PROCEDURE TODAY AT [facility]:   Refer to the procedure report that was given to you for any specific questions about what was found during the examination.  If the procedure report does not answer your questions, please call your gastroenterologist to clarify.  If you requested that your care partner not be given the details of your procedure findings, then the procedure report has been included in a sealed envelope for you to review at your convenience later.  YOU SHOULD EXPECT: Some feelings of bloating in the abdomen. Passage of more gas than usual.  Walking can help get rid of the air that was put into your GI tract during the procedure and reduce the bloating. If you had a lower endoscopy (such as a colonoscopy or flexible sigmoidoscopy) you may notice spotting of blood in your stool or on the toilet paper. If you underwent a bowel prep for your procedure, you may not have a normal bowel movement for a few days.  Please Note:  You might notice some irritation and congestion in your nose or some drainage.  This is from the oxygen used during your procedure.  There is no need for concern and it should clear up in a day or so.  SYMPTOMS TO REPORT IMMEDIATELY:  Following upper endoscopy (EGD)  Vomiting of blood or coffee ground material  New chest pain or pain under the shoulder blades  Painful or persistently difficult swallowing  New shortness of breath  Fever of 100F or higher  Black, tarry-looking stools  For urgent or emergent issues, a gastroenterologist can be reached at any hour by calling [phone].   DIET:  We do recommend a small meal at first, but then you may proceed to your regular diet.  Drink plenty of fluids but you should avoid alcoholic  beverages for 24 hours.  ACTIVITY:  You should plan to take it easy for the rest of today and you should NOT DRIVE or use heavy machinery until tomorrow (because of the sedation medicines used during the test).    FOLLOW UP: Our staff will call the number listed on your records the next business day following your procedure to check on you and address any questions or concerns that you may have regarding the information given to you following your procedure. If we do not reach you, we will leave a message.  However, if you are feeling well and you are not experiencing any problems, there is no need to return our call.  We will assume that you have returned to your regular daily activities without incident.  If any biopsies were taken you will be contacted by phone or by letter within the next 1-3 weeks.  Please call us at [phone] if you have not heard about the biopsies in 3 weeks.    SIGNATURES/CONFIDENTIALITY: You and/or your care partner have signed paperwork which will be entered into your electronic medical record.  These signatures attest to the fact that that the information above on your After Visit Summary has been reviewed and is understood.  Full responsibility of the confidentiality of this discharge information lies with you and/or your care-partner.

## 2017-10-10 NOTE — Progress Notes (Signed)
Pt's states no medical or surgical changes since previsit or office visit. 

## 2017-10-10 NOTE — Progress Notes (Signed)
To PACU, VSS. Report to [email or web address] 

## 2017-10-10 NOTE — Op Note (Signed)
[facility] Patient Name: Timothy [name] Procedure Date: 10/10/2017 9:27 AM MRN: [id number] Endoscopist: Milus Banister , MD Age: 56 Referring MD:  Date of Birth: 20-Mar-1961 Gender: Male Account #: [id number] Procedure:                Upper GI endoscopy Indications:              GERD and dysphagia that have completely improved                            after EGD several months ago and starting PPI (BID                            for many months then once daily recently). Path of                            GE junction suggested Barrett's Medicines:                Monitored Anesthesia Care Procedure:                Pre-Anesthesia Assessment:                           - Prior to the procedure, a History and Physical                            was performed, and patient medications and                            allergies were reviewed. The patient's tolerance of                            previous anesthesia was also reviewed. The risks                            and benefits of the procedure and the sedation                            options and risks were discussed with the patient.                            All questions were answered, and informed consent                            was obtained. Prior Anticoagulants: The patient has                            taken no previous anticoagulant or antiplatelet                            agents. ASA Grade Assessment: II - A patient with                            mild systemic disease. After reviewing the risks  and benefits, the patient was deemed in                            satisfactory condition to undergo the procedure.                           After obtaining informed consent, the endoscope was                            passed under direct vision. Throughout the                            procedure, the patient's blood pressure, pulse, and                            oxygen saturations were  monitored continuously. The                            Endoscope was introduced through the mouth, and                            advanced to the second part of duodenum. The upper                            GI endoscopy was accomplished without difficulty.                            The patient tolerated the procedure well. Scope In: Scope Out: Findings:                 There was a focal mild stenosis at the GE junction                            without any associated inflammation, edema. No                            evidence of Barrett's change. Given complete lack                            of dysphagia, the mild stenosis was not dilated.                           Small hiatal hernial                           Moderate inflammation characterized by erosions,                            erythema, friability and granularity (with a slight                            linear pattern) was found in the gastric body and  in the gastric antrum. Biopsies were taken with a                            cold forceps for histology.                           The exam was otherwise without abnormality. Complications:            No immediate complications. Estimated blood loss:                            None. Estimated Blood Loss:     Estimated blood loss: none. Impression:               - The inflammation at the GE junction has                            completely healed and there is no endoscopic                            evidence of Barrett's esophagus. There is a minor                            stenosis at the GE junction which was not dilated                            given complete improvement of dysphagia on PPI                           - Moderate gastritis, biopsied to check for H.                            pylori. NSAID related?                           - Small hiatal hernia.                           - The examination was otherwise normal. Recommendation:            - Patient has a contact number available for                            emergencies. The signs and symptoms of potential                            delayed complications were discussed with the                            patient. Return to normal activities tomorrow.                            Written discharge instructions were provided to the                            patient.                           -  Resume previous diet.                           - Continue present medications. Once daily                            omeprazole before breakfast daily.                           - Await pathology results. Milus Banister, MD 10/10/2017 9:41:46 AM This report has been signed electronically.

## 2017-10-13 ENCOUNTER — Telehealth: Payer: Self-pay | Admitting: *Deleted

## 2017-10-13 ENCOUNTER — Telehealth: Payer: Self-pay

## 2017-10-13 NOTE — Telephone Encounter (Signed)
  Follow up Call-  Call back number 10/10/2017 03/19/2017  Post procedure Call Back phone  # [phone]  Permission to leave phone message Yes Yes  Some recent data might be hidden     Patient questions:  Do you have a fever, pain , or abdominal swelling? No. Pain Score  0 *  Have you tolerated food without any problems? Yes.    Have you been able to return to your normal activities? Yes.    Do you have any questions about your discharge instructions: Diet   No. Medications  No. Follow up visit  No.  Do you have questions or concerns about your Care? No.  Actions: * If pain score is 4 or above: No action needed, pain <4.  No problems per pt.  He said he spoke with someone this am and reported he was fine. maw

## 2017-10-13 NOTE — Telephone Encounter (Signed)
  Follow up Call-  Call back number 10/10/2017 03/19/2017  Post procedure Call Back phone  # [phone]  Permission to leave phone message Yes Yes  Some recent data might be hidden     Patient questions:  Do you have a fever, pain , or abdominal swelling? No. Pain Score  0 *  Have you tolerated food without any problems? Yes.    Have you been able to return to your normal activities? Yes.    Do you have any questions about your discharge instructions: Diet   No. Medications  No. Follow up visit  No.  Do you have questions or concerns about your Care? No.  Actions: * If pain score is 4 or above: No action needed, pain <4.

## 2017-10-19 ENCOUNTER — Encounter: Payer: Self-pay | Admitting: Gastroenterology

## 2018-01-27 ENCOUNTER — Other Ambulatory Visit: Payer: Self-pay | Admitting: Gastroenterology

## 2018-01-27 DIAGNOSIS — R1310 Dysphagia, unspecified: Secondary | ICD-10-CM

## 2018-02-06 ENCOUNTER — Other Ambulatory Visit: Payer: Self-pay

## 2018-02-06 ENCOUNTER — Encounter (HOSPITAL_COMMUNITY): Payer: Self-pay | Admitting: Emergency Medicine

## 2018-02-06 ENCOUNTER — Ambulatory Visit (HOSPITAL_COMMUNITY)
Admission: EM | Admit: 2018-02-06 | Discharge: 2018-02-06 | Disposition: A | Discharge status: Home / Self Care | Payer: BLUE CROSS/BLUE SHIELD | Attending: Family Medicine | Admitting: Family Medicine

## 2018-02-06 DIAGNOSIS — H6121 Impacted cerumen, right ear: Secondary | ICD-10-CM

## 2018-02-06 DIAGNOSIS — H6122 Impacted cerumen, left ear: Secondary | ICD-10-CM | POA: Diagnosis not present

## 2018-02-06 DIAGNOSIS — H6123 Impacted cerumen, bilateral: Secondary | ICD-10-CM | POA: Diagnosis not present

## 2018-02-06 DIAGNOSIS — L739 Follicular disorder, unspecified: Secondary | ICD-10-CM | POA: Diagnosis not present

## 2018-02-06 DIAGNOSIS — L0102 Bockhart's impetigo: Secondary | ICD-10-CM | POA: Diagnosis not present

## 2018-02-06 NOTE — Discharge Instructions (Signed)
Ear wax removed. No infection seen. As discussed, you can use debrox or hydrogen peroxide intermittently to prevent symptoms from reoccurring. Recheck as needed.

## 2018-02-06 NOTE — ED Provider Notes (Signed)
[place]    CSN: [id number] Arrival date & time: 02/06/18  1132     History   Chief Complaint Chief Complaint  Patient presents with  . Ear Problem    bilateral    HPI Timothy [name] is a 57 y.o. male.   57 year old male comes in for bilateral ear fullness.  [facility], states the provider saw white object in his right ear, and was told to come here for evaluation instead. He had rinsed his ears today and feels like there is water in the ear. His job requires ear plug use, and has regular ear wax build up due to that. Denies URI symptoms such as cough, congestion, sore throat. Denies fever, chills, night sweats.       Past Medical History:  Diagnosis Date  . Allergy   . Asthma   . Barrett's esophagus   . BPH (benign prostatic hyperplasia)   . Fibromyalgia   . GERD (gastroesophageal reflux disease)   . History of hiatal hernia   . History of kidney stones   . Hyperlipidemia   . Hypertension   . Kidney stones   . Peripheral neuropathy   . Sleep apnea    CPAP 100%  . Small fiber neuropathy     Patient Active Problem List   Diagnosis Date Noted  . Peripheral neuropathy 09/18/2017  . Chest pain with moderate risk of acute coronary syndrome 07/01/2014  . Family history of coronary artery disease in brother 07/01/2014  . Sleep apnea 07/01/2014  . Obesity-  07/01/2014  . Dyspnea on exertion 07/01/2014  . HTN (hypertension) 07/01/2014  . Dyslipidemia 07/01/2014    Past Surgical History:  Procedure Laterality Date  . arm surgery Right 2005  . KNEE SURGERY Bilateral 1976  . LIPOMA EXCISION N/A 11/06/2016   Procedure: EXCISION LIPOMA ABDOMINAL WALL;  Surgeon: Clovis Riley, MD;  Location: [facility];  Service: General;  Laterality: N/A;  . NASAL SINUS SURGERY  2001  . tumor 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 Right    2001-2002       Home Medications    Prior to Admission medications   Medication Sig Start  Date End Date Taking? Authorizing Provider  aspirin EC 81 MG tablet Take 81 mg by mouth daily.   Yes [provider]  bisoprolol-hydrochlorothiazide (ZIAC) 5-6.25 MG tablet Take 1 tablet by mouth daily. 03/20/17  Yes Jerline Pain, MD  cetirizine (ZYRTEC) 10 MG tablet Take 10 mg by mouth daily as needed for allergies.    Yes [provider]  doxazosin (CARDURA XL) 4 MG 24 hr tablet Take 1 tablet (4 mg total) by mouth daily with breakfast. 03/20/17  Yes Jerline Pain, MD  gabapentin (NEURONTIN) 300 MG capsule Take 1 capsule (300 mg total) by mouth at bedtime. 09/18/17  Yes Dennie Bible, NP  Multiple Vitamin (MULTIVITAMIN) tablet Take 1 tablet by mouth daily.   Yes [provider]  Naproxen Sodium (ALEVE PO) Take 1-2 tablets by mouth 2 (two) times daily as needed. As needed    Yes [provider]  omeprazole (PRILOSEC) 40 MG capsule TAKE 1 CAPSULE TWICE DAILY BEFORE A MEAL. TAKE 20-30  MINUTES BEFORE BREAKFAST   AND DINNER MEALS 01/27/18  Yes Milus Banister, MD  pravastatin (PRAVACHOL) 80 MG tablet Take 80 mg by mouth daily.   Yes [provider]  omeprazole (PRILOSEC) 40 MG capsule Take 1 capsule (40 mg  total) by mouth 2 (two) times daily before a meal. Take 20-30 minutes before breakfast and dinner meals 05/13/17 08/11/17  Milus Banister, MD    Family History Family History  Problem Relation Age of Onset  . Heart disease Father   . Diabetes Father   . Skin cancer Father   . Heart disease Paternal Grandmother   . Valvular heart disease Brother   . Chiari malformation Daughter   . Other Daughter        Noonan syndrome  . Heart disease Paternal Grandfather   . Heart disease Mother   . Colon cancer Neg Hx   . Stomach cancer Neg Hx   . Rectal cancer Neg Hx   . Esophageal cancer Neg Hx     Social History Social History   Tobacco Use  . Smoking status: Never Smoker  . Smokeless tobacco: Never Used  Substance Use Topics  . Alcohol  use: Yes    Alcohol/week: 0.0 oz    Comment: 2 per month  . Drug use: No     Allergies   Patient has no known allergies.   Review of Systems Review of Systems  Reason unable to perform ROS: See HPI as above.     Physical Exam Triage Vital Signs ED Triage Vitals  Enc Vitals Group     BP 02/06/18 1239 130/70     Pulse Rate 02/06/18 1239 67     Resp --      Temp 02/06/18 1239 98.2 F (36.8 C)     Temp Source 02/06/18 1239 Oral     SpO2 02/06/18 1239 96 %     Weight --      Height --      Head Circumference --      Peak Flow --      Pain Score 02/06/18 1237 2     Pain Loc --      Pain Edu? --      Excl. in [place]? --    No data found.  Updated Vital Signs BP 130/70 (BP Location: Left Arm)   Pulse 67   Temp 98.2 F (36.8 C) (Oral)   SpO2 96%   Physical Exam  Constitutional: He is oriented to person, place, and time. He appears well-developed and well-nourished. No distress.  HENT:  Head: Normocephalic and atraumatic.  No tenderness to palpation of bilateral tragus.  Bilateral cerumen impaction, TM not visible.  Right ear canal with white membrane, question earwax versus foreign body.    White membrane removed via curettes, consistent with ear wax. Will use irrigation for cerumen impaction given depth.   TM visible after irrigation.  Without erythema, bulging.  Eyes: Conjunctivae are normal. Pupils are equal, round, and reactive to light.  Neurological: He is alert and oriented to person, place, and time.     UC Treatments / Results  Labs (all labs ordered are listed, but only abnormal results are displayed) Labs Reviewed - No data to display  EKG  EKG Interpretation None       Radiology No results found.  Procedures Procedures (including critical care time)  Medications Ordered in UC Medications - No data to display   Initial Impression / Assessment and Plan / UC Course  I have reviewed the triage vital signs and the nursing  notes.  Pertinent labs & imaging results that were available during my care of the patient were reviewed by me and considered in my medical decision making (see chart for details).  Patient with resolution of symptoms after ear irrigation.  No signs of otitis externa or otitis media.  Discussed ways to prevent cerumen impaction in the future.  Recheck as needed.  Patient expresses understanding and agrees to plan.  Final Clinical Impressions(s) / UC Diagnoses   Final diagnoses:  Bilateral impacted cerumen    ED Discharge Orders    None        Ok Edwards, PA-C 02/06/18 1425

## 2018-02-06 NOTE — ED Triage Notes (Signed)
Pt reports he went to a [facility] today for ear fullness to get his ears cleaned and the provider there told him she saw a white foreign object in his right ear.  Pt had his ears rinsed today and since then he states he feels like he has water in his ears.

## 2018-02-27 DIAGNOSIS — R208 Other disturbances of skin sensation: Secondary | ICD-10-CM | POA: Diagnosis not present

## 2018-02-27 DIAGNOSIS — D1801 Hemangioma of skin and subcutaneous tissue: Secondary | ICD-10-CM | POA: Diagnosis not present

## 2018-02-27 DIAGNOSIS — L821 Other seborrheic keratosis: Secondary | ICD-10-CM | POA: Diagnosis not present

## 2018-03-18 NOTE — Progress Notes (Addendum)
[facility]  Timothy [name]: Timothy [name] DOB: May 15, 1961   REASON FOR VISIT: Follow-up for small fiber neuropathy HISTORY FROM:  Timothy [name]    HISTORY OF PRESENT ILLNESS:UPDATE 4/26/2019CM Timothy [name],, 57 year old [name] returns for follow-up with history of neuropathy in both feet worse over the last 6 months.  Timothy [name] continues to have numbness feelings in the left arm leg and foot which are fairly constant no change in the right side.  Timothy [name] was just started on gabapentin 6 months ago.  Timothy [name] is morbidly obese and have obstructive sleep apnea using CPAP.  His CPAP is followed by primary care.  Timothy [name] continues to work full-time and travels for his job.  Timothy [name] returns for reevaluation   UPDATE 10/25/2018CM Timothy [name], 57 year old [name] returns for follow-up for small fiber neuropathy.Timothy [name] has begun having daily symptoms of neuropathy in the left foot and lack that has been progressively getting worse over the last year.Timothy [name] has never been on any medication for this.Timothy [name] feels Timothy [name] cannot be as active as previously due to burning pain in the feet. When his activity decreased his weight increased. Timothy [name] was recently diagnosed with hiatal hernia and Barrett's esophagitis.Timothy [name] has a history of obstructive sleep apnea and uses CPAP.Timothy [name] returns for reevaluation   04/12/16 VP67 year old right-handed [name] with hypertension, hypercholesteremia, sleep apnea, kidney stones, here for evaluation of back pain and numbness in the feet. Symptoms present for at least 6 months.  For past 6 months has had midline low back pain. Symptoms worse when Timothy [name] sleeps for a long time. Worsened early morning. Timothy [name] improved after activity. No radiating symptoms. No specific triggering factors. No history of severe accident or trauma.  Around the same time Timothy [name] has also noted numbness and tingling in the balls of the feet, left worse than right side. Symptoms seem to be independent from back pain problems. Timothy [name] has had lab screening testing  with B12, TSH, A1c which were unremarkable.    REVIEW OF SYSTEMS: Full 14 system review of systems performed and notable only for those listed, all others are neg:  Constitutional: fatigue Cardiovascular: leg swelling Ear/Nose/Throat: neg  Skin: neg Eyes: neg Respiratory: neg Gastroitestinal: neg  Hematology/Lymphatic: neg  Endocrine: neg Musculoskeletal:neg Allergy/Immunology: environmental allergies Neurological: burning pain in feet, numbness Psychiatric: neg Sleep : obstructive sleep apnea with CPAP   ALLERGIES: No Known Allergies  HOME MEDICATIONS: Outpatient Medications Prior to Visit  Medication Sig Dispense Refill  . aspirin EC 81 MG tablet Take 81 mg by mouth daily.    . bisoprolol-hydrochlorothiazide (ZIAC) 5-6.25 MG tablet Take 1 tablet by mouth daily. 90 tablet 3  . cetirizine (ZYRTEC) 10 MG tablet Take 10 mg by mouth daily as needed for allergies.     Marland Kitchen doxazosin (CARDURA XL) 4 MG Timothy hr tablet Take 1 tablet (4 mg total) by mouth daily with breakfast. 90 tablet 3  . gabapentin (NEURONTIN) 300 MG capsule Take 1 capsule (300 mg total) by mouth at bedtime. 180 capsule 1  . Multiple Vitamin (MULTIVITAMIN) tablet Take 1 tablet by mouth daily.    . Naproxen Sodium (ALEVE PO) Take 1-2 tablets by mouth 2 (two) times daily as needed. As needed     . omeprazole (PRILOSEC) 40 MG capsule TAKE 1 CAPSULE TWICE DAILY BEFORE A MEAL. TAKE 20-30  MINUTES BEFORE BREAKFAST   AND DINNER MEALS 180 capsule 2  . pravastatin (PRAVACHOL) 80 MG tablet Take 80 mg by mouth daily.    Marland Kitchen omeprazole (PRILOSEC) 40 MG capsule Take 1 capsule (  40 mg total) by mouth 2 (two) times daily before a meal. Take 20-30 minutes before breakfast and dinner meals 180 capsule 2   No facility-administered medications prior to visit.     PAST MEDICAL HISTORY: Past Medical History:  Diagnosis Date  . Allergy   . Asthma   . Barrett's esophagus   . BPH (benign prostatic hyperplasia)   . Fibromyalgia   . GERD  (gastroesophageal reflux disease)   . History of hiatal hernia   . History of kidney stones   . Hyperlipidemia   . Hypertension   . Kidney stones   . Peripheral neuropathy   . Sleep apnea    CPAP 100%  . Small fiber neuropathy     PAST SURGICAL HISTORY: Past Surgical History:  Procedure Laterality Date  . arm surgery Right 2005  . KNEE SURGERY Bilateral 1976  . LIPOMA EXCISION N/A 11/06/2016   Procedure: EXCISION LIPOMA ABDOMINAL WALL;  Surgeon: Clovis Riley, MD;  Location: [facility];  Service: General;  Laterality: N/A;  . NASAL SINUS SURGERY  2001  . 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 Heart disease Father   . Diabetes Father   . Skin cancer Father   . Heart disease Paternal Grandmother   . Valvular heart disease Brother   . Chiari malformation Daughter   . Other Daughter        Noonan syndrome  . Heart disease Paternal Grandfather   . Heart disease Mother   . Colon cancer Neg Hx   . Stomach cancer Neg Hx   . Rectal cancer Neg Hx   . Esophageal cancer Neg Hx     SOCIAL HISTORY: Social History   Socioeconomic History  . Marital status: Married    Spouse name: Angelita Ingles  . Number of children: 2  . Years of education: 74  . Highest education level: Not on file  Occupational History  . Occupation: Actuary    Comment: [employer]  Social Needs  . Financial resource strain: Not on file  . Food insecurity:    Worry: Not on file    Inability: Not on file  . Transportation needs:    Medical: Not on file    Non-medical: Not on file  Tobacco Use  . Smoking status: Never Smoker  . Smokeless tobacco: Never Used  Substance and Sexual Activity  . Alcohol use: Yes    Alcohol/week: 0.0 oz    Comment: 2 per month  . Drug use: No  . Sexual activity: Not on file  Lifestyle  . Physical activity:    Days per week: Not on file    Minutes per session: Not on file  .  Stress: Not on file  Relationships  . Social connections:    Talks on phone: Not on file    Gets together: Not on file    Attends religious service: Not on file    Active member of club or organization: Not on file    Attends meetings of clubs or organizations: Not on file    Relationship status: Not on file  . Intimate partner violence:    Fear of current or ex partner: Not on file    Emotionally abused: Not on file    Physically abused: Not on file    Forced sexual activity: Not on file  Other Topics Concern  . Not on file  Social History Narrative   Lives with wife   Caffeine use- coffee 2 cups daily     PHYSICAL EXAM  Vitals:   03/20/18 0755  BP: 116/67  Pulse: (!) 57  Weight: 263 lb (119.3 kg)  Height: 5\' 10"  (1.778 m)   Body mass index is 37.74 kg/m.  Generalized: Well developed, obese [name] in no acute distress  Head: normocephalic and atraumatic,. Oropharynx benign  Neck: Supple,  Musculoskeletal: No deformity   Neurological examination   Mentation: Alert oriented to time, place, history taking. Attention span and concentration appropriate. Recent and remote memory intact.  Follows all commands speech and language fluent.   Cranial nerve II-XII: Pupils were equal round reactive to light extraocular movements were full, visual field were full on confrontational test. Facial sensation and strength were normal. hearing was intact to finger rubbing bilaterally. Uvula tongue midline. head turning and shoulder shrug were normal and symmetric.Tongue protrusion into cheek strength was normal. Motor: normal bulk and tone, full strength in the BUE, BLE,  Sensory: normal and symmetric to light touch, Vibration, and  Temperature decreased pinprick in both feet  Coordination: finger-nose-finger, heel-to-shin bilaterally, no dysmetria Reflexes: Brachioradialis 2/2, biceps 2/2, triceps 2/2, patellar 2/2, Achilles 2/2, plantar responses were flexor bilaterally. Gait and  Station: Rising up from seated position without assistance, normal stance,  moderate stride, good arm swing, smooth turning, able to perform tiptoe, and heel walking without difficulty. Tandem gait is unsteady. Romberg negative  DIAGNOSTIC DATA (LABS, IMAGING, TESTING) - I reviewed Timothy [name] records, labs, notes, testing and imaging myself where available.  Lab Results  Component Value Date   WBC 7.4 02/21/2017   HGB 13.2 02/21/2017   HCT 40.6 02/21/2017   MCV 80.7 02/21/2017   PLT 263 02/21/2017      Component Value Date/Time   NA 139 02/21/2017 2335   K 3.8 02/21/2017 2335   CL 105 02/21/2017 2335   CO2 26 02/21/2017 2335   GLUCOSE 134 (H) 02/21/2017 2335   BUN 15 02/21/2017 2335   CREATININE 1.11 02/21/2017 2335   CALCIUM 9.0 02/21/2017 2335   GFRNONAA >60 02/21/2017 2335   GFRAA >60 02/21/2017 2335    ASSESSMENT AND PLAN Timothy [name] here with  lower extremity numbness and tingling for past 1.5 years.  The lower extremity/feet numbness and tingling may be related to small fiber neuropathy. EMG nerve conduction returned normal without evidence of large fiber neuropathy.The Timothy [name] is a current Timothy [name] of Dr. Leta Baptist  who is out of the office today . This note is sent to the work in doctor.        PLAN:Increase  gabapentin 300 mg twice daily  medication may make you drowsy,  dizzy or have blurred vision,did not do anything that needs minimal alertness until you  know how this medication affects you , alcohol can increase drowsiness and dizziness  Continue multivitamin Follow-up in 6 months I spent 20 minutes in total face to face time with the Timothy [name] more than 50% of which was spent counseling and coordination of care, reviewing test results reviewing medications and discussing and reviewing the diagnosis of peripheral neuropathy and further treatment options. , Dennie Bible, [facility], [facility], APRN  [facility] [address] [place], [place] [address] [phone]  I reviewed the above note and documentation by the Nurse Practitioner and agree with the history, physical exam, assessment and plan as outlined above. I was immediately available for face-to-face consultation. Saima  Rexene Alberts, MD, PhD [facility])

## 2018-03-20 ENCOUNTER — Ambulatory Visit (INDEPENDENT_AMBULATORY_CARE_PROVIDER_SITE_OTHER): Payer: BLUE CROSS/BLUE SHIELD | Admitting: Nurse Practitioner

## 2018-03-20 ENCOUNTER — Encounter: Payer: Self-pay | Admitting: Nurse Practitioner

## 2018-03-20 VITALS — BP 116/67 | HR 57 | Ht 70.0 in | Wt 263.0 lb

## 2018-03-20 DIAGNOSIS — R7303 Prediabetes: Secondary | ICD-10-CM | POA: Diagnosis not present

## 2018-03-20 DIAGNOSIS — E785 Hyperlipidemia, unspecified: Secondary | ICD-10-CM | POA: Diagnosis not present

## 2018-03-20 DIAGNOSIS — I10 Essential (primary) hypertension: Secondary | ICD-10-CM | POA: Diagnosis not present

## 2018-03-20 DIAGNOSIS — G6289 Other specified polyneuropathies: Secondary | ICD-10-CM | POA: Diagnosis not present

## 2018-03-20 DIAGNOSIS — Z0000 Encounter for general adult medical examination without abnormal findings: Secondary | ICD-10-CM | POA: Diagnosis not present

## 2018-03-20 DIAGNOSIS — Z125 Encounter for screening for malignant neoplasm of prostate: Secondary | ICD-10-CM | POA: Diagnosis not present

## 2018-03-20 MED ORDER — GABAPENTIN 300 MG PO CAPS: 300.0000 mg | ORAL_CAPSULE | Freq: Two times a day (BID) | ORAL | 0 refills | Status: DC

## 2018-03-20 MED ORDER — GABAPENTIN 300 MG PO CAPS: 300.0000 mg | ORAL_CAPSULE | Freq: Two times a day (BID) | ORAL | 2 refills | Status: DC

## 2018-03-20 NOTE — Patient Instructions (Signed)
Increase  gabapentin 300 mg twice daily  medication may make you drowsy,  dizzy or have blurred vision,did not do anything that needs minimal alertness until you  know how this medication affects you , alcohol can increase drowsiness and dizziness  Continue multivitamin Follow-up in 6 months

## 2018-04-19 ENCOUNTER — Other Ambulatory Visit: Payer: Self-pay | Admitting: Cardiology

## 2018-05-15 DIAGNOSIS — L03031 Cellulitis of right toe: Secondary | ICD-10-CM | POA: Diagnosis not present

## 2018-05-25 DIAGNOSIS — L600 Ingrowing nail: Secondary | ICD-10-CM | POA: Diagnosis not present

## 2018-06-06 DIAGNOSIS — J189 Pneumonia, unspecified organism: Secondary | ICD-10-CM | POA: Diagnosis not present

## 2018-06-06 DIAGNOSIS — R509 Fever, unspecified: Secondary | ICD-10-CM | POA: Diagnosis not present

## 2018-06-10 DIAGNOSIS — L089 Local infection of the skin and subcutaneous tissue, unspecified: Secondary | ICD-10-CM | POA: Diagnosis not present

## 2018-06-10 DIAGNOSIS — J189 Pneumonia, unspecified organism: Secondary | ICD-10-CM | POA: Diagnosis not present

## 2018-06-10 DIAGNOSIS — R319 Hematuria, unspecified: Secondary | ICD-10-CM | POA: Diagnosis not present

## 2018-06-12 DIAGNOSIS — N390 Urinary tract infection, site not specified: Secondary | ICD-10-CM | POA: Diagnosis not present

## 2018-07-10 ENCOUNTER — Ambulatory Visit
Admission: RE | Admit: 2018-07-10 | Discharge: 2018-07-10 | Disposition: A | Discharge status: Home / Self Care | Payer: BLUE CROSS/BLUE SHIELD | Source: Ambulatory Visit | Attending: Family Medicine | Admitting: Family Medicine

## 2018-07-10 ENCOUNTER — Other Ambulatory Visit: Payer: Self-pay | Admitting: Family Medicine

## 2018-07-10 DIAGNOSIS — J189 Pneumonia, unspecified organism: Secondary | ICD-10-CM

## 2018-07-10 DIAGNOSIS — L578 Other skin changes due to chronic exposure to nonionizing radiation: Secondary | ICD-10-CM | POA: Diagnosis not present

## 2018-07-10 DIAGNOSIS — L821 Other seborrheic keratosis: Secondary | ICD-10-CM | POA: Diagnosis not present

## 2018-07-10 DIAGNOSIS — D229 Melanocytic nevi, unspecified: Secondary | ICD-10-CM | POA: Diagnosis not present

## 2018-07-10 DIAGNOSIS — L814 Other melanin hyperpigmentation: Secondary | ICD-10-CM | POA: Diagnosis not present

## 2018-07-23 DIAGNOSIS — G4733 Obstructive sleep apnea (adult) (pediatric): Secondary | ICD-10-CM | POA: Diagnosis not present

## 2018-09-02 ENCOUNTER — Other Ambulatory Visit: Payer: Self-pay | Admitting: Cardiology

## 2018-09-17 NOTE — Progress Notes (Signed)
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  He is currently on gabapentin.  This was started about 1 year ago he says his symptoms have gone away on the right side but he still feels numbness and tingling in his left leg and foot he is only taking gabapentin 300 at night the morning dose makes him too drowsy.  He also has a history of obesity obstructive sleep apnea using CPAP.  He also has an area on his left knee at the back which he says swells at times.  He returns for reevaluation   UPDATE 4/26/2019CM Timothy [name],, 57 year old male returns for follow-up with history of neuropathy in both feet worse over the last 6 months.  He continues to have numbness feelings in the left arm leg and foot which are fairly constant no change in the right side.  He was just started on gabapentin 6 months ago.  He is morbidly obese and have obstructive sleep apnea using CPAP.  His CPAP is followed by primary care.  He continues to work full-time and travels for his job.  He returns for reevaluation   UPDATE 10/25/2018CM Timothy [name], 57 year old male returns for follow-up for small fiber neuropathy.he has begun having daily symptoms of neuropathy in the left foot and lack that has been progressively getting worse over the last year.He has never been on any medication for this.He feels he cannot be as active as previously due to burning pain in the feet. When his activity decreased his weight increased. He was recently diagnosed with hiatal hernia and Barrett's esophagitis.He has a history of obstructive sleep apnea and uses CPAP.He returns for reevaluation   04/12/16 VP53 year old right-handed male with hypertension, hypercholesteremia, sleep  apnea, kidney stones, here for evaluation of back pain and numbness in the feet. Symptoms present for at least 6 months.  For past 6 months has had midline low back pain. Symptoms worse when he sleeps for a long time. Worsened early morning. Patient improved after activity. No radiating symptoms. No specific triggering factors. No history of severe accident or trauma.  Around the same time patient has also noted numbness and tingling in the balls of the feet, left worse than right side. Symptoms seem to be independent from back pain problems. Patient has had lab screening testing with B12, TSH, A1c which were unremarkable.    REVIEW OF SYSTEMS: Full 14 system review of systems performed and notable only for those listed, all others are neg:  Constitutional: fatigue Cardiovascular: leg swelling Ear/Nose/Throat: neg  Skin: neg Eyes: neg Respiratory: neg Gastroitestinal: neg  Hematology/Lymphatic: neg  Endocrine: neg Musculoskeletal:neg Allergy/Immunology: environmental allergies Neurological: burning pain in feet, numbness Psychiatric: neg Sleep : obstructive sleep apnea with CPAP   ALLERGIES: No Known Allergies  HOME MEDICATIONS: Outpatient Medications Prior to Visit  Medication Sig Dispense Refill  . aspirin EC 81 MG tablet Take 81 mg by mouth daily.    . bisoprolol-hydrochlorothiazide (ZIAC) 5-6.25 MG tablet Take 1 tablet by mouth daily. Please keep upcoming appt in October for future refills. Thank you. 90 tablet 0  . cetirizine (ZYRTEC) 10 MG tablet Take 10 mg by mouth daily as needed for allergies.     Marland Kitchen doxazosin (CARDURA XL) 4 MG 24 hr tablet Take 1  tablet (4 mg total) by mouth daily with breakfast. 90 tablet 3  . gabapentin (NEURONTIN) 300 MG capsule Take 1 capsule (300 mg total) by mouth 2 (two) times daily. 180 capsule 2  . Multiple Vitamin (MULTIVITAMIN) tablet Take 1 tablet by mouth daily.    . Naproxen Sodium (ALEVE PO) Take 1-2 tablets by mouth 2 (two) times daily  as needed. As needed     . omeprazole (PRILOSEC) 40 MG capsule TAKE 1 CAPSULE TWICE DAILY BEFORE A MEAL. TAKE 20-30  MINUTES BEFORE BREAKFAST   AND DINNER MEALS 180 capsule 2  . pravastatin (PRAVACHOL) 80 MG tablet Take 80 mg by mouth daily.     No facility-administered medications prior to visit.     PAST MEDICAL HISTORY: Past Medical History:  Diagnosis Date  . Allergy   . Asthma   . Barrett's esophagus   . BPH (benign prostatic hyperplasia)   . Fibromyalgia   . GERD (gastroesophageal reflux disease)   . History of hiatal hernia   . History of kidney stones   . Hyperlipidemia   . Hypertension   . Kidney stones   . Peripheral neuropathy   . Sleep apnea    CPAP 100%  . Small fiber neuropathy     PAST SURGICAL HISTORY: Past Surgical History:  Procedure Laterality Date  . arm surgery Right 2005  . KNEE SURGERY Bilateral 1976  . LIPOMA EXCISION N/A 11/06/2016   Procedure: EXCISION LIPOMA ABDOMINAL WALL;  Surgeon: Clovis Riley, MD;  Location: [facility];  Service: General;  Laterality: N/A;  . NASAL SINUS SURGERY  2001  . 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 Heart disease Father   . Diabetes Father   . Skin cancer Father   . Heart disease Paternal Grandmother   . Valvular heart disease Brother   . Chiari malformation Daughter   . Other Daughter        Noonan syndrome  . Heart disease Paternal Grandfather   . Heart disease Mother   . Colon cancer Neg Hx   . Stomach cancer Neg Hx   . Rectal cancer Neg Hx   . Esophageal cancer Neg Hx     SOCIAL HISTORY: Social History   Socioeconomic History  . Marital status: Married    Spouse name: Angelita Ingles  . Number of children: 2  . Years of education: 36  . Highest education level: Not on file  Occupational History  . Occupation: Actuary    Comment: [employer]  Social Needs  . Financial resource strain: Not on file    . Food insecurity:    Worry: Not on file    Inability: Not on file  . Transportation needs:    Medical: Not on file    Non-medical: Not on file  Tobacco Use  . Smoking status: Never Smoker  . Smokeless tobacco: Never Used  Substance and Sexual Activity  . Alcohol use: Yes    Alcohol/week: 0.0 standard drinks    Comment: 2 per month  . Drug use: No  . Sexual activity: Not on file  Lifestyle  . Physical activity:    Days per week: Not on file    Minutes per session: Not on file  . Stress: Not on file  Relationships  . Social connections:    Talks on phone: Not on file    Gets together: Not on  file    Attends religious service: Not on file    Active member of club or organization: Not on file    Attends meetings of clubs or organizations: Not on file    Relationship status: Not on file  . Intimate partner violence:    Fear of current or ex partner: Not on file    Emotionally abused: Not on file    Physically abused: Not on file    Forced sexual activity: Not on file  Other Topics Concern  . Not on file  Social History Narrative   Lives with wife   Caffeine use- coffee 2 cups daily     PHYSICAL EXAM  Vitals:   09/18/18 0802  BP: 103/62  Pulse: (!) 49  Weight: 262 lb 12.8 oz (119.2 kg)  Height: 5\' 10"  (1.778 m)   Body mass index is 37.71 kg/m.  Generalized: Well developed, obese male in no acute distress  Head: normocephalic and atraumatic,. Oropharynx benign  Neck: Supple,  Musculoskeletal: No deformity  Skin 1+ edema both lower extremities Neurological examination   Mentation: Alert oriented to time, place, history taking. Attention span and concentration appropriate. Recent and remote memory intact.  Follows all commands speech and language fluent.   Cranial nerve II-XII: Pupils were equal round reactive to light extraocular movements were full, visual field were full on confrontational test. Facial sensation and strength were normal. hearing was intact  to finger rubbing bilaterally. Uvula tongue midline. head turning and shoulder shrug were normal and symmetric.Tongue protrusion into cheek strength was normal. Motor: normal bulk and tone, full strength in the BUE, BLE,  Sensory: normal and symmetric to light touch, Vibration, and  Temperature decreased pinprick in both feet  Coordination: finger-nose-finger, heel-to-shin bilaterally, no dysmetria Reflexes: Symmetric upper and lower, plantar responses were flexor bilaterally. Gait and Station: Rising up from seated position without assistance, normal stance,  moderate stride, good arm swing, smooth turning, able to perform tiptoe, and heel walking without difficulty. Tandem gait is unsteady. Romberg negative  DIAGNOSTIC DATA (LABS, IMAGING, TESTING) - I reviewed patient records, labs, notes, testing and imaging myself where available.  Lab Results  Component Value Date   WBC 7.4 02/21/2017   HGB 13.2 02/21/2017   HCT 40.6 02/21/2017   MCV 80.7 02/21/2017   PLT 263 02/21/2017      Component Value Date/Time   NA 139 02/21/2017 2335   K 3.8 02/21/2017 2335   CL 105 02/21/2017 2335   CO2 26 02/21/2017 2335   GLUCOSE 134 (H) 02/21/2017 2335   BUN 15 02/21/2017 2335   CREATININE 1.11 02/21/2017 2335   CALCIUM 9.0 02/21/2017 2335   GFRNONAA >60 02/21/2017 2335   GFRAA >60 02/21/2017 2335    ASSESSMENT AND PLAN 57 y.o. year old male here with  lower extremity numbness and tingling for past 2years.  The lower extremity/feet numbness and tingling may be related to small fiber neuropathy. EMG nerve conduction returned normal without evidence of large fiber neuropathy.     PLAN:Can take extra dose of Gabapentin at night if needed  Continue multivitamin Keep legs elevated when sitting Discussed with your primary care cardiologist about your swelling in your legs Follow-up in 8 months I spent 20 minutes in total face to face time with the patient more than 50% of which was spent  counseling and coordination of care, reviewing test results reviewing medications and discussing and reviewing the diagnosis of peripheral neuropathy and further treatment options. , Dennie Bible,  [place], [facility], [facility] [address], [place], [place] [address] [phone]  I reviewed the above note and documentation by the Nurse Practitioner and agree with the history, physical exam, assessment and plan as outlined above. I was immediately available for face-to-face consultation. Star Age, MD, PhD [facility])

## 2018-09-18 ENCOUNTER — Ambulatory Visit (INDEPENDENT_AMBULATORY_CARE_PROVIDER_SITE_OTHER): Payer: BLUE CROSS/BLUE SHIELD | Admitting: Nurse Practitioner

## 2018-09-18 ENCOUNTER — Other Ambulatory Visit: Payer: Self-pay | Admitting: Family Medicine

## 2018-09-18 ENCOUNTER — Encounter: Payer: Self-pay | Admitting: Nurse Practitioner

## 2018-09-18 VITALS — BP 103/62 | HR 49 | Ht 70.0 in | Wt 262.8 lb

## 2018-09-18 DIAGNOSIS — G629 Polyneuropathy, unspecified: Secondary | ICD-10-CM | POA: Diagnosis not present

## 2018-09-18 DIAGNOSIS — R609 Edema, unspecified: Secondary | ICD-10-CM | POA: Diagnosis not present

## 2018-09-18 DIAGNOSIS — M79602 Pain in left arm: Secondary | ICD-10-CM | POA: Diagnosis not present

## 2018-09-18 DIAGNOSIS — I10 Essential (primary) hypertension: Secondary | ICD-10-CM | POA: Diagnosis not present

## 2018-09-18 DIAGNOSIS — G4733 Obstructive sleep apnea (adult) (pediatric): Secondary | ICD-10-CM | POA: Diagnosis not present

## 2018-09-18 DIAGNOSIS — R600 Localized edema: Secondary | ICD-10-CM | POA: Diagnosis not present

## 2018-09-18 DIAGNOSIS — G6289 Other specified polyneuropathies: Secondary | ICD-10-CM

## 2018-09-18 MED ORDER — GABAPENTIN 300 MG PO CAPS: 300.0000 mg | ORAL_CAPSULE | Freq: Two times a day (BID) | ORAL | 1 refills | Status: DC

## 2018-09-18 NOTE — Patient Instructions (Signed)
Can take extra dose of Gabapentin at night if needed  Continue multivitamin Follow-up in 8 months

## 2018-09-21 IMAGING — CT CT ANGIO CHEST-ABD-PELV FOR DISSECTION W/ AND WO/W CM
2 of 7 series · 14 of 46 positions shown, 16 images · IV contrast (OMNI 350)
Comparison: Chest radiographs earlier this day

CLINICAL DATA: Chest pain radiating into the arms. Left arm
tingling.

EXAM:
CT ANGIOGRAPHY CHEST, ABDOMEN AND PELVIS
TECHNIQUE: Multidetector CT imaging through the chest, abdomen and pelvis was
performed using the standard protocol during bolus administration of
intravenous contrast. Multiplanar reconstructed images and MIPs were
obtained and reviewed to evaluate the vascular anatomy.
CONTRAST:  100 cc Isovue 370 IV

[Series 8: dissection 2mm · axial · 0.91mm/px · z∈[+730,+1318]mm · 11 of 330 slices shown, 13 images]
[im 18/330  soft-tissue]
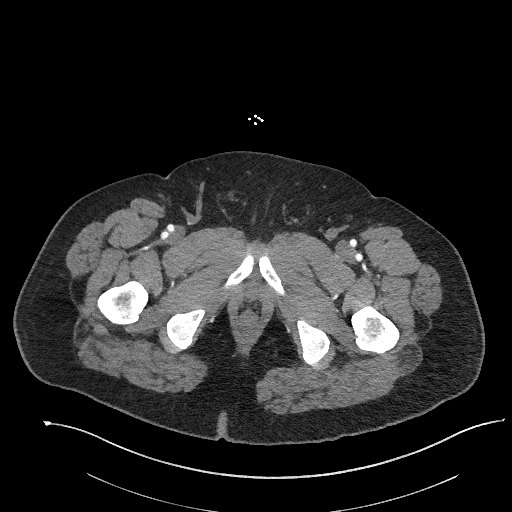
[im 18/330  bone]
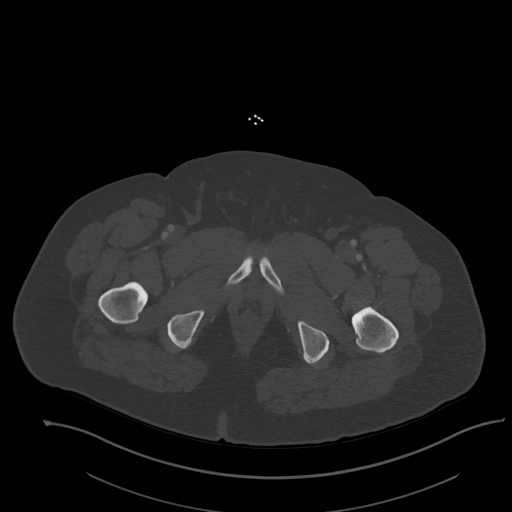
[im 52/330  soft-tissue]
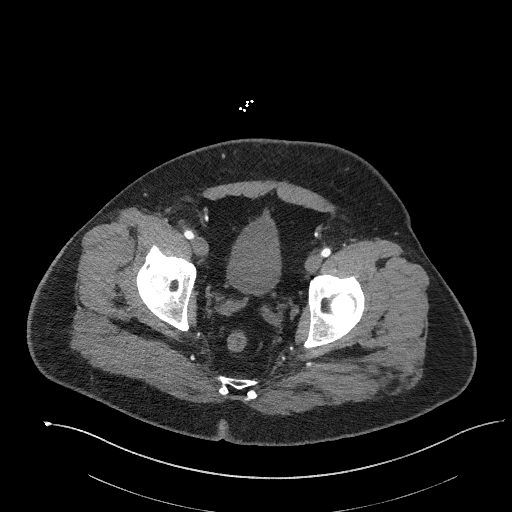
[im 87/330  soft-tissue]
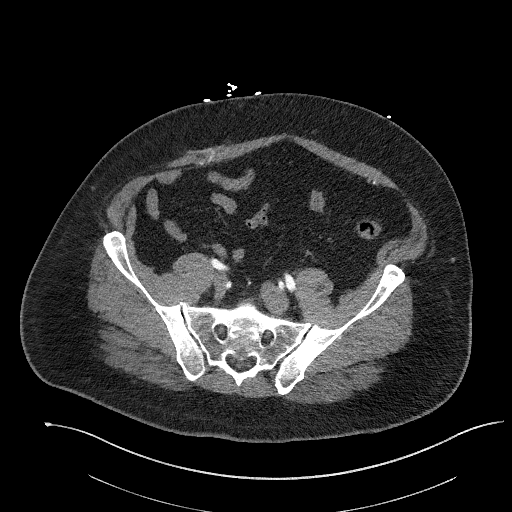
[im 104/330  soft-tissue]
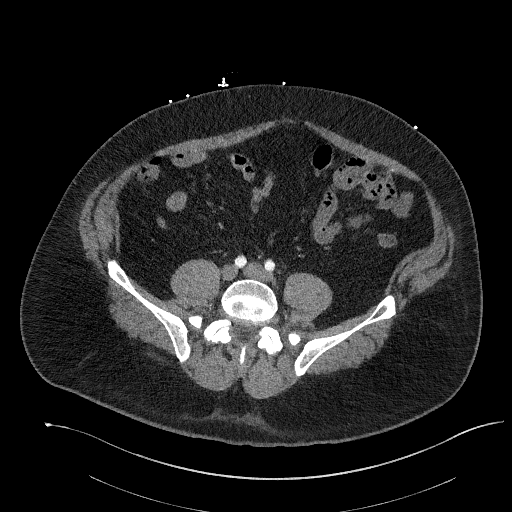
[im 139/330  soft-tissue]
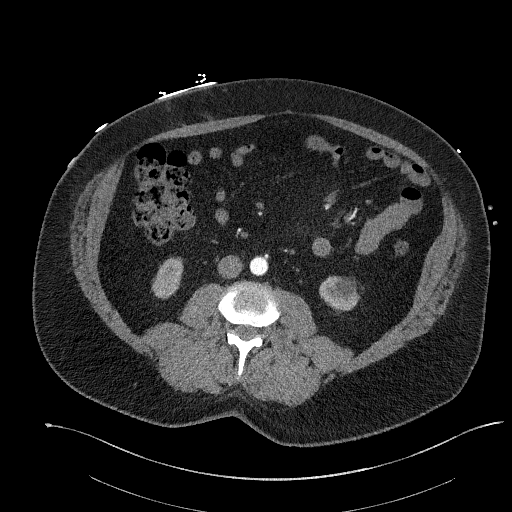
[im 174/330  soft-tissue]
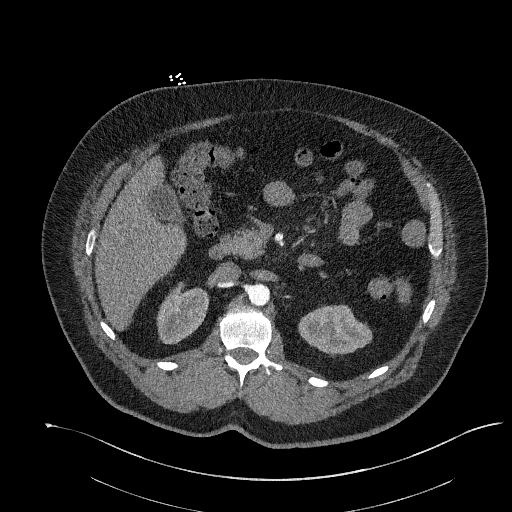
[im 191/330  soft-tissue]
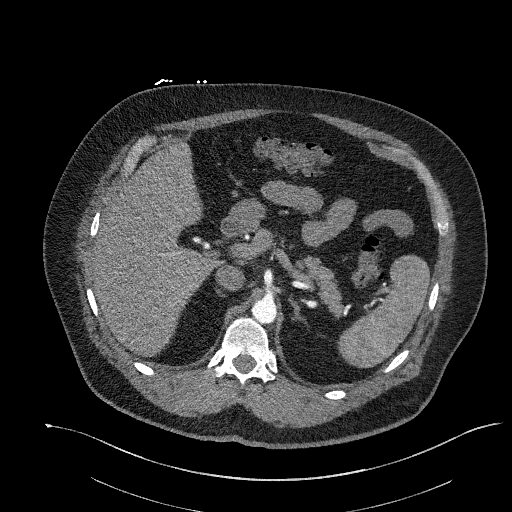
[im 226/330  soft-tissue]
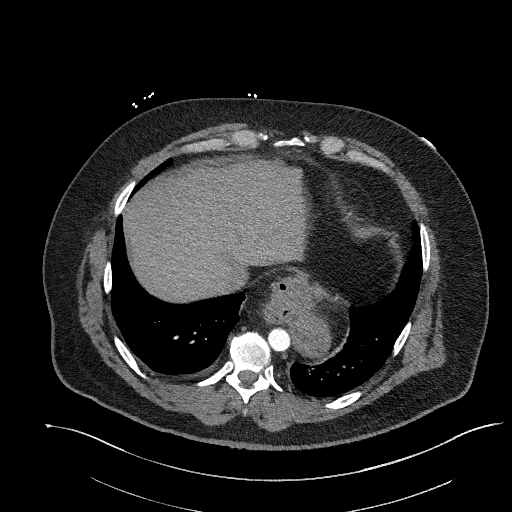
[im 243/330  soft-tissue]
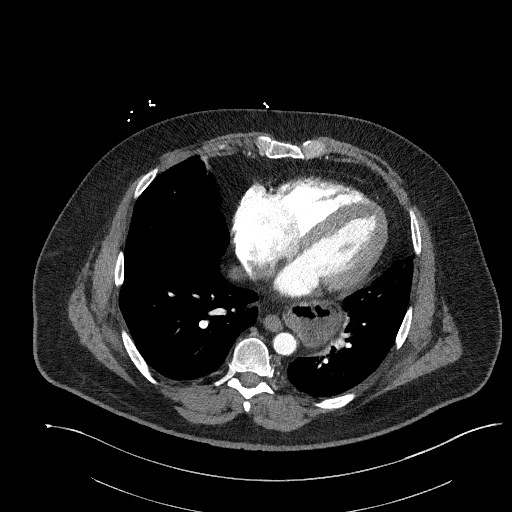
[im 243/330  bone]
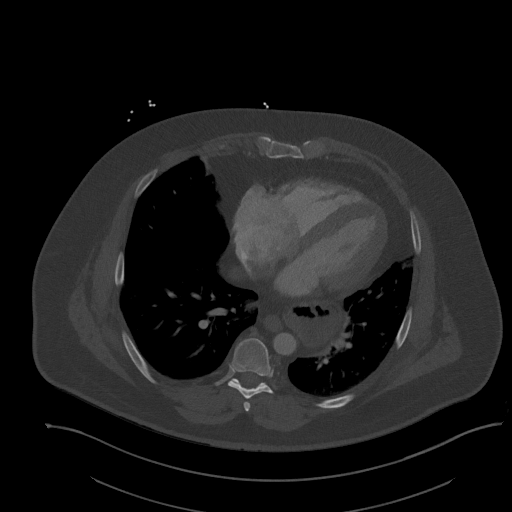
[im 278/330  soft-tissue]
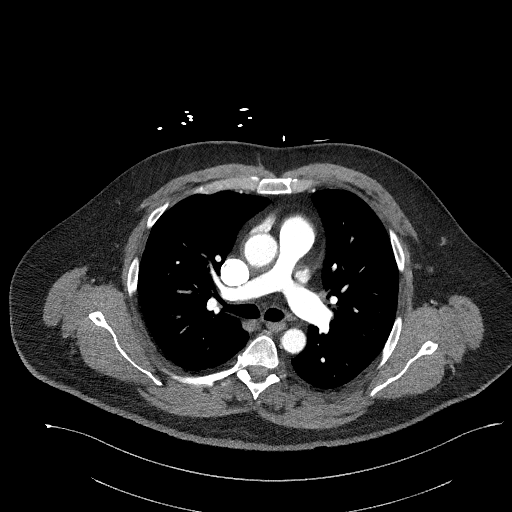
[im 312/330  soft-tissue]
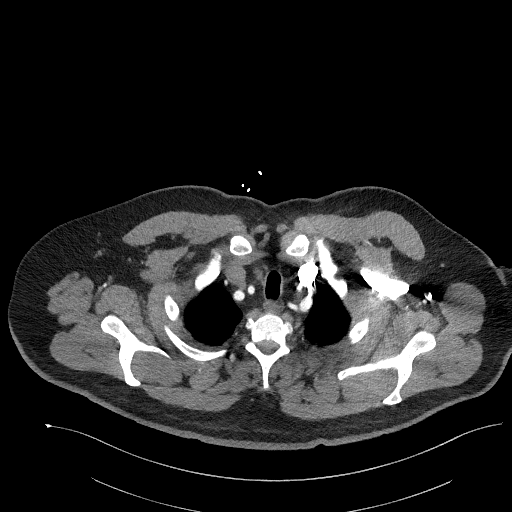

[Series 11: dissection 2mm cor · coronal · 0.89mm/px · 3 of 186 slices shown]
[im 47/186  soft-tissue]
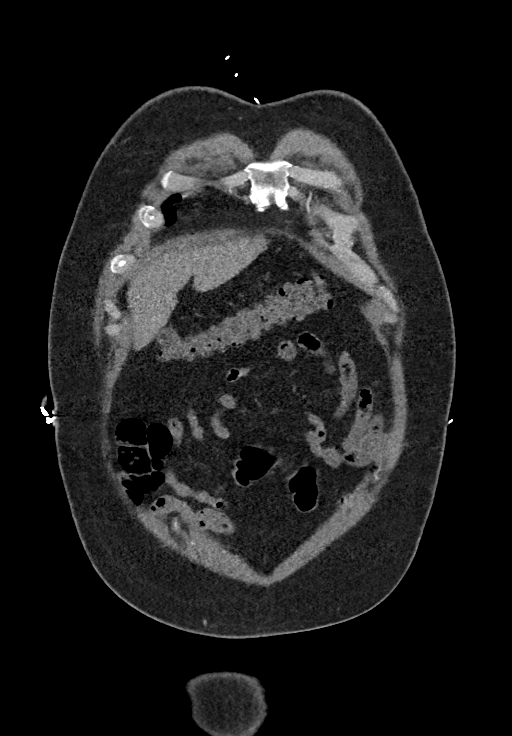
[im 93/186  soft-tissue]
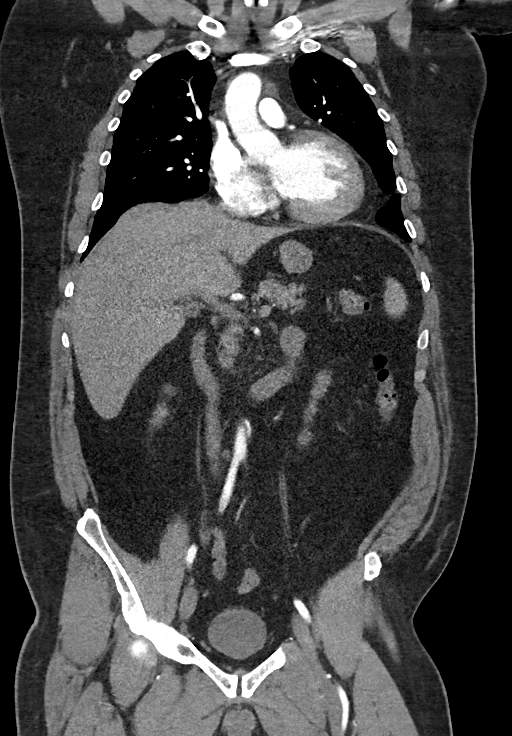
[im 139/186  soft-tissue]
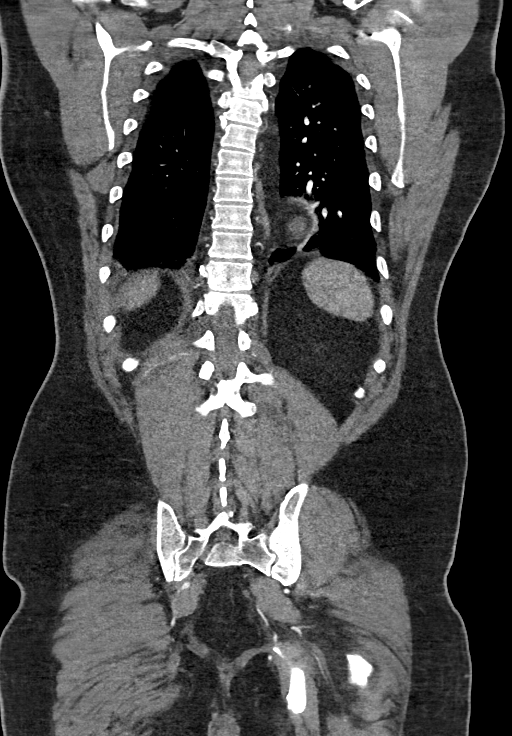

[14 of 46 positions shown; findings below may reference images not displayed]

FINDINGS: CTA CHEST FINDINGS

Cardiovascular: No aortic dissection, hyperdense hematoma or aortic
aneurysm. No significant atherosclerosis. Minimal fluid in the
superior mediastinum about the distal ascending aorta measures
simple fluid density and is felt to be fluid in the superior
pericardial recess. Minimal pericardial fluid inferiorly adjacent to
the right ventricle. No aortic wall irregularity. No filling defects
in the central pulmonary arteries to suggest pulmonary embolus. The
heart is normal in size. No coronary artery calcifications are seen.

Mediastinum/Nodes: Small to moderate-sized hiatal hernia. No
mediastinal or hilar adenopathy. Visualized thyroid gland is normal.

Lungs/Pleura: Central bronchial thickening. No consolidation. No
evidence of pulmonary edema. Mild dependent atelectasis with trace
adjacent effusions.

Musculoskeletal: There are no acute or suspicious osseous
abnormalities.

Review of the MIP images confirms the above findings.

CTA ABDOMEN AND PELVIS FINDINGS

VASCULAR

Aorta: Normal caliber aorta without aneurysm, dissection, vasculitis
or significant stenosis.

Celiac: Patent without evidence of aneurysm, dissection, vasculitis
or significant stenosis.

SMA: Patent without evidence of aneurysm, dissection, vasculitis or
significant stenosis.

Renals: Both renal arteries are patent without evidence of aneurysm,
dissection, vasculitis, fibromuscular dysplasia or significant
stenosis. Tiny accessory left upper pole renal artery.

IMA: Patent without evidence of aneurysm, dissection, vasculitis or
significant stenosis.

Inflow: Patent without evidence of aneurysm, dissection, vasculitis
or significant stenosis.

Veins: No obvious venous abnormality within the limitations of this
arterial phase study.

Review of the MIP images confirms the above findings.

NON-VASCULAR

Hepatobiliary: No focal liver abnormality is seen. No gallstones,
gallbladder wall thickening, or biliary dilatation.

Pancreas: No ductal dilatation or inflammation.

Spleen: Normal in size without focal abnormality.

Adrenals/Urinary Tract: Normal adrenal glands. No hydronephrosis or
perinephric edema. Symmetric renal enhancement. Simple cyst in the
lower left kidney. Urinary bladder is minimally distended.

Stomach/Bowel: Stomach is within normal limits. Appendix appears
normal. No evidence of bowel wall thickening, distention, or
inflammatory changes.

Lymphatic: Faint mesenteric stranding and small lymph nodes in the
left central abdomen, nonspecific. No pathologically enlarged lymph
nodes in the abdomen or pelvis.

Reproductive: Prostatic calcifications.

Other: Tiny fat containing umbilical hernia. Fat within both
inguinal canals. No free air, free fluid, or intra-abdominal fluid
collection.

Musculoskeletal: No acute or significant osseous findings.

Review of the MIP images confirms the above findings.
IMPRESSION: 1. No aortic dissection. Simple periaortic fluid about the distal
ascending aorta is felt to be pericardial fluid in the superior
pericardial recess.
2. Small to moderate hiatal hernia.
3. Bronchial thickening.
4. Mild mesenteric stranding and small lymph nodes in the left
central mesentery, nonspecific, likely reactive.

## 2018-10-02 ENCOUNTER — Ambulatory Visit (INDEPENDENT_AMBULATORY_CARE_PROVIDER_SITE_OTHER): Payer: BLUE CROSS/BLUE SHIELD | Admitting: Cardiology

## 2018-10-02 ENCOUNTER — Encounter: Payer: Self-pay | Admitting: Cardiology

## 2018-10-02 ENCOUNTER — Encounter: Payer: Self-pay | Admitting: *Deleted

## 2018-10-02 VITALS — BP 112/60 | HR 58 | Ht 70.0 in | Wt 260.4 lb

## 2018-10-02 DIAGNOSIS — R079 Chest pain, unspecified: Secondary | ICD-10-CM

## 2018-10-02 DIAGNOSIS — I10 Essential (primary) hypertension: Secondary | ICD-10-CM

## 2018-10-02 DIAGNOSIS — Z8249 Family history of ischemic heart disease and other diseases of the circulatory system: Secondary | ICD-10-CM

## 2018-10-02 DIAGNOSIS — Z01812 Encounter for preprocedural laboratory examination: Secondary | ICD-10-CM

## 2018-10-02 MED ORDER — DOXAZOSIN MESYLATE ER 4 MG PO TB24: 4.0000 mg | ORAL_TABLET | Freq: Every day | ORAL | 3 refills | Status: DC

## 2018-10-02 MED ORDER — BISOPROLOL-HYDROCHLOROTHIAZIDE 5-6.25 MG PO TABS: 1.0000 | ORAL_TABLET | Freq: Every day | ORAL | 3 refills | Status: DC

## 2018-10-02 MED ORDER — METOPROLOL TARTRATE 25 MG PO TABS: 25.0000 mg | ORAL_TABLET | Freq: Once | ORAL | 0 refills | Status: DC

## 2018-10-02 NOTE — Patient Instructions (Signed)
Medication Instructions:  The current medical regimen is effective;  continue present plan and medications.  If you need a refill on your cardiac medications before your next appointment, please call your pharmacy.   Lab work: You will need blood work before having your Coronary CT scan (BMP) If you have labs (blood work) drawn today and your tests are completely normal, you will receive your results only by: Marland Kitchen MyChart Message (if you have MyChart) OR . A paper copy in the mail If you have any lab test that is abnormal or we need to change your treatment, we will call you to review the results.  Testing/Procedures: Your physician has requested that you have Coronary CT. Cardiac computed tomography (CT) is a painless test that uses an x-ray machine to take clear, detailed pictures of your heart. For further information please visit [email or web address]. Please follow instruction sheet as given.  Follow-Up: Follow up will be based on the results of the above testing.  Thank you for choosing [facility]!!

## 2018-10-02 NOTE — Progress Notes (Addendum)
Cardiology Office Note:    Date:  10/02/2018   ID:  Timothy [name], DOB January 06, 1961, MRN [id number]  PCP:  Timothy Pepper, MD  Cardiologist:  No primary care provider on file.  Electrophysiologist:  None   Referring MD: Timothy Pepper, MD     History of Present Illness:    Timothy [name] is a 57 y.o. male here for follow-up of chest pain with family history of CAD brother who died MI 45.  At last visit EGD was noteworthy for esophagitis.  Source of chest pain.  Also having some neuropathic pain like carpal tunnel syndrome.  Tingling left arm, hand.  He is concerned about this is possible anginal equivalent.  This seems to come and go and sometimes seems to be exertional related.  Increasing in frequency.  At times, does not seem to correlate with activity.    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  Volunteers at [employer].  Past Medical History:  Diagnosis Date  . Allergy   . Asthma   . Barrett's esophagus   . BPH (benign prostatic hyperplasia)   . Fibromyalgia   . GERD (gastroesophageal reflux disease)   . History of hiatal hernia   . History of kidney stones   . Hyperlipidemia   . Hypertension   . Kidney stones   . Peripheral neuropathy   . Sleep apnea    CPAP 100%  . Small fiber neuropathy     Past Surgical History:  Procedure Laterality Date  . arm surgery Right 2005  . KNEE SURGERY Bilateral 1976  . LIPOMA EXCISION N/A 11/06/2016   Procedure: EXCISION LIPOMA ABDOMINAL WALL;  Surgeon: Timothy Riley, MD;  Location: [facility];  Service: General;  Laterality: N/A;  . NASAL SINUS SURGERY  2001  . tumor 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 Right    2001-2002    Current Medications: Current Meds  Medication Sig  . aspirin EC 81 MG tablet Take 81 mg by mouth daily.  . bisoprolol-hydrochlorothiazide (ZIAC) 5-6.25 MG tablet Take 1 tablet by mouth daily.  . cetirizine (ZYRTEC) 10 MG tablet Take 10 mg by mouth daily as needed for allergies.   Marland Kitchen doxazosin (CARDURA XL) 4 MG 24  hr tablet Take 1 tablet (4 mg total) by mouth daily with breakfast.  . gabapentin (NEURONTIN) 300 MG capsule Take 1 capsule (300 mg total) by mouth 2 (two) times daily.  . Multiple Vitamin (MULTIVITAMIN) tablet Take 1 tablet by mouth daily.  . Naproxen Sodium (ALEVE PO) Take 1-2 tablets by mouth 2 (two) times daily as needed. As needed   . omeprazole (PRILOSEC) 40 MG capsule TAKE 1 CAPSULE TWICE DAILY BEFORE A MEAL. TAKE 20-30  MINUTES BEFORE BREAKFAST   AND DINNER MEALS  . pravastatin (PRAVACHOL) 80 MG tablet Take 80 mg by mouth daily.  . [DISCONTINUED] bisoprolol-hydrochlorothiazide (ZIAC) 5-6.25 MG tablet Take 1 tablet by mouth daily. Please keep upcoming appt in October for future refills. Thank you.  . [DISCONTINUED] doxazosin (CARDURA XL) 4 MG 24 hr tablet Take 1 tablet (4 mg total) by mouth daily with breakfast.     Allergies:   Patient has no known allergies.   Social History   Socioeconomic History  . Marital status: Married    Spouse name: Timothy [name]  . Number of children: 2  . Years of education: 43  . Highest education level: Not on file  Occupational History  . Occupation: Actuary    Comment: [employer]  Social Needs  . Financial  resource strain: Not on file  . Food insecurity:    Worry: Not on file    Inability: Not on file  . Transportation needs:    Medical: Not on file    Non-medical: Not on file  Tobacco Use  . Smoking status: Never Smoker  . Smokeless tobacco: Never Used  Substance and Sexual Activity  . Alcohol use: Yes    Alcohol/week: 0.0 standard drinks    Comment: 2 per month  . Drug use: No  . Sexual activity: Not on file  Lifestyle  . Physical activity:    Days per week: Not on file    Minutes per session: Not on file  . Stress: Not on file  Relationships  . Social connections:    Talks on phone: Not on file    Gets together: Not on file    Attends religious service: Not on file    Active member of club or organization: Not  on file    Attends meetings of clubs or organizations: Not on file    Relationship status: Not on file  Other Topics Concern  . Not on file  Social History Narrative   Lives with wife   Caffeine use- coffee 2 cups daily     Family History: The patient's family history includes Chiari malformation in his daughter; Diabetes in his father; Heart disease in his father, mother, paternal grandfather, and paternal grandmother; Other in his daughter; Skin cancer in his father; Valvular heart disease in his brother. There is no history of Colon cancer, Stomach cancer, Rectal cancer, or Esophageal cancer.  ROS:   Please see the history of present illness.    Denies any fevers chills nausea vomiting syncope bleeding all other systems reviewed and are negative.  EKGs/Labs/Other Studies Reviewed:    The following studies were reviewed today:  Nuclear stress test 08/08/16 at [facility]-no ischemia, normal ejection fraction, low risk.   EGD: 03/19/17: Timothy [name]  - Ulcertative, reflux appearing esophagitis at the GE junction causing focal peptic stricture. I was able to pass through the focal stricture without difficulty (lumen 14-50mm) and elected NOT to dilated it given the degree of active inflammation. Biopsies were taken with a cold forceps for histology. Findings: - A medium-sized hiatal hernia was present. - The exam was otherwise without abnormality.  EKG:  EKG is  ordered today.  The ekg ordered today demonstrates 10/02/2018-sinus bradycardia 58 with no other abnormalities previous EKG showed sinus bradycardia rate 50 with nonspecific ST-T wave changes on 09/18/2018.  Recent Labs: No results found for requested labs within last 8760 hours.  Recent Lipid Panel No results found for: CHOL, TRIG, HDL, CHOLHDL, VLDL, LDLCALC, LDLDIRECT  Physical Exam:    VS:  BP 112/60   Pulse (!) 58   Ht 5\' 10"  (1.778 m)   Wt 260 lb 6.4 oz (118.1 kg)   BMI 37.36 kg/m     Wt Readings  from Last 3 Encounters:  10/02/18 260 lb 6.4 oz (118.1 kg)  09/18/18 262 lb 12.8 oz (119.2 kg)  03/20/18 263 lb (119.3 kg)     GEN: Obese well nourished, well developed in no acute distress HEENT: Normal NECK: No JVD; No carotid bruits LYMPHATICS: No lymphadenopathy CARDIAC: RRR, no murmurs, rubs, gallops RESPIRATORY:  Clear to auscultation without rales, wheezing or rhonchi  ABDOMEN: Soft, non-tender, non-distended MUSCULOSKELETAL:  No edema; No deformity, does not seem to have any strength or loss of strength in his arms bilaterally. SKIN: Warm  and dry NEUROLOGIC:  Alert and oriented x 3 PSYCHIATRIC:  Normal affect   ASSESSMENT:    1. Chest pain, unspecified type   2. Essential hypertension   3. Family history of coronary artery disease in brother   58. Pre-procedure lab exam    PLAN:    In order of problems listed above:  Chest discomfort/left arm discomfort - In the past EGD was conclusive for esophagitis, because of chest pain at that time.  Low-grade, pain between the shoulders lasting all day long.  Also had some lumbar spine issues in the past.  Complete and full work-up in [place] troponins were negative in the past and CT showed no evidence of dissection or PE nuclear stress test low risk. -Now seems to be having intermittent left arm discomfort that is worrisome to him.  With his brother dying at age 70 from MI and his multiple risk factors including morbid obesity hypertension and hyperlipidemia I think that we should pursue coronary CT with FFR analysis to exclude the possibility of severe coronary artery disease.  Family history of CAD - Brother MI 57 and mother died shortly after open heart surgery.  Morbid obesity -BMI greater than 35 with 2 or more comorbidities.  Continue to work on weight loss.  Hypertension essential - Well-controlled  Hyperlipidemia -Pravastatin.   Medication Adjustments/Labs and Tests Ordered: Current medicines are reviewed at  length with the patient today.  Concerns regarding medicines are outlined above.  Orders Placed This Encounter  Procedures  . CT CORONARY MORPH W/CTA COR W/SCORE W/CA W/CM &/OR WO/CM  . CT CORONARY FRACTIONAL FLOW RESERVE DATA PREP  . CT CORONARY FRACTIONAL FLOW RESERVE FLUID ANALYSIS  . Basic metabolic panel  . EKG 12-Lead   Meds ordered this encounter  Medications  . bisoprolol-hydrochlorothiazide (ZIAC) 5-6.25 MG tablet    Sig: Take 1 tablet by mouth daily.    Dispense:  90 tablet    Refill:  3  . doxazosin (CARDURA XL) 4 MG 24 hr tablet    Sig: Take 1 tablet (4 mg total) by mouth daily with breakfast.    Dispense:  90 tablet    Refill:  3  . metoprolol tartrate (LOPRESSOR) 25 MG tablet    Sig: Take 1 tablet (25 mg total) by mouth once for 1 dose. Take 1 tablet 2 hours before your Coronary CT    Dispense:  1 tablet    Refill:  0    Patient Instructions  Medication Instructions:  The current medical regimen is effective;  continue present plan and medications.  If you need a refill on your cardiac medications before your next appointment, please call your pharmacy.   Lab work: You will need blood work before having your Coronary CT scan (BMP) If you have labs (blood work) drawn today and your tests are completely normal, you will receive your results only by: Marland Kitchen MyChart Message (if you have MyChart) OR . A paper copy in the mail If you have any lab test that is abnormal or we need to change your treatment, we will call you to review the results.  Testing/Procedures: Your physician has requested that you have Coronary CT. Cardiac computed tomography (CT) is a painless test that uses an x-ray machine to take clear, detailed pictures of your heart. For further information please visit [email or web address]. Please follow instruction sheet as given.  Follow-Up: Follow up will be based on the results of the above testing.  Thank you for choosing [facility]!!         Signed, Candee Furbish, MD  10/02/2018 10:22 AM    [facility]

## 2018-10-14 ENCOUNTER — Other Ambulatory Visit: Payer: Self-pay | Admitting: Gastroenterology

## 2018-10-14 DIAGNOSIS — R1310 Dysphagia, unspecified: Secondary | ICD-10-CM

## 2018-11-05 DIAGNOSIS — G4733 Obstructive sleep apnea (adult) (pediatric): Secondary | ICD-10-CM | POA: Diagnosis not present

## 2018-11-09 ENCOUNTER — Other Ambulatory Visit: Payer: BLUE CROSS/BLUE SHIELD | Admitting: *Deleted

## 2018-11-09 ENCOUNTER — Telehealth (HOSPITAL_COMMUNITY): Payer: Self-pay | Admitting: Emergency Medicine

## 2018-11-09 DIAGNOSIS — Z01812 Encounter for preprocedural laboratory examination: Secondary | ICD-10-CM

## 2018-11-09 DIAGNOSIS — Z8249 Family history of ischemic heart disease and other diseases of the circulatory system: Secondary | ICD-10-CM | POA: Diagnosis not present

## 2018-11-09 DIAGNOSIS — R079 Chest pain, unspecified: Secondary | ICD-10-CM | POA: Diagnosis not present

## 2018-11-09 DIAGNOSIS — I10 Essential (primary) hypertension: Secondary | ICD-10-CM | POA: Diagnosis not present

## 2018-11-09 LAB — BASIC METABOLIC PANEL
BUN/Creatinine Ratio: 13 (ref 9–20)
BUN: 13 mg/dL (ref 6–24)
CALCIUM: 9.3 mg/dL (ref 8.7–10.2)
CO2: 23 mmol/L (ref 20–29)
CREATININE: 1.02 mg/dL (ref 0.76–1.27)
Chloride: 105 mmol/L (ref 96–106)
GFR calc Af Amer: 93 mL/min/{1.73_m2} (ref >59)
GFR, EST NON AFRICAN AMERICAN: 81 mL/min/{1.73_m2} (ref >59)
GLUCOSE: 88 mg/dL (ref 65–99)
Potassium: 4 mmol/L (ref 3.5–5.2)
SODIUM: 142 mmol/L (ref 134–144)

## 2018-11-09 NOTE — Telephone Encounter (Signed)
RN NAV called patient to check up on labs, meds, other questions/concerns about Coronary CT tomorrow-- Patient was not aware that he needed lab work done, was not aware that he needed to take metoprolol prior to scan; pt also informed on where to park/check in day of test. Pt grateful for call and all questions answered and call back number provided -Marchia Bond

## 2018-11-10 ENCOUNTER — Ambulatory Visit (HOSPITAL_COMMUNITY)
Admission: RE | Admit: 2018-11-10 | Discharge: 2018-11-10 | Disposition: A | Discharge status: Home / Self Care | Payer: BLUE CROSS/BLUE SHIELD | Source: Ambulatory Visit | Attending: Cardiology | Admitting: Cardiology

## 2018-11-10 ENCOUNTER — Ambulatory Visit (HOSPITAL_COMMUNITY): Admission: RE | Admit: 2018-11-10 | Payer: BLUE CROSS/BLUE SHIELD | Source: Ambulatory Visit

## 2018-11-10 DIAGNOSIS — R079 Chest pain, unspecified: Secondary | ICD-10-CM | POA: Diagnosis not present

## 2018-11-10 DIAGNOSIS — Z8249 Family history of ischemic heart disease and other diseases of the circulatory system: Secondary | ICD-10-CM | POA: Insufficient documentation

## 2018-11-10 DIAGNOSIS — I10 Essential (primary) hypertension: Secondary | ICD-10-CM | POA: Insufficient documentation

## 2018-11-10 MED ORDER — IOPAMIDOL (ISOVUE-370) INJECTION 76%
100.0000 mL | Freq: Once | INTRAVENOUS | Status: AC | PRN
  Administered 2018-11-10: 100 mL via INTRAVENOUS

## 2018-11-10 MED ORDER — NITROGLYCERIN 0.4 MG SL SUBL
SUBLINGUAL_TABLET | SUBLINGUAL | Status: AC
  Filled 2018-11-10: qty 2

## 2018-11-10 MED ORDER — NITROGLYCERIN 0.4 MG SL SUBL
0.8000 mg | SUBLINGUAL_TABLET | Freq: Once | SUBLINGUAL | Status: AC
  Administered 2018-11-10: 0.8 mg via SUBLINGUAL
  Filled 2018-11-10: qty 25

## 2018-11-16 DIAGNOSIS — R3912 Poor urinary stream: Secondary | ICD-10-CM | POA: Diagnosis not present

## 2018-11-16 DIAGNOSIS — N401 Enlarged prostate with lower urinary tract symptoms: Secondary | ICD-10-CM | POA: Diagnosis not present

## 2018-11-16 DIAGNOSIS — N200 Calculus of kidney: Secondary | ICD-10-CM | POA: Diagnosis not present

## 2018-11-16 DIAGNOSIS — R3129 Other microscopic hematuria: Secondary | ICD-10-CM | POA: Diagnosis not present

## 2019-02-10 DIAGNOSIS — G4733 Obstructive sleep apnea (adult) (pediatric): Secondary | ICD-10-CM | POA: Diagnosis not present

## 2019-03-24 ENCOUNTER — Encounter: Payer: Self-pay | Admitting: *Deleted

## 2019-03-24 ENCOUNTER — Telehealth: Payer: Self-pay | Admitting: *Deleted

## 2019-03-24 MED ORDER — GABAPENTIN 300 MG PO CAPS: 300.0000 mg | ORAL_CAPSULE | Freq: Two times a day (BID) | ORAL | 1 refills | Status: DC

## 2019-03-24 NOTE — Telephone Encounter (Signed)
I called pt and offered sooner appt with Debbora Presto, NP.  Made VIRTUAL VISIT, Due to current COVID 19 pandemic, our office is severely reducing in office visits for at least the next 2 weeks, in order to minimize the risk to our patients and healthcare providers.  Pt understands that although there may be some limitations with this type of visit, we will take all precautions to reduce any security or privacy concerns.  Pt understands that this will be treated like an in office visit and we will file with pt's insurance. Pt's email is . Pt understands that the [employer] webex software must be downloaded and operational on the device pt plans to use for the visit.  Made appt 03-30-19 at 1030.  He was familiar with webex.  Chart updated.

## 2019-03-25 DIAGNOSIS — G629 Polyneuropathy, unspecified: Secondary | ICD-10-CM | POA: Diagnosis not present

## 2019-03-25 DIAGNOSIS — I10 Essential (primary) hypertension: Secondary | ICD-10-CM | POA: Diagnosis not present

## 2019-03-25 DIAGNOSIS — G4733 Obstructive sleep apnea (adult) (pediatric): Secondary | ICD-10-CM | POA: Diagnosis not present

## 2019-03-25 DIAGNOSIS — E785 Hyperlipidemia, unspecified: Secondary | ICD-10-CM | POA: Diagnosis not present

## 2019-03-30 ENCOUNTER — Encounter: Payer: Self-pay | Admitting: Family Medicine

## 2019-03-30 ENCOUNTER — Other Ambulatory Visit: Payer: Self-pay

## 2019-03-30 ENCOUNTER — Ambulatory Visit (INDEPENDENT_AMBULATORY_CARE_PROVIDER_SITE_OTHER): Payer: BLUE CROSS/BLUE SHIELD | Admitting: Family Medicine

## 2019-03-30 DIAGNOSIS — G6289 Other specified polyneuropathies: Secondary | ICD-10-CM | POA: Diagnosis not present

## 2019-03-30 MED ORDER — DULOXETINE HCL 30 MG PO CPEP: 30.0000 mg | ORAL_CAPSULE | Freq: Every day | ORAL | 1 refills | Status: DC

## 2019-03-30 NOTE — Progress Notes (Signed)
PATIENT: Timothy [name] DOB: 1961-05-27  REASON FOR VISIT: follow up HISTORY FROM: patient  Virtual Visit via Telephone Note  I connected with Timothy [name] on 03/30/19 at 10:30 AM EDT by telephone and verified that I am speaking with the correct person using two identifiers.   I discussed the limitations, risks, security and privacy concerns of performing an evaluation and management service by telephone and the availability of in person appointments. I also discussed with the patient that there may be a patient responsible charge related to this service. The patient expressed understanding and agreed to proceed.   History of Present Illness:  03/30/19 Timothy [name] is a 58 y.o. male for follow up of neuropathy.  Timothy [name] reports that neuropathy symptoms have steadily increased over the past year.  He reports both numbness and tingling in bilateral lower extremities.  At his last visit nearly a year ago he was advised to increase gabapentin to 600 mg at night.  He reports this did help for short period of time.  He can definitely tell if he misses his dose of gabapentin.  He continues to be active.  He has not correlated any worsening or improvement with activity.  He feels that neuropathy is constant and not worse at any particular time.  He is under more stress currently as his daughter has been hospitalized.  He travels frequently and spends a lot of time out of town.  He is followed closely by his PCP who has been monitoring lab work.  He reports labs have been normal.   HISTORY (copied from [employer] note on 09/18/2018)  UPDATE 10/25/2019CM [name], 59 year old male returns for follow-up with history of small fiber neuropathy in both lower extremities.  He is currently on gabapentin.  This was started about 1 year ago he says his symptoms have gone away on the right side but he still feels numbness and tingling in his left leg and foot he is only taking gabapentin 300 at night the  morning dose makes him too drowsy.  He also has a history of obesity obstructive sleep apnea using CPAP.  He also has an area on his left knee at the back which he says swells at times.  He returns for reevaluation   UPDATE 4/26/2019CM Timothy [name],, 58 year old male returns for follow-up with history of neuropathy in both feet worse over the last 6 months.  He continues to have numbness feelings in the left arm leg and foot which are fairly constant no change in the right side.  He was just started on gabapentin 6 months ago.  He is morbidly obese and have obstructive sleep apnea using CPAP.  His CPAP is followed by primary care.  He continues to work full-time and travels for his job.  He returns for reevaluation   UPDATE 10/25/2018CM Timothy [name], 58 year old male returns for follow-up for small fiber neuropathy.he has begun having daily symptoms of neuropathy in the left foot and lack that has been progressively getting worse over the last year.He has never been on any medication for this.He feels he cannot be as active as previously due to burning pain in the feet. When his activity decreased his weight increased. He was recently diagnosed with hiatal hernia and Barrett's esophagitis.He has a history of obstructive sleep apnea and uses CPAP.He returns for reevaluation   04/12/16 [name] year old right-handed male with hypertension, hypercholesteremia, sleep apnea, kidney stones, here for evaluation of back pain and numbness in the feet. Symptoms present for  at least 6 months.  For past 6 months has had midline low back pain. Symptoms worse when he sleeps for a long time. Worsened early morning. Patient improved after activity. No radiating symptoms. No specific triggering factors. No history of severe accident or trauma.  Around the same time patient has also noted numbness and tingling in the balls of the feet, left worse than right side. Symptoms seem to be independent from back pain problems.  Patient has had lab screening testing with B12, TSH, A1c which were unremarkable.   Observations/Objective:  Generalized: Well developed, in no acute distress  Mentation: Alert oriented to time, place, history taking. Follows all commands speech and language fluent   Assessment and Plan:  [age] y.o. year old male  has a past medical history of Allergy, Asthma, Barrett's esophagus, BPH (benign prostatic hyperplasia), Fibromyalgia, GERD (gastroesophageal reflux disease), History of hiatal hernia, History of kidney stones, Hyperlipidemia, Hypertension, Kidney stones, Peripheral neuropathy, Sleep apnea, and Small fiber neuropathy. here with    ICD-10-CM   1. Other polyneuropathy G62.89 DULoxetine (CYMBALTA) 30 MG capsule   We have discussed multiple treatment options to help with neuropathy pain.  I have discussed increasing gabapentin dose, however, he is fearful that this will make him sleepy and groggy during the day.  We have discussed several other medications to add to his current regimen.  He is most comfortable with Cymbalta.  We will start this dose at 30 mg daily.  I have advised that he reach out in 6 to 8 weeks with a progress report.  We may increase dose at that time if well tolerated.  He was advised of side effects noted with Cymbalta.  He denies a previous history of depression or suicidal ideations.  He was instructed to call immediately with any new concerns or unwanted side effects.  He verbalizes understanding and agreement with this plan.   No orders of the defined types were placed in this encounter.   Meds ordered this encounter  Medications  . DULoxetine (CYMBALTA) 30 MG capsule    Sig: Take 1 capsule (30 mg total) by mouth daily.    Dispense:  30 capsule    Refill:  1    Order Specific Question:   Supervising Provider    Answer:   Melvenia Beam [id number]     Follow Up Instructions:  I discussed the assessment and treatment plan with the patient. The patient  was provided an opportunity to ask questions and all were answered. The patient agreed with the plan and demonstrated an understanding of the instructions.   The patient was advised to call back or seek an in-person evaluation if the symptoms worsen or if the condition fails to improve as anticipated.  I provided 25 minutes of non-face-to-face time during this encounter. Patient is located at his place of residence during video conference.  Provider is located at her place of residence.  Liane Comber, RN helped to facilitate visit.  Debbora Presto, NP

## 2019-04-12 NOTE — Progress Notes (Signed)
I reviewed note and agree with plan.   Wendle Kina R. Delsy Etzkorn, MD 04/12/2019, 2:54 PM Certified in Neurology, Neurophysiology and Neuroimaging  [facility] [address], [address] [place], [place] [address] [phone]  

## 2019-04-23 DIAGNOSIS — N401 Enlarged prostate with lower urinary tract symptoms: Secondary | ICD-10-CM | POA: Diagnosis not present

## 2019-04-23 DIAGNOSIS — E785 Hyperlipidemia, unspecified: Secondary | ICD-10-CM | POA: Diagnosis not present

## 2019-04-23 DIAGNOSIS — R7309 Other abnormal glucose: Secondary | ICD-10-CM | POA: Diagnosis not present

## 2019-05-21 ENCOUNTER — Ambulatory Visit: Payer: BLUE CROSS/BLUE SHIELD | Admitting: Diagnostic Neuroimaging

## 2019-05-26 ENCOUNTER — Other Ambulatory Visit: Payer: Self-pay | Admitting: Family Medicine

## 2019-05-26 DIAGNOSIS — G6289 Other specified polyneuropathies: Secondary | ICD-10-CM

## 2019-05-26 MED ORDER — DULOXETINE HCL 30 MG PO CPEP: 30.0000 mg | ORAL_CAPSULE | Freq: Every day | ORAL | 1 refills | Status: DC

## 2019-05-26 NOTE — Addendum Note (Signed)
Addended by: Thomes Cake on: 05/26/2019 11:20 AM   Modules accepted: Orders

## 2019-05-26 NOTE — Telephone Encounter (Signed)
Pt has called to report the medication DULoxetine (CYMBALTA) 30 MG capsule is helping him and he would like a refill  [place] [phone] Pt states that NP Amy keeps him on this then he will need it to eventually be sent to [place]

## 2019-05-27 DIAGNOSIS — Z20828 Contact with and (suspected) exposure to other viral communicable diseases: Secondary | ICD-10-CM | POA: Diagnosis not present

## 2019-06-17 DIAGNOSIS — G4733 Obstructive sleep apnea (adult) (pediatric): Secondary | ICD-10-CM | POA: Diagnosis not present

## 2019-06-29 DIAGNOSIS — Z20828 Contact with and (suspected) exposure to other viral communicable diseases: Secondary | ICD-10-CM | POA: Diagnosis not present

## 2019-07-08 DIAGNOSIS — D1801 Hemangioma of skin and subcutaneous tissue: Secondary | ICD-10-CM | POA: Diagnosis not present

## 2019-07-08 DIAGNOSIS — L239 Allergic contact dermatitis, unspecified cause: Secondary | ICD-10-CM | POA: Diagnosis not present

## 2019-07-08 DIAGNOSIS — L821 Other seborrheic keratosis: Secondary | ICD-10-CM | POA: Diagnosis not present

## 2019-07-08 DIAGNOSIS — D225 Melanocytic nevi of trunk: Secondary | ICD-10-CM | POA: Diagnosis not present

## 2019-07-08 DIAGNOSIS — L820 Inflamed seborrheic keratosis: Secondary | ICD-10-CM | POA: Diagnosis not present

## 2019-07-08 DIAGNOSIS — D485 Neoplasm of uncertain behavior of skin: Secondary | ICD-10-CM | POA: Diagnosis not present

## 2019-07-11 ENCOUNTER — Other Ambulatory Visit: Payer: Self-pay | Admitting: Gastroenterology

## 2019-07-11 DIAGNOSIS — R1310 Dysphagia, unspecified: Secondary | ICD-10-CM

## 2019-07-29 ENCOUNTER — Other Ambulatory Visit: Payer: Self-pay | Admitting: *Deleted

## 2019-07-29 DIAGNOSIS — G6289 Other specified polyneuropathies: Secondary | ICD-10-CM

## 2019-07-29 MED ORDER — DULOXETINE HCL 30 MG PO CPEP: 30.0000 mg | ORAL_CAPSULE | Freq: Every day | ORAL | 5 refills | Status: DC

## 2019-09-10 DIAGNOSIS — M7989 Other specified soft tissue disorders: Secondary | ICD-10-CM | POA: Diagnosis not present

## 2019-09-15 DIAGNOSIS — G4733 Obstructive sleep apnea (adult) (pediatric): Secondary | ICD-10-CM | POA: Diagnosis not present

## 2019-09-16 ENCOUNTER — Other Ambulatory Visit: Payer: Self-pay | Admitting: Gastroenterology

## 2019-09-16 ENCOUNTER — Telehealth: Payer: Self-pay | Admitting: Gastroenterology

## 2019-09-16 DIAGNOSIS — R1310 Dysphagia, unspecified: Secondary | ICD-10-CM

## 2019-09-16 MED ORDER — OMEPRAZOLE 40 MG PO CPDR: 40.0000 mg | DELAYED_RELEASE_CAPSULE | Freq: Two times a day (BID) | ORAL | 1 refills | Status: DC

## 2019-09-16 NOTE — Telephone Encounter (Signed)
Sent prescription to pharmacy to get him through to his next appointment. Patient notified

## 2019-10-08 ENCOUNTER — Other Ambulatory Visit: Payer: Self-pay | Admitting: Cardiology

## 2019-10-08 ENCOUNTER — Other Ambulatory Visit: Payer: Self-pay | Admitting: Gastroenterology

## 2019-10-08 ENCOUNTER — Other Ambulatory Visit: Payer: Self-pay | Admitting: Diagnostic Neuroimaging

## 2019-10-08 DIAGNOSIS — R1310 Dysphagia, unspecified: Secondary | ICD-10-CM

## 2019-10-08 NOTE — Telephone Encounter (Signed)
Filled by Dr. Marlou Porch last year, per testing done after office visit Dr. Marlou Porch advised that patient did not need cardiology follow up. Ok to fill or does it need to go to PCP?

## 2019-10-14 ENCOUNTER — Other Ambulatory Visit: Payer: Self-pay | Admitting: *Deleted

## 2019-10-14 ENCOUNTER — Other Ambulatory Visit: Payer: Self-pay | Admitting: Family Medicine

## 2019-10-14 MED ORDER — GABAPENTIN 300 MG PO CAPS: 300.0000 mg | ORAL_CAPSULE | Freq: Two times a day (BID) | ORAL | 0 refills | Status: DC

## 2019-10-14 NOTE — Telephone Encounter (Signed)
I spoke to pt.  He has appt 11-23-19. Will run out on Saturday.  Would like 30 day thru local pharm [facility] [address].  Then mail order prescription as well.

## 2019-10-14 NOTE — Telephone Encounter (Signed)
Pt is needing a refill on his gabapentin (NEURONTIN) 300 MG capsule sent to the [facility] on [address] even though he normally gets it through the [facility] because he will be running out by Saturday. Pt has scheduled a f/u visit as well. Please advise.

## 2019-10-26 ENCOUNTER — Ambulatory Visit (INDEPENDENT_AMBULATORY_CARE_PROVIDER_SITE_OTHER): Payer: BC Managed Care – PPO | Admitting: Gastroenterology

## 2019-10-26 ENCOUNTER — Other Ambulatory Visit: Payer: Self-pay

## 2019-10-26 ENCOUNTER — Encounter: Payer: Self-pay | Admitting: Gastroenterology

## 2019-10-26 VITALS — BP 104/60 | HR 58 | Temp 98.1°F | Ht 70.0 in | Wt 268.0 lb

## 2019-10-26 DIAGNOSIS — K219 Gastro-esophageal reflux disease without esophagitis: Secondary | ICD-10-CM | POA: Diagnosis not present

## 2019-10-26 MED ORDER — OMEPRAZOLE 40 MG PO CPDR: 40.0000 mg | DELAYED_RELEASE_CAPSULE | Freq: Every day | ORAL | 3 refills | Status: DC

## 2019-10-26 NOTE — Progress Notes (Signed)
Review of pertinent gastrointestinal problems: 1. Ulcerative reflux related esophagitis, focal peptic stricture noted on EGD 02/2017 Dr. 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  He was put on BID PPI and symptoms completely resolved. Follow-up EGD November 2018 showed focal mild stenosis at the GE junction. No evidence of Barrett's mucosa. Given complete lack of dysphagia the mild stenosis was not dilated. He had mild gastritis, biopsies were taken and showed no sign of H. Pylori. He was recommended to stay on proton pump inhibitor once daily.   HPI: This is a very pleasant 58 year old Timothy [name] whom I last saw about a year ago the time of an EGD.  He has been taking his omeprazole just 1 pill once daily and on that regimen he feels very well.  He has had one episode of pyrosis in the past year.  He has had no episodes of dysphagia.  His weight is up in the past month by about 20 pounds.  Chief complaint is GERD without alarm symptoms  ROS: complete GI ROS as described in HPI, all other review negative.  Constitutional:  No unintentional weight loss   Past Medical History:  Diagnosis Date  . Allergy   . Asthma   . Barrett's esophagus   . BPH (benign prostatic hyperplasia)   . Fibromyalgia   . GERD (gastroesophageal reflux disease)   . History of hiatal hernia   . History of kidney stones   . Hyperlipidemia   . Hypertension   . Kidney stones   . Peripheral neuropathy   . Sleep apnea    CPAP 100%  . Small fiber neuropathy     Past Surgical History:  Procedure Laterality Date  . arm surgery Right 2005  . KNEE SURGERY Bilateral 1976  . LIPOMA EXCISION N/A 11/06/2016   Procedure: EXCISION LIPOMA ABDOMINAL WALL;  Surgeon: Clovis Riley, MD;  Location: [facility];  Service: General;  Laterality: N/A;  . NASAL SINUS SURGERY  2001  . 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 aspirin EC Timothy MG tablet Take Timothy mg by mouth daily.    . bisoprolol-hydrochlorothiazide (ZIAC) 5-6.25 MG tablet Take 1 tablet by mouth daily. 90 tablet 3  . cetirizine (ZYRTEC) 10 MG tablet Take 10 mg by mouth daily as needed for allergies.     Marland Kitchen doxazosin (CARDURA XL) 4 MG 24 hr tablet Take 1 tablet (4 mg total) by mouth daily with breakfast. 90 tablet 3  . DULoxetine (CYMBALTA) 30 MG capsule Take 1 capsule (30 mg total) by mouth daily. 30 capsule 5  . gabapentin (NEURONTIN) 300 MG capsule Take 1 capsule (300 mg total) by mouth 2 (two) times daily. 60 capsule 0  . Multiple Vitamin (MULTIVITAMIN) tablet Take 1 tablet by mouth daily.    . Naproxen Sodium (ALEVE PO) Take 1-2 tablets by mouth 2 (two) times daily as needed. As needed     . omeprazole (PRILOSEC) 40 MG capsule Take 40 mg by mouth daily.    . pravastatin (PRAVACHOL) 80 MG tablet Take 80 mg by mouth daily.     No current facility-administered medications for this visit.     Allergies as of 10/26/2019  . (No Known Allergies)    Family History  Problem Relation Age of Onset  . Heart disease Father   . Diabetes Father   . Skin cancer Father   .  Heart disease Paternal Grandmother   . Valvular heart disease Brother   . Chiari malformation Daughter   . Other Daughter        Noonan syndrome  . Heart disease Paternal Grandfather   . Heart disease Mother   . Colon cancer Neg Hx   . Stomach cancer Neg Hx   . Rectal cancer Neg Hx   . Esophageal cancer Neg Hx     Social History   Socioeconomic History  . Marital status: Married    Spouse name: Angelita Ingles  . Number of children: 2  . Years of education: 59  . Highest education level: Not on file  Occupational History  . Occupation: Actuary    Comment: [employer]  Social Needs  . Financial resource strain: Not on file  . Food insecurity    Worry: Not on file    Inability: Not on file  . Transportation needs     Medical: Not on file    Non-medical: Not on file  Tobacco Use  . Smoking status: Never Smoker  . Smokeless tobacco: Never Used  Substance and Sexual Activity  . Alcohol use: Yes    Alcohol/week: 0.0 standard drinks    Comment: 2 per month  . Drug use: No  . Sexual activity: Not on file  Lifestyle  . Physical activity    Days per week: Not on file    Minutes per session: Not on file  . Stress: Not on file  Relationships  . Social Herbalist on phone: Not on file    Gets together: Not on file    Attends religious service: Not on file    Active member of club or organization: Not on file    Attends meetings of clubs or organizations: Not on file    Relationship status: Not on file  . Intimate partner violence    Fear of current or ex partner: Not on file    Emotionally abused: Not on file    Physically abused: Not on file    Forced sexual activity: Not on file  Other Topics Concern  . Not on file  Social History Narrative   Lives with wife   Caffeine use- coffee 2 cups daily     Physical Exam: BP 104/60 (BP Location: Left Arm, Patient Position: Sitting, Cuff Size: Large)   Pulse (!) 58   Temp 98.1 F (36.7 C)   Ht 5\' 10"  (1.778 m)   Wt 268 lb (121.6 kg)   SpO2 97%   BMI 38.45 kg/m  Constitutional: generally well-appearing Psychiatric: alert and oriented x3 Abdomen: soft, nontender, nondistended, no obvious ascites, no peritoneal signs, normal bowel sounds No peripheral edema noted in lower extremities  Assessment and plan: 58 y.o. male with GERD without alarm symptoms  I am happy to refill his omeprazole, 40 mg 1 pill once daily.  I will be happy to refill this again in 1 year from now without the need for office visit.  However if he still needs prescription medicines to control his GERD in 2 years I would like to see him in the office at that time.  Please see the "Patient Instructions" section for addition details about the plan.  Owens Loffler, MD [facility] 10/26/2019, 3:48 PM

## 2019-10-26 NOTE — Patient Instructions (Signed)
If you are age 59 or older, your body mass index should be between 23-30. Your Body mass index is 38.45 kg/m. If this is out of the aforementioned range listed, please consider follow up with your Primary Care Provider.  If you are age 22 or younger, your body mass index should be between 19-25. Your Body mass index is 38.45 kg/m. If this is out of the aformentioned range listed, please consider follow up with your Primary Care Provider.   We have sent the following medications to your pharmacy for you to pick up at your convenience:  Omeprazole 40 mg daily.

## 2019-10-29 DIAGNOSIS — R3912 Poor urinary stream: Secondary | ICD-10-CM | POA: Diagnosis not present

## 2019-10-29 DIAGNOSIS — N401 Enlarged prostate with lower urinary tract symptoms: Secondary | ICD-10-CM | POA: Diagnosis not present

## 2019-11-09 DIAGNOSIS — R3912 Poor urinary stream: Secondary | ICD-10-CM | POA: Diagnosis not present

## 2019-11-09 DIAGNOSIS — N200 Calculus of kidney: Secondary | ICD-10-CM | POA: Diagnosis not present

## 2019-11-11 ENCOUNTER — Telehealth: Payer: Self-pay

## 2019-11-11 MED ORDER — GABAPENTIN 300 MG PO CAPS: 300.0000 mg | ORAL_CAPSULE | Freq: Two times a day (BID) | ORAL | 0 refills | Status: DC

## 2019-11-15 ENCOUNTER — Other Ambulatory Visit: Payer: Self-pay | Admitting: *Deleted

## 2019-11-15 ENCOUNTER — Ambulatory Visit: Payer: BC Managed Care – PPO | Attending: Internal Medicine

## 2019-11-15 DIAGNOSIS — R238 Other skin changes: Secondary | ICD-10-CM

## 2019-11-15 DIAGNOSIS — U071 COVID-19: Secondary | ICD-10-CM | POA: Diagnosis not present

## 2019-11-15 DIAGNOSIS — Z20828 Contact with and (suspected) exposure to other viral communicable diseases: Secondary | ICD-10-CM | POA: Diagnosis not present

## 2019-11-15 MED ORDER — GABAPENTIN 300 MG PO CAPS: 300.0000 mg | ORAL_CAPSULE | Freq: Two times a day (BID) | ORAL | 0 refills | Status: DC

## 2019-11-15 NOTE — Telephone Encounter (Signed)
Pt calling for prescription to local pharmacy as his mail order is delayed.  Did a 10day refill to [facility] in [place].

## 2019-11-15 NOTE — Telephone Encounter (Signed)
Renewed for 10 days at [facility] per pt request.

## 2019-11-15 NOTE — Telephone Encounter (Signed)
Pt called in and stated that his refill of gabapentin (NEURONTIN) 300 MG capsule is stuck in transit with [employer] and wants to know if he can get a small supply to lat him until it arrives  Ivalee Moncure, [address] AT [place]

## 2019-11-16 LAB — NOVEL CORONAVIRUS, NAA: SARS-CoV-2, NAA: NOT DETECTED

## 2019-11-22 NOTE — Progress Notes (Signed)
PATIENT: Timothy [name] DOB: 24-Jan-1961  REASON FOR VISIT: follow up HISTORY FROM: patient  Chief Complaint  Patient presents with  . Follow-up    RM5, alone. States that his gabapentin wasd lost in transit.      HISTORY OF PRESENT ILLNESS: Today 11/23/19 Timothy [name] is a 58 y.o. male here today for follow up for neuropathy. He continues gabapentin 600mg  at night and duloxetine 30mg  daily. He feels that lower extremity numbness and tingling is progressing. Worse on left. Mostly involves bid toe and ball of foot. He does feel that symptoms improved initially with duloxetine. Over the past two months symptoms have worsened. He has also noted left thumb numbness, specifically when he wakes in the mornings. 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 He has a history of prediabetes. Last A1C was 6.   HISTORY: (copied from my note on 03/30/2019)  [name] is a 58 y.o. male for follow up of neuropathy.  [name] reports that neuropathy symptoms have steadily increased over the past year.  He reports both numbness and tingling in bilateral lower extremities.  At his last visit nearly a year ago he was advised to increase gabapentin to 600 mg at night.  He reports this did help for short period of time.  He can definitely tell if he misses his dose of gabapentin.  He continues to be active.  He has not correlated any worsening or improvement with activity.  He feels that neuropathy is constant and not worse at any particular time.  He is under more stress currently as his daughter has been hospitalized.  He travels frequently and spends a lot of time out of town.  He is followed closely by his PCP who has been monitoring lab work.  He reports labs have been normal.   HISTORY (copied from [employer] note on 09/18/2018)  UPDATE10/25/2019CMMr. Menna, 58 year old male returns for follow-up with history of small fiber neuropathy in both  lower extremities. He is currently on gabapentin. This was started about 1 year ago he says his symptoms have gone away on the right side but he still feels numbness and tingling in his left leg and foot he is only taking gabapentin 300 at night the morning dose makes him too drowsy. He also has a history of obesity obstructive sleep apnea using CPAP. He also has an area on his left knee at the back which he says swells at times. He returns for reevaluation   UPDATE 4/26/2019CMMr. Rosado,, 58 year old male returns for follow-up with history of neuropathy in both feet worse over the last 6 months. He continues to have numbness feelings in the left arm leg and foot which are fairly constant no change in the right side. He was just started on gabapentin 6 months ago. He is morbidly obese and have obstructive sleep apnea using CPAP. His CPAP is followed by primary care. He continues to work full-time and travels for his job. He returns for reevaluation   UPDATE 10/25/2018CMMr. Lepre, 58 year old male returns for follow-up for small fiber neuropathy.he has begun having daily symptoms of neuropathy in the left foot and lack that has been progressively getting worse over the last year.He has never been on any medication for this.He feels he cannot be as active as previously due to burning pain in the feet. When his activity decreased his weight increased. He was recently diagnosed with hiatal hernia and Barrett's esophagitis.He has a  history of obstructive sleep apnea and uses CPAP.He returns for reevaluation   04/12/16 VP61 year old right-handed male with hypertension, hypercholesteremia, sleep apnea, kidney stones, here for evaluation of back pain and numbness in the feet. Symptoms present for at least 6 months.  For past 6 months has had midline low back pain. Symptoms worse when he sleeps for a long time. Worsened early morning. Patient improved after activity. No radiating symptoms. No  specific triggering factors. No history of severe accident or trauma.  Around the same time patient has also noted numbness and tingling in the balls of the feet, left worse than right side. Symptoms seem to be independent from back pain problems. Patient has had lab screening testing with B12, TSH, A1c which were unremarkable.   REVIEW OF SYSTEMS: Out of a complete 14 system review of symptoms, the patient complains only of the following symptoms, numbness and tingling and all other reviewed systems are negative.  ALLERGIES: No Known Allergies  HOME MEDICATIONS: Outpatient Medications Prior to Visit  Medication Sig Dispense Refill  . aspirin EC 81 MG tablet Take 81 mg by mouth daily.    . bisoprolol-hydrochlorothiazide (ZIAC) 5-6.25 MG tablet Take 1 tablet by mouth daily. 90 tablet 3  . cetirizine (ZYRTEC) 10 MG tablet Take 10 mg by mouth daily as needed for allergies.     Marland Kitchen doxazosin (CARDURA XL) 4 MG 24 hr tablet Take 1 tablet (4 mg total) by mouth daily with breakfast. 90 tablet 3  . Multiple Vitamin (MULTIVITAMIN) tablet Take 1 tablet by mouth daily.    . Naproxen Sodium (ALEVE PO) Take 1-2 tablets by mouth 2 (two) times daily as needed. As needed     . omeprazole (PRILOSEC) 40 MG capsule Take 1 capsule (40 mg total) by mouth daily. 90 capsule 3  . pravastatin (PRAVACHOL) 80 MG tablet Take 80 mg by mouth daily.    . DULoxetine (CYMBALTA) 30 MG capsule Take 1 capsule (30 mg total) by mouth daily. 30 capsule 5  . gabapentin (NEURONTIN) 300 MG capsule Take 1 capsule (300 mg total) by mouth 2 (two) times daily. 20 capsule 0   No facility-administered medications prior to visit.    PAST MEDICAL HISTORY: Past Medical History:  Diagnosis Date  . Allergy   . Asthma   . Barrett's esophagus   . BPH (benign prostatic hyperplasia)   . Fibromyalgia   . GERD (gastroesophageal reflux disease)   . History of hiatal hernia   . History of kidney stones   . Hyperlipidemia   . Hypertension     . Kidney stones   . Peripheral neuropathy   . Sleep apnea    CPAP 100%  . Small fiber neuropathy     PAST SURGICAL HISTORY: Past Surgical History:  Procedure Laterality Date  . arm surgery Right 2005  . KNEE SURGERY Bilateral 1976  . LIPOMA EXCISION N/A 11/06/2016   Procedure: EXCISION LIPOMA ABDOMINAL WALL;  Surgeon: Clovis Riley, MD;  Location: [facility];  Service: General;  Laterality: N/A;  . NASAL SINUS SURGERY  2001  . 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 Heart disease Father   . Diabetes Father   . Skin cancer Father   . Heart disease Paternal Grandmother   . Valvular heart disease Brother   . Chiari malformation Daughter   . Other Daughter        Noonan  syndrome  . Heart disease Paternal Grandfather   . Heart disease Mother   . Colon cancer Neg Hx   . Stomach cancer Neg Hx   . Rectal cancer Neg Hx   . Esophageal cancer Neg Hx     SOCIAL HISTORY: Social History   Socioeconomic History  . Marital status: Married    Spouse name: Angelita Ingles  . Number of children: 2  . Years of education: 39  . Highest education level: Not on file  Occupational History  . Occupation: Actuary    Comment: [employer]  Tobacco Use  . Smoking status: Never Smoker  . Smokeless tobacco: Never Used  Substance and Sexual Activity  . Alcohol use: Yes    Alcohol/week: 0.0 standard drinks    Comment: 2 per month  . Drug use: No  . Sexual activity: Not on file  Other Topics Concern  . Not on file  Social History Narrative   Lives with wife   Caffeine use- coffee 2 cups daily   Social Determinants of Health   Financial Resource Strain:   . Difficulty of Paying Living Expenses: Not on file  Food Insecurity:   . Worried About Charity fundraiser in the Last Year: Not on file  . Ran Out of Food in the Last Year: Not on file  Transportation Needs:   . Lack of Transportation  (Medical): Not on file  . Lack of Transportation (Non-Medical): Not on file  Physical Activity:   . Days of Exercise per Week: Not on file  . Minutes of Exercise per Session: Not on file  Stress:   . Feeling of Stress : Not on file  Social Connections:   . Frequency of Communication with Friends and Family: Not on file  . Frequency of Social Gatherings with Friends and Family: Not on file  . Attends Religious Services: Not on file  . Active Member of Clubs or Organizations: Not on file  . Attends Archivist Meetings: Not on file  . Marital Status: Not on file  Intimate Partner Violence:   . Fear of Current or Ex-Partner: Not on file  . Emotionally Abused: Not on file  . Physically Abused: Not on file  . Sexually Abused: Not on file      PHYSICAL EXAM  Vitals:   11/23/19 1420  BP: 127/73  Pulse: (!) 59  Temp: 97.6 F (36.4 C)  Weight: 262 lb (118.8 kg)  Height: 5\' 10"  (1.778 m)   Body mass index is 37.59 kg/m.  Generalized: Well developed, in no acute distress  Cardiology: normal rate and rhythm, no murmur noted Respiratory: Clear to auscultation bilaterally Neurological examination  Mentation: Alert oriented to time, place, history taking. Follows all commands speech and language fluent Cranial nerve II-XII: Pupils were equal round reactive to light. Extraocular movements were full, visual field were full Motor: The motor testing reveals 5 over 5 strength of all 4 extremities. Good symmetric motor tone is noted throughout.  Sensory: Sensory testing is intact to soft touch on all 4 extremities. No evidence of extinction is noted. Pinprick testing intact bilaterally. Positive left sided Tinel and Phalen sign  Coordination: Cerebellar testing reveals good finger-nose-finger and heel-to-shin bilaterally.  Gait and station: Gait is normal.    DIAGNOSTIC DATA (LABS, IMAGING, TESTING) - I reviewed patient records, labs, notes, testing and imaging myself where  available.  No flowsheet data found.   Lab Results  Component Value Date   WBC 7.4  02/21/2017   HGB 13.2 02/21/2017   HCT 40.6 02/21/2017   MCV 80.7 02/21/2017   PLT 263 02/21/2017      Component Value Date/Time   NA 142 11/09/2018 1013   K 4.0 11/09/2018 1013   CL 105 11/09/2018 1013   CO2 23 11/09/2018 1013   GLUCOSE 88 11/09/2018 1013   GLUCOSE 134 (H) 02/21/2017 2335   BUN 13 11/09/2018 1013   CREATININE 1.02 11/09/2018 1013   CALCIUM 9.3 11/09/2018 1013   GFRNONAA 81 11/09/2018 1013   GFRAA 93 11/09/2018 1013   No results found for: CHOL, HDL, LDLCALC, LDLDIRECT, TRIG, CHOLHDL No results found for: HGBA1C No results found for: VITAMINB12 No results found for: TSH     ASSESSMENT AND PLAN 58 y.o. year old male  has a past medical history of Allergy, Asthma, Barrett's esophagus, BPH (benign prostatic hyperplasia), Fibromyalgia, GERD (gastroesophageal reflux disease), History of hiatal hernia, History of kidney stones, Hyperlipidemia, Hypertension, Kidney stones, Peripheral neuropathy, Sleep apnea, and Small fiber neuropathy. here with     ICD-10-CM   1. Other polyneuropathy  G62.89 DULoxetine (CYMBALTA) 60 MG capsule  2. Numbness of left thumb  R20.0     Mr. Prechtel has noted progression of neuropathy pain, worse of the left great toe and ball of foot.  He did note improvement in symptoms after starting duloxetine 30 mg.  We will increase this dose to 60 mg daily.  He will continue gabapentin 600 mg at night.  He is also having numbness of left thumb.  Positive Tinel and Phalen's in the office today.  Most likely carpal tunnel.  I have advised that he wear a wrist brace for neutralization.  We have also discussed alpha lipoic acid over-the-counter for neuropathy pain.  He will continue to work closely with primary care to lower A1c.  Healthy well-balanced, low glycemic diet and regular exercise advised.  He will follow-up with me in 6 months, sooner if needed.  He verbalizes  understanding and agreement with this plan.   No orders of the defined types were placed in this encounter.    Meds ordered this encounter  Medications  . DULoxetine (CYMBALTA) 60 MG capsule    Sig: Take 1 capsule (60 mg total) by mouth daily.    Dispense:  90 capsule    Refill:  3    Order Specific Question:   Supervising Provider    Answer:   Melvenia Beam [id number]  . gabapentin (NEURONTIN) 300 MG capsule    Sig: Take 2 capsules (600 mg total) by mouth at bedtime.    Dispense:  20 capsule    Refill:  0    Pt is waiting for mail order and is delayed.    Order Specific Question:   Supervising Provider    Answer:   Melvenia Beam [id number], FNP-C 11/23/2019, 2:59 PM [facility] [address], [place], [place] [address] [phone]

## 2019-11-23 ENCOUNTER — Other Ambulatory Visit: Payer: Self-pay

## 2019-11-23 ENCOUNTER — Ambulatory Visit (INDEPENDENT_AMBULATORY_CARE_PROVIDER_SITE_OTHER): Payer: BC Managed Care – PPO | Admitting: Family Medicine

## 2019-11-23 ENCOUNTER — Encounter

## 2019-11-23 ENCOUNTER — Encounter: Payer: Self-pay | Admitting: Family Medicine

## 2019-11-23 VITALS — BP 127/73 | HR 59 | Temp 97.6°F | Ht 70.0 in | Wt 262.0 lb

## 2019-11-23 DIAGNOSIS — I10 Essential (primary) hypertension: Secondary | ICD-10-CM | POA: Diagnosis not present

## 2019-11-23 DIAGNOSIS — R7303 Prediabetes: Secondary | ICD-10-CM | POA: Diagnosis not present

## 2019-11-23 DIAGNOSIS — R200 Anesthesia of skin: Secondary | ICD-10-CM

## 2019-11-23 DIAGNOSIS — G629 Polyneuropathy, unspecified: Secondary | ICD-10-CM | POA: Diagnosis not present

## 2019-11-23 DIAGNOSIS — E785 Hyperlipidemia, unspecified: Secondary | ICD-10-CM | POA: Diagnosis not present

## 2019-11-23 DIAGNOSIS — R5383 Other fatigue: Secondary | ICD-10-CM | POA: Diagnosis not present

## 2019-11-23 DIAGNOSIS — Z0000 Encounter for general adult medical examination without abnormal findings: Secondary | ICD-10-CM | POA: Diagnosis not present

## 2019-11-23 DIAGNOSIS — G6289 Other specified polyneuropathies: Secondary | ICD-10-CM | POA: Diagnosis not present

## 2019-11-23 MED ORDER — GABAPENTIN 300 MG PO CAPS: 600.0000 mg | ORAL_CAPSULE | Freq: Every day | ORAL | 0 refills | Status: DC

## 2019-11-23 MED ORDER — DULOXETINE HCL 60 MG PO CPEP: 60.0000 mg | ORAL_CAPSULE | Freq: Every day | ORAL | 3 refills | Status: DC

## 2019-11-23 NOTE — Patient Instructions (Signed)
We will increase duloxetine to 60mg  daily. Continue gabapentin 600mg  at night. Consider alpha lipoic acid. You can get this from pharmacy.   Carpal tunnel brace. Follow up with PCP if symptoms worsen.   Follow up with in 6 months, sooner if needed   Peripheral Neuropathy Peripheral neuropathy is a type of nerve damage. It affects nerves that carry signals between the spinal cord and the arms, legs, and the rest of the body (peripheral nerves). It does not affect nerves in the spinal cord or brain. In peripheral neuropathy, one nerve or a group of nerves may be damaged. Peripheral neuropathy is a broad category that includes many specific nerve disorders, like diabetic neuropathy, hereditary neuropathy, and carpal tunnel syndrome. What are the causes? This condition may be caused by:  Diabetes. This is the most common cause of peripheral neuropathy.  Nerve injury.  Pressure or stress on a nerve that lasts a long time.  Lack (deficiency) of B vitamins. This can result from alcoholism, poor diet, or a restricted diet.  Infections.  Autoimmune diseases, such as rheumatoid arthritis and systemic lupus erythematosus.  Nerve diseases that are passed from parent to child (inherited).  Some medicines, such as cancer medicines (chemotherapy).  Poisonous (toxic) substances, such as lead and mercury.  Too little blood flowing to the legs.  Kidney disease.  Thyroid disease. In some cases, the cause of this condition is not known. What are the signs or symptoms? Symptoms of this condition depend on which of your nerves is damaged. Common symptoms include:  Loss of feeling (numbness) in the feet, hands, or both.  Tingling in the feet, hands, or both.  Burning pain.  Very sensitive skin.  Weakness.  Not being able to move a part of the body (paralysis).  Muscle twitching.  Clumsiness or poor coordination.  Loss of balance.  Not being able to control your bladder.  Feeling  dizzy.  Sexual problems. How is this diagnosed? Diagnosing and finding the cause of peripheral neuropathy can be difficult. Your health care provider will take your medical history and do a physical exam. A neurological exam will also be done. This involves checking things that are affected by your brain, spinal cord, and nerves (nervous system). For example, your health care provider will check your reflexes, how you move, and what you can feel. You may have other tests, such as:  Blood tests.  Electromyogram (EMG) and nerve conduction tests. These tests check nerve function and how well the nerves are controlling the muscles.  Imaging tests, such as CT scans or MRI to rule out other causes of your symptoms.  Removing a small piece of nerve to be examined in a lab (nerve biopsy). This is rare.  Removing and examining a small amount of the fluid that surrounds the brain and spinal cord (lumbar puncture). This is rare. How is this treated? Treatment for this condition may involve:  Treating the underlying cause of the neuropathy, such as diabetes, kidney disease, or vitamin deficiencies.  Stopping medicines that can cause neuropathy, such as chemotherapy.  Medicine to relieve pain. Medicines may include: ? Prescription or over-the-counter pain medicine. ? Antiseizure medicine. ? Antidepressants. ? Pain-relieving patches that are applied to painful areas of skin.  Surgery to relieve pressure on a nerve or to destroy a nerve that is causing pain.  Physical therapy to help improve movement and balance.  Devices to help you move around (assistive devices). Follow these instructions at home: Medicines  Take over-the-counter and  prescription medicines only as told by your health care provider. Do not take any other medicines without first asking your health care provider.  Do not drive or use heavy machinery while taking prescription pain medicine. Lifestyle   Do not use any  products that contain nicotine or tobacco, such as cigarettes and e-cigarettes. Smoking keeps blood from reaching damaged nerves. If you need help quitting, ask your health care provider.  Avoid or limit alcohol. Too much alcohol can cause a vitamin B deficiency, and vitamin B is needed for healthy nerves.  Eat a healthy diet. This includes: ? Eating foods that are high in fiber, such as fresh fruits and vegetables, whole grains, and beans. ? Limiting foods that are high in fat and processed sugars, such as fried or sweet foods. General instructions   If you have diabetes, work closely with your health care provider to keep your blood sugar under control.  If you have numbness in your feet: ? Check every day for signs of injury or infection. Watch for redness, warmth, and swelling. ? Wear padded socks and comfortable shoes. These help protect your feet.  Develop a good support system. Living with peripheral neuropathy can be stressful. Consider talking with a mental health specialist or joining a support group.  Use assistive devices and attend physical therapy as told by your health care provider. This may include using a walker or a cane.  Keep all follow-up visits as told by your health care provider. This is important. Contact a health care provider if:  You have new signs or symptoms of peripheral neuropathy.  You are struggling emotionally from dealing with peripheral neuropathy.  Your pain is not well-controlled. Get help right away if:  You have an injury or infection that is not healing normally.  You develop new weakness in an arm or leg.  You fall frequently. Summary  Peripheral neuropathy is when the nerves in the arms, or legs are damaged, resulting in numbness, weakness, or pain.  There are many causes of peripheral neuropathy, including diabetes, pinched nerves, vitamin deficiencies, autoimmune disease, and hereditary conditions.  Diagnosing and finding the  cause of peripheral neuropathy can be difficult. Your health care provider will take your medical history, do a physical exam, and do tests, including blood tests and nerve function tests.  Treatment involves treating the underlying cause of the neuropathy and taking medicines to help control pain. Physical therapy and assistive devices may also help. This information is not intended to replace advice given to you by your health care provider. Make sure you discuss any questions you have with your health care provider. Document Released: 11/01/2002 Document Revised: 10/24/2017 Document Reviewed: 01/20/2017 Elsevier Patient Education  2020 [place] tunnel syndrome is a condition that causes pain in your hand and arm. The carpal tunnel is a narrow area that is on the palm side of your wrist. Repeated wrist motion or certain diseases may cause swelling in the tunnel. This swelling can pinch the main nerve in the wrist (median nerve). What are the causes? This condition may be caused by:  Repeated wrist motions.  Wrist injuries.  Arthritis.  A sac of fluid (cyst) or abnormal growth (tumor) in the carpal tunnel.  Fluid buildup during pregnancy. Sometimes the cause is not known. What increases the risk? The following factors may make you more likely to develop this condition:  Having a job in which you move your wrist in the same  way many times. This includes jobs like being a Software engineer or a Scientist, water quality.  Being a woman.  Having other health conditions, such as: ? Diabetes. ? Obesity. ? A thyroid gland that is not active enough (hypothyroidism). ? Kidney failure. What are the signs or symptoms? Symptoms of this condition include:  A tingling feeling in your fingers.  Tingling or a loss of feeling (numbness) in your hand.  Pain in your entire arm. This pain may get worse when you bend your wrist and elbow for a long time.  Pain in your wrist that  goes up your arm to your shoulder.  Pain that goes down into your palm or fingers.  A weak feeling in your hands. You may find it hard to grab and hold items. You may feel worse at night. How is this diagnosed? This condition is diagnosed with a medical history and physical exam. You may also have tests, such as:  Electromyogram (EMG). This test checks the signals that the nerves send to the muscles.  Nerve conduction study. This test checks how well signals pass through your nerves.  Imaging tests, such as X-rays, ultrasound, and MRI. These tests check for what might be the cause of your condition. How is this treated? This condition may be treated with:  Lifestyle changes. You will be asked to stop or change the activity that caused your problem.  Doing exercise and activities that make bones and muscles stronger (physical therapy).  Learning how to use your hand again (occupational therapy).  Medicines for pain and swelling (inflammation). You may have injections in your wrist.  A wrist splint.  Surgery. Follow these instructions at home: If you have a splint:  Wear the splint as told by your doctor. Remove it only as told by your doctor.  Loosen the splint if your fingers: ? Tingle. ? Lose feeling (become numb). ? Turn cold and blue.  Keep the splint clean.  If the splint is not waterproof: ? Do not let it get wet. ? Cover it with a watertight covering when you take a bath or a shower. Managing pain, stiffness, and swelling   If told, put ice on the painful area: ? If you have a removable splint, remove it as told by your doctor. ? Put ice in a plastic bag. ? Place a towel between your skin and the bag. ? Leave the ice on for 20 minutes, 2-3 times per day. General instructions  Take over-the-counter and prescription medicines only as told by your doctor.  Rest your wrist from any activity that may cause pain. If needed, talk with your boss at work about  changes that can help your wrist heal.  Do any exercises as told by your doctor, physical therapist, or occupational therapist.  Keep all follow-up visits as told by your doctor. This is important. Contact a doctor if:  You have new symptoms.  Medicine does not help your pain.  Your symptoms get worse. Get help right away if:  You have very bad numbness or tingling in your wrist or hand. Summary  Carpal tunnel syndrome is a condition that causes pain in your hand and arm.  It is often caused by repeated wrist motions.  Lifestyle changes and medicines are used to treat this problem. Surgery may help in very bad cases.  Follow your doctor's instructions about wearing a splint, resting your wrist, keeping follow-up visits, and calling for help. This information is not intended to replace advice given to you  by your health care provider. Make sure you discuss any questions you have with your health care provider. Document Released: 10/31/2011 Document Revised: 03/20/2018 Document Reviewed: 03/20/2018 Elsevier Patient Education  2020 [employer].

## 2019-11-24 DIAGNOSIS — R509 Fever, unspecified: Secondary | ICD-10-CM | POA: Diagnosis not present

## 2019-11-30 DIAGNOSIS — Z0000 Encounter for general adult medical examination without abnormal findings: Secondary | ICD-10-CM | POA: Diagnosis not present

## 2019-11-30 DIAGNOSIS — R7303 Prediabetes: Secondary | ICD-10-CM | POA: Diagnosis not present

## 2019-12-02 ENCOUNTER — Other Ambulatory Visit: Payer: Self-pay | Admitting: *Deleted

## 2019-12-13 DIAGNOSIS — Z23 Encounter for immunization: Secondary | ICD-10-CM | POA: Diagnosis not present

## 2019-12-14 DIAGNOSIS — G4733 Obstructive sleep apnea (adult) (pediatric): Secondary | ICD-10-CM | POA: Diagnosis not present

## 2019-12-27 ENCOUNTER — Other Ambulatory Visit: Payer: Self-pay | Admitting: Cardiology

## 2019-12-28 ENCOUNTER — Other Ambulatory Visit: Payer: Self-pay

## 2019-12-28 MED ORDER — GABAPENTIN 300 MG PO CAPS: 600.0000 mg | ORAL_CAPSULE | Freq: Every day | ORAL | 3 refills | Status: DC

## 2020-01-26 ENCOUNTER — Other Ambulatory Visit: Payer: Self-pay | Admitting: Cardiology

## 2020-02-11 DIAGNOSIS — Z23 Encounter for immunization: Secondary | ICD-10-CM | POA: Diagnosis not present

## 2020-02-18 ENCOUNTER — Other Ambulatory Visit: Payer: Self-pay | Admitting: Cardiology

## 2020-02-24 ENCOUNTER — Other Ambulatory Visit: Payer: Self-pay

## 2020-02-24 MED ORDER — BISOPROLOL-HYDROCHLOROTHIAZIDE 5-6.25 MG PO TABS: ORAL_TABLET | ORAL | 0 refills | Status: DC

## 2020-02-25 ENCOUNTER — Other Ambulatory Visit: Payer: Self-pay

## 2020-03-04 ENCOUNTER — Ambulatory Visit: Payer: Self-pay | Attending: Internal Medicine

## 2020-03-04 DIAGNOSIS — Z23 Encounter for immunization: Secondary | ICD-10-CM

## 2020-03-04 NOTE — Progress Notes (Signed)
   Covid-19 Vaccination Clinic  Name:  Timothy [name]    MRN: [id number] DOB: 12-16-1960  03/04/2020  [name] was observed post Covid-19 immunization for 15 minutes without incident. He was provided with Vaccine Information Sheet and instruction to access the V-Safe system.   [name] was instructed to call 911 with any severe reactions post vaccine: Marland Kitchen Difficulty breathing  . Swelling of face and throat  . A fast heartbeat  . A bad rash all over body  . Dizziness and weakness   Immunizations Administered    Name Date Dose VIS Date Route   Pfizer COVID-19 Vaccine 03/04/2020  8:27 AM 0.3 mL 11/05/2019 Intramuscular   Manufacturer: [place]   Lot: [id number]   [place]: [id number]

## 2020-03-27 ENCOUNTER — Ambulatory Visit: Payer: Self-pay | Attending: Internal Medicine

## 2020-03-27 DIAGNOSIS — Z23 Encounter for immunization: Secondary | ICD-10-CM

## 2020-03-27 NOTE — Progress Notes (Signed)
   Covid-19 Vaccination Clinic  Name:  Timothy [name]    MRN: [id number] DOB: 1961/03/01  03/27/2020  Mr. Hevener was observed post Covid-19 immunization for 15 minutes without incident. He was provided with Vaccine Information Sheet and instruction to access the V-Safe system.   Mr. Stallone was instructed to call 911 with any severe reactions post vaccine: Marland Kitchen Difficulty breathing  . Swelling of face and throat  . A fast heartbeat  . A bad rash all over body  . Dizziness and weakness   Immunizations Administered    Name Date Dose VIS Date Route   Pfizer COVID-19 Vaccine 03/27/2020 11:24 AM 0.3 mL 01/19/2019 Intramuscular   Manufacturer: [place]   Lot: [id number]   [place]: [id number]

## 2020-04-04 ENCOUNTER — Other Ambulatory Visit: Payer: Self-pay | Admitting: *Deleted

## 2020-04-04 MED ORDER — GABAPENTIN 300 MG PO CAPS: 600.0000 mg | ORAL_CAPSULE | Freq: Every day | ORAL | 1 refills | Status: DC

## 2020-05-22 ENCOUNTER — Other Ambulatory Visit: Payer: Self-pay

## 2020-05-22 ENCOUNTER — Encounter: Payer: Self-pay | Admitting: Family Medicine

## 2020-05-22 ENCOUNTER — Ambulatory Visit (INDEPENDENT_AMBULATORY_CARE_PROVIDER_SITE_OTHER): Payer: BC Managed Care – PPO | Admitting: Family Medicine

## 2020-05-22 ENCOUNTER — Other Ambulatory Visit: Payer: Self-pay | Admitting: Cardiology

## 2020-05-22 VITALS — BP 108/60 | HR 65 | Ht 70.0 in | Wt 271.0 lb

## 2020-05-22 DIAGNOSIS — G6289 Other specified polyneuropathies: Secondary | ICD-10-CM

## 2020-05-22 DIAGNOSIS — R200 Anesthesia of skin: Secondary | ICD-10-CM | POA: Diagnosis not present

## 2020-05-22 MED ORDER — GABAPENTIN 300 MG PO CAPS: 600.0000 mg | ORAL_CAPSULE | Freq: Every day | ORAL | 3 refills | Status: DC

## 2020-05-22 MED ORDER — DULOXETINE HCL 60 MG PO CPEP: 60.0000 mg | ORAL_CAPSULE | Freq: Every day | ORAL | 3 refills | Status: DC

## 2020-05-22 NOTE — Progress Notes (Signed)
I reviewed note and agree with plan.   Penni Bombard, MD 9/40/9828, 67:51 AM Certified in Neurology, Neurophysiology and Neuroimaging  [facility] [address], [place], [place] [address] [phone]

## 2020-05-22 NOTE — Progress Notes (Signed)
PATIENT: Timothy [name] DOB: 01/21/1961  REASON FOR VISIT: follow up HISTORY FROM: patient  Chief Complaint  Patient presents with  . Follow-up    back corner rm here for a f/u in neuropathy     HISTORY OF PRESENT ILLNESS: Today 05/22/20 Timothy [name] is a 59 y.o. male here today for follow up for neuropathy. We increased duloxetine dose to 60mg  daily in 10/2019. He does feel that this has helped. He has continued gabapentin 600mg  at bedtime. Numbness and tingling is still present. It is fairly constant but manageable. He travels for work and may be walking for 12-14 hours. These days are worse. He is no longer wearing a carpal tunnel brace as symptoms have resolved. He is followed regularly by PCP. DM is well controlled. He is feeling well today and without complaints.   HISTORY: (copied from my note on 11/23/2019)  Timothy [name] is a 58 y.o. male here today for follow up for neuropathy. He continues gabapentin 600mg  at night and duloxetine 30mg  daily. He feels that lower extremity numbness and tingling is progressing. Worse on left. Mostly involves bid toe and ball of foot. He does feel that symptoms improved initially with duloxetine. Over the past two months symptoms have worsened. He has also noted left thumb numbness, specifically when he wakes in the mornings. 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 He has a history of prediabetes. Last A1C was 6.   HISTORY: (copied from my note on 03/30/2019)  Timothy [name] a 59 y.o.malefor follow up of neuropathy.Timothy [name] reports that neuropathy symptoms have steadily increased over the past year. He reports both numbness and tingling in bilateral lower extremities. At his last visit nearly a year ago he was advised to increase gabapentin to 600 mg at night. He reports this did help for short period of time. He can definitely tell if he misses his dose of gabapentin. He continues to be  active. He has not correlated any worsening or improvement with activity. He feels that neuropathy is constant and not worse at any particular time. He is under more stress currently as his daughter has been hospitalized. He travels frequently and spends a lot of time out of town. He is followed closely by his PCP who has been monitoring lab work. He reports labs have been normal.   HISTORY(copied from [employer] note on 09/18/2018)  UPDATE10/25/2019CMMr. 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 He is currently on gabapentin. This was started about 1 year ago he says his symptoms have gone away on the right side but he still feels numbness and tingling in his left leg and foot he is only taking gabapentin 300 at night the morning dose makes him too drowsy. He also has a history of obesity obstructive sleep apnea using CPAP. He also has an area on his left knee at the back which he says swells at times. He returns for reevaluation   UPDATE 4/26/2019CMMr. [name],, 59 year old male returns for follow-up with history of neuropathy in both feet worse over the last 6 months. He continues to have numbness feelings in the left arm leg and foot which are fairly constant no change in the right side. He was just started on gabapentin 6 months ago. He is morbidly obese and have obstructive sleep apnea using CPAP. His CPAP is followed by primary care. He continues to work full-time and travels  for his job. He returns for reevaluation   UPDATE 10/25/2018CMMr. [name], 59 year old male returns for follow-up for small fiber neuropathy.he has begun having daily symptoms of neuropathy in the left foot and lack that has been progressively getting worse over the last year.He has never been on any medication for this.He feels he cannot be as active as previously due to burning pain in the feet. When his activity decreased his  weight increased. He was recently diagnosed with hiatal hernia and Barrett's esophagitis.He has a history of obstructive sleep apnea and uses CPAP.He returns for reevaluation   04/12/16 VP34 year old right-handed male with hypertension, hypercholesteremia, sleep apnea, kidney stones, here for evaluation of back pain and numbness in the feet. Symptoms present for at least 6 months.  For past 6 months has had midline low back pain. Symptoms worse when he sleeps for a long time. Worsened early morning. Patient improved after activity. No radiating symptoms. No specific triggering factors. No history of severe accident or trauma.  Around the same time patient has also noted numbness and tingling in the balls of the feet, left worse than right side. Symptoms seem to be independent from back pain problems. Patient has had lab screening testing with B12, TSH, A1c which were unremarkable.    REVIEW OF SYSTEMS: Out of a complete 14 system review of symptoms, the patient complains only of the following symptoms, numbness of feet, and all other reviewed systems are negative.  ALLERGIES: No Known Allergies  HOME MEDICATIONS: Outpatient Medications Prior to Visit  Medication Sig Dispense Refill  . aspirin EC 81 MG tablet Take 81 mg by mouth daily.    . bisoprolol-hydrochlorothiazide (ZIAC) 5-6.25 MG tablet TAKE 1 TABLET DAILY. Please make overdue appt with Dr. Marlou Porch before anymore refills. 1st attempt 30 tablet 0  . cetirizine (ZYRTEC) 10 MG tablet Take 10 mg by mouth daily as needed for allergies.     Timothy [name] Kitchen doxazosin (CARDURA XL) 4 MG 24 hr tablet Take 1 tablet (4 mg total) by mouth daily with breakfast. 90 tablet 3  . Multiple Vitamin (MULTIVITAMIN) tablet Take 1 tablet by mouth daily.    . Naproxen Sodium (ALEVE PO) Take 1-2 tablets by mouth 2 (two) times daily as needed. As needed     . omeprazole (PRILOSEC) 40 MG capsule Take 1 capsule (40 mg total) by mouth daily. 90 capsule 3  . pravastatin  (PRAVACHOL) 80 MG tablet Take 80 mg by mouth daily.    . DULoxetine (CYMBALTA) 60 MG capsule Take 1 capsule (60 mg total) by mouth daily. 90 capsule 3  . gabapentin (NEURONTIN) 300 MG capsule Take 2 capsules (600 mg total) by mouth at bedtime. 180 capsule 1   No facility-administered medications prior to visit.    PAST MEDICAL HISTORY: Past Medical History:  Diagnosis Date  . Allergy   . Asthma   . Barrett's esophagus   . BPH (benign prostatic hyperplasia)   . Fibromyalgia   . GERD (gastroesophageal reflux disease)   . History of hiatal hernia   . History of kidney stones   . Hyperlipidemia   . Hypertension   . Kidney stones   . Peripheral neuropathy   . Sleep apnea    CPAP 100%  . Small fiber neuropathy     PAST SURGICAL HISTORY: Past Surgical History:  Procedure Laterality Date  . arm surgery Right 2005  . KNEE SURGERY Bilateral 1976  . LIPOMA EXCISION N/A 11/06/2016   Procedure: EXCISION LIPOMA ABDOMINAL WALL;  Surgeon: Vikki Ports  Rich Brave, MD;  Location: [facility];  Service: General;  Laterality: N/A;  . NASAL SINUS SURGERY  2001  . 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 Heart disease Father   . Diabetes Father   . Skin cancer Father   . Heart disease Paternal Grandmother   . Valvular heart disease Brother   . Chiari malformation Daughter   . Other Daughter        Noonan syndrome  . Heart disease Paternal Grandfather   . Heart disease Mother   . Colon cancer Neg Hx   . Stomach cancer Neg Hx   . Rectal cancer Neg Hx   . Esophageal cancer Neg Hx     SOCIAL HISTORY: Social History   Socioeconomic History  . Marital status: Married    Spouse name: Angelita Ingles  . Number of children: 2  . Years of education: 29  . Highest education level: Not on file  Occupational History  . Occupation: Actuary    Comment: [employer]  Tobacco Use  . Smoking status: Never Smoker    . Smokeless tobacco: Never Used  Vaping Use  . Vaping Use: Never used  Substance and Sexual Activity  . Alcohol use: Yes    Alcohol/week: 0.0 standard drinks    Comment: 2 per month  . Drug use: No  . Sexual activity: Not on file  Other Topics Concern  . Not on file  Social History Narrative   Lives with wife   Caffeine use- coffee 2 cups daily   Social Determinants of Health   Financial Resource Strain:   . Difficulty of Paying Living Expenses:   Food Insecurity:   . Worried About Charity fundraiser in the Last Year:   . Arboriculturist in the Last Year:   Transportation Needs:   . Film/video editor (Medical):   Timothy [name] Kitchen Lack of Transportation (Non-Medical):   Physical Activity:   . Days of Exercise per Week:   . Minutes of Exercise per Session:   Stress:   . Feeling of Stress :   Social Connections:   . Frequency of Communication with Friends and Family:   . Frequency of Social Gatherings with Friends and Family:   . Attends Religious Services:   . Active Member of Clubs or Organizations:   . Attends Archivist Meetings:   Timothy [name] Kitchen Marital Status:   Intimate Partner Violence:   . Fear of Current or Ex-Partner:   . Emotionally Abused:   Timothy [name] Kitchen Physically Abused:   . Sexually Abused:       PHYSICAL EXAM  Vitals:   05/22/20 0837  BP: 108/60  Pulse: 65  Weight: 271 lb (122.9 kg)  Height: 5\' 10"  (1.778 m)   Body mass index is 38.88 kg/m.  Generalized: Well developed, in no acute distress  Cardiology: normal rate and rhythm, no murmur noted Respiratory: clear to auscultation bilaterally  Neurological examination  Mentation: Alert oriented to time, place, history taking. Follows all commands speech and language fluent Cranial nerve II-XII: Pupils were equal round reactive to light. Extraocular movements were full, visual field were full  Motor: The motor testing reveals 5 over 5 strength of all 4 extremities. Good symmetric motor tone is noted throughout.   Sensory: Sensory testing is intact to soft touch on all 4 extremities. No evidence of extinction is noted.   Gait  and station: Gait is normal.   DIAGNOSTIC DATA (LABS, IMAGING, TESTING) - I reviewed patient records, labs, notes, testing and imaging myself where available.  No flowsheet data found.   Lab Results  Component Value Date   WBC 7.4 02/21/2017   HGB 13.2 02/21/2017   HCT 40.6 02/21/2017   MCV 80.7 02/21/2017   PLT 263 02/21/2017      Component Value Date/Time   NA 142 11/09/2018 1013   K 4.0 11/09/2018 1013   CL 105 11/09/2018 1013   CO2 23 11/09/2018 1013   GLUCOSE 88 11/09/2018 1013   GLUCOSE 134 (H) 02/21/2017 2335   BUN 13 11/09/2018 1013   CREATININE 1.02 11/09/2018 1013   CALCIUM 9.3 11/09/2018 1013   GFRNONAA 81 11/09/2018 1013   GFRAA 93 11/09/2018 1013   No results found for: CHOL, HDL, LDLCALC, LDLDIRECT, TRIG, CHOLHDL No results found for: HGBA1C No results found for: VITAMINB12 No results found for: TSH     ASSESSMENT AND PLAN 59 y.o. year old male  has a past medical history of Allergy, Asthma, Barrett's esophagus, BPH (benign prostatic hyperplasia), Fibromyalgia, GERD (gastroesophageal reflux disease), History of hiatal hernia, History of kidney stones, Hyperlipidemia, Hypertension, Kidney stones, Peripheral neuropathy, Sleep apnea, and Small fiber neuropathy. here with     ICD-10-CM   1. Numbness of left thumb  R20.0   2. Other polyneuropathy  G62.89 DULoxetine (CYMBALTA) 60 MG capsule    Koree is doing well today.  We will continue duloxetine 60 mg and gabapentin 600 mg daily.  He was advised that he may use alpha lipoic acid over-the-counter for additional neuropathy support if beneficial.  Carpal tunnel symptoms have resolved.  He may return to using wrist splint if needed.  We have also discussed trying Voltaren over-the-counter.  He was advised to avoid regular use of NSAIDs.  He will continue close follow-up with primary care for  management of comorbidities.  Regular exercise and well-balanced diet advised.  He will follow-up with me in 1 year, sooner if needed.  He verbalizes understanding and agreement with this plan.  No orders of the defined types were placed in this encounter.    Meds ordered this encounter  Medications  . DULoxetine (CYMBALTA) 60 MG capsule    Sig: Take 1 capsule (60 mg total) by mouth daily.    Dispense:  90 capsule    Refill:  3    Order Specific Question:   Supervising Provider    Answer:   Melvenia Beam [id number]  . gabapentin (NEURONTIN) 300 MG capsule    Sig: Take 2 capsules (600 mg total) by mouth at bedtime.    Dispense:  180 capsule    Refill:  3    Order Specific Question:   Supervising Provider    Answer:   Melvenia Beam [id number]      I spent 15 minutes with the patient. 50% of this time was spent counseling and educating patient on plan of care and medications.    Debbora Presto, FNP-C 05/22/2020, 9:19 AM [facility] [address], [place], [place] [address] [phone]

## 2020-05-22 NOTE — Patient Instructions (Signed)
We will continue duloxetine 60mg  daily and gabapentin 600mg  at bedtime. You may consider alpha lipoic acid OTC as directed by packaging. Voltaren may be used as needed for carpal tunnel symptoms if needed. Avoid regular use of NSAIDs.   Follow up in 1 year, sooner if needed   Diabetic Neuropathy Diabetic neuropathy refers to nerve damage that is caused by diabetes (diabetes mellitus). Over time, people with diabetes can develop nerve damage throughout the body. There are several types of diabetic neuropathy:  Peripheral neuropathy. This is the most common type of diabetic neuropathy. It causes damage to nerves that carry signals between the spinal cord and other parts of the body (peripheral nerves). This usually affects nerves in the feet and legs first, and may eventually affect the hands and arms. The damage affects the ability to sense touch or temperature.  Autonomic neuropathy. This type causes damage to nerves that control involuntary functions (autonomic nerves). These nerves carry signals that control: ? Heartbeat. ? Body temperature. ? Blood pressure. ? Urination. ? Digestion. ? Sweating. ? Sexual function. ? Response to changing blood sugar (glucose) levels.  Focal neuropathy. This type of nerve damage affects one area of the body, such as an arm, a leg, or the face. The injury may involve one nerve or a small group of nerves. Focal neuropathy can be painful and unpredictable, and occurs most often in older adults with diabetes. This often develops suddenly, but usually improves over time and does not cause long-term problems.  Proximal neuropathy. This type of nerve damage affects the nerves of the thighs, hips, buttocks, or legs. It causes severe pain, weakness, and muscle death (atrophy), usually in the thigh muscles. It is more common among older men and people who have type 2 diabetes. The length of recovery time may vary. What are the causes? Peripheral, autonomic, and  focal neuropathies are caused by diabetes that is not well controlled with treatment. The cause of proximal neuropathy is not known, but it may be caused by inflammation related to uncontrolled blood glucose levels. What are the signs or symptoms? Peripheral neuropathy Peripheral neuropathy develops slowly over time. When the nerves of the feet and legs no longer work, you may experience:  Burning, stabbing, or aching pain in the legs or feet.  Pain or cramping in the legs or feet.  Loss of feeling (numbness) and inability to feel pressure or pain in the feet. This can lead to: ? Thick calluses or sores on areas of constant pressure. ? Ulcers. ? Reduced ability to feel temperature changes.  Foot deformities.  Muscle weakness.  Loss of balance or coordination. Autonomic neuropathy The symptoms of autonomic neuropathy vary depending on which nerves are affected. Symptoms may include:  Problems with digestion, such as: ? Nausea or vomiting. ? Poor appetite. ? Bloating. ? Diarrhea or constipation. ? Trouble swallowing. ? Losing weight without trying to.  Problems with the heart, blood and lungs, such as: ? Dizziness, especially when standing up. ? Fainting. ? Shortness of breath. ? Irregular heartbeat.  Bladder problems, such as: ? Trouble starting or stopping urination. ? Leaking urine. ? Trouble emptying the bladder. ? Urinary tract infections (UTIs).  Problems with other body functions, such as: ? Sweat. You may sweat too much or too little. ? Temperature. You might get hot easily. Or, you might feel cold more than usual. ? Sexual function. Men may not be able to get or maintain an erection. Women may have vaginal dryness and difficulty  with arousal. Focal neuropathy Symptoms affect only one area of the body. Common symptoms include:  Numbness.  Tingling.  Burning pain.  Prickling feeling.  Very sensitive skin.  Weakness.  Inability to move  (paralysis).  Muscle twitching.  Muscles getting smaller (wasting).  Poor coordination.  Double or blurred vision. Proximal neuropathy  Sudden, severe pain in the hip, thigh, or buttocks. Pain may spread from the back into the legs (sciatica).  Pain and numbness in the arms and legs.  Tingling.  Loss of bladder control or bowel control.  Weakness and wasting of thigh muscles.  Difficulty getting up from a seated position.  Abdominal swelling.  Unexplained weight loss. How is this diagnosed? Diagnosis usually involves reviewing your medical history and any symptoms you have. Diagnosis varies depending on the type of neuropathy your health care provider suspects. Peripheral neuropathy Your health care provider will check areas that are affected by your nervous system (neurologic exam), such as your reflexes, how you move, and what you can feel. You may have other tests, such as:  Blood tests.  Removal and examination of fluid that surrounds the spinal cord (lumbar puncture).  CT scan.  MRI.  A test to check the nerves that control muscles (electromyogram, EMG).  Tests of how quickly messages pass through your nerves (nerve conduction velocity tests).  Removal of a small piece of nerve to be examined under a microscope (biopsy). Autonomic neuropathy You may have tests, such as:  Tests to measure your blood pressure and heart rate. This may include monitoring you while you are safely secured to an exam table that moves you from a lying position to an upright position (table tilt test).  Breathing tests to check your lungs.  Tests to check how food moves through the digestive system (gastric emptying tests).  Blood, sweat, or urine tests.  Ultrasound of your bladder.  Spinal fluid tests. Focal neuropathy This condition may be diagnosed with:  A neurologic exam.  CT scan.  MRI.  EMG.  Nerve conduction velocity tests. Proximal neuropathy There is no  test to diagnose this type of neuropathy. You may have tests to rule out other possible causes of this type of neuropathy. Tests may include:  X-rays of your spine and lumbar region.  Lumbar puncture.  MRI. How is this treated? The goal of treatment is to keep nerve damage from getting worse. The most important part of treatment is keeping your blood glucose level and your A1C level within your target range by following your diabetes management plan. Over time, maintaining lower blood glucose levels helps lessen symptoms. In some cases, you may need prescription pain medicine. Follow these instructions at home:  Lifestyle   Do not use any products that contain nicotine or tobacco, such as cigarettes and e-cigarettes. If you need help quitting, ask your health care provider.  Be physically active every day. Include strength training and balance exercises.  Follow a healthy meal plan.  Work with your health care provider to manage your blood pressure. General instructions  Follow your diabetes management plan as directed. ? Check your blood glucose levels as directed by your health care provider. ? Keep your blood glucose in your target range as directed by your health care provider. ? Have your A1C level checked at least two times a year, or as often as told by your health care provider.  Take over the counter and prescription medicines only as told by your health care provider. This includes insulin and  diabetes medicine.  Do not drive or use heavy machinery while taking prescription pain medicines.  Check your skin and feet every day for cuts, bruises, redness, blisters, or sores.  Keep all follow up visits as told by your health care provider. This is important. Contact a health care provider if:  You have burning, stabbing, or aching pain in your legs or feet.  You are unable to feel pressure or pain in your feet.  You develop problems with digestion, such  as: ? Nausea. ? Vomiting. ? Bloating. ? Constipation. ? Diarrhea. ? Abdominal pain.  You have difficulty with urination, such as inability: ? To control when you urinate (incontinence). ? To completely empty the bladder (retention).  You have palpitations.  You feel dizzy, weak, or faint when you stand up. Get help right away if:  You cannot urinate.  You have sudden weakness or loss of coordination.  You have trouble speaking.  You have pain or pressure in your chest.  You have an irregular heart beat.  You have sudden inability to move a part of your body. Summary  Diabetic neuropathy refers to nerve damage that is caused by diabetes. It can affect nerves throughout the entire body, causing numbness and pain in the arms, legs, digestive tract, heart, and other body systems.  Keep your blood glucose level and your blood pressure in your target range, as directed by your health care provider. This can help prevent neuropathy from getting worse.  Check your skin and feet every day for cuts, bruises, redness, blisters, or sores.  Do not use any products that contain nicotine or tobacco, such as cigarettes and e-cigarettes. If you need help quitting, ask your health care provider. This information is not intended to replace advice given to you by your health care provider. Make sure you discuss any questions you have with your health care provider. Document Revised: 12/24/2017 Document Reviewed: 12/16/2016 Elsevier Patient Education  2020 [place].   Carpal Tunnel Syndrome  Carpal tunnel syndrome is a condition that causes pain in your hand and arm. The carpal tunnel is a narrow area that is on the palm side of your wrist. Repeated wrist motion or certain diseases may cause swelling in the tunnel. This swelling can pinch the main nerve in the wrist (median nerve). What are the causes? This condition may be caused by:  Repeated wrist motions.  Wrist  injuries.  Arthritis.  A sac of fluid (cyst) or abnormal growth (tumor) in the carpal tunnel.  Fluid buildup during pregnancy. Sometimes the cause is not known. What increases the risk? The following factors may make you more likely to develop this condition:  Having a job in which you move your wrist in the same way many times. This includes jobs like being a Software engineer or a Scientist, water quality.  Being a woman.  Having other health conditions, such as: ? Diabetes. ? Obesity. ? A thyroid gland that is not active enough (hypothyroidism). ? Kidney failure. What are the signs or symptoms? Symptoms of this condition include:  A tingling feeling in your fingers.  Tingling or a loss of feeling (numbness) in your hand.  Pain in your entire arm. This pain may get worse when you bend your wrist and elbow for a long time.  Pain in your wrist that goes up your arm to your shoulder.  Pain that goes down into your palm or fingers.  A weak feeling in your hands. You may find it hard to grab and  hold items. You may feel worse at night. How is this diagnosed? This condition is diagnosed with a medical history and physical exam. You may also have tests, such as:  Electromyogram (EMG). This test checks the signals that the nerves send to the muscles.  Nerve conduction study. This test checks how well signals pass through your nerves.  Imaging tests, such as X-rays, ultrasound, and MRI. These tests check for what might be the cause of your condition. How is this treated? This condition may be treated with:  Lifestyle changes. You will be asked to stop or change the activity that caused your problem.  Doing exercise and activities that make bones and muscles stronger (physical therapy).  Learning how to use your hand again (occupational therapy).  Medicines for pain and swelling (inflammation). You may have injections in your wrist.  A wrist splint.  Surgery. Follow these instructions at  home: If you have a splint:  Wear the splint as told by your doctor. Remove it only as told by your doctor.  Loosen the splint if your fingers: ? Tingle. ? Lose feeling (become numb). ? Turn cold and blue.  Keep the splint clean.  If the splint is not waterproof: ? Do not let it get wet. ? Cover it with a watertight covering when you take a bath or a shower. Managing pain, stiffness, and swelling   If told, put ice on the painful area: ? If you have a removable splint, remove it as told by your doctor. ? Put ice in a plastic bag. ? Place a towel between your skin and the bag. ? Leave the ice on for 20 minutes, 2-3 times per day. General instructions  Take over-the-counter and prescription medicines only as told by your doctor.  Rest your wrist from any activity that may cause pain. If needed, talk with your boss at work about changes that can help your wrist heal.  Do any exercises as told by your doctor, physical therapist, or occupational therapist.  Keep all follow-up visits as told by your doctor. This is important. Contact a doctor if:  You have new symptoms.  Medicine does not help your pain.  Your symptoms get worse. Get help right away if:  You have very bad numbness or tingling in your wrist or hand. Summary  Carpal tunnel syndrome is a condition that causes pain in your hand and arm.  It is often caused by repeated wrist motions.  Lifestyle changes and medicines are used to treat this problem. Surgery may help in very bad cases.  Follow your doctor's instructions about wearing a splint, resting your wrist, keeping follow-up visits, and calling for help. This information is not intended to replace advice given to you by your health care provider. Make sure you discuss any questions you have with your health care provider. Document Revised: 03/20/2018 Document Reviewed: 03/20/2018 Elsevier Patient Education  [place].

## 2020-05-25 DIAGNOSIS — Z125 Encounter for screening for malignant neoplasm of prostate: Secondary | ICD-10-CM | POA: Diagnosis not present

## 2020-05-25 DIAGNOSIS — R7989 Other specified abnormal findings of blood chemistry: Secondary | ICD-10-CM | POA: Diagnosis not present

## 2020-05-25 DIAGNOSIS — E785 Hyperlipidemia, unspecified: Secondary | ICD-10-CM | POA: Diagnosis not present

## 2020-05-25 DIAGNOSIS — L309 Dermatitis, unspecified: Secondary | ICD-10-CM | POA: Diagnosis not present

## 2020-05-25 DIAGNOSIS — I10 Essential (primary) hypertension: Secondary | ICD-10-CM | POA: Diagnosis not present

## 2020-05-25 DIAGNOSIS — M7989 Other specified soft tissue disorders: Secondary | ICD-10-CM | POA: Diagnosis not present

## 2020-05-25 DIAGNOSIS — R7309 Other abnormal glucose: Secondary | ICD-10-CM | POA: Diagnosis not present

## 2020-05-25 DIAGNOSIS — R748 Abnormal levels of other serum enzymes: Secondary | ICD-10-CM | POA: Diagnosis not present

## 2020-06-01 ENCOUNTER — Encounter (HOSPITAL_COMMUNITY): Payer: Self-pay | Admitting: Emergency Medicine

## 2020-06-01 ENCOUNTER — Other Ambulatory Visit: Payer: Self-pay

## 2020-06-01 ENCOUNTER — Emergency Department (HOSPITAL_COMMUNITY)
Admission: EM | Admit: 2020-06-01 | Discharge: 2020-06-02 | Disposition: A | Discharge status: Home / Self Care | Payer: BC Managed Care – PPO | Attending: Emergency Medicine | Admitting: Emergency Medicine

## 2020-06-01 ENCOUNTER — Emergency Department (HOSPITAL_COMMUNITY): Payer: BC Managed Care – PPO

## 2020-06-01 DIAGNOSIS — R079 Chest pain, unspecified: Secondary | ICD-10-CM | POA: Diagnosis not present

## 2020-06-01 DIAGNOSIS — K449 Diaphragmatic hernia without obstruction or gangrene: Secondary | ICD-10-CM | POA: Diagnosis not present

## 2020-06-01 DIAGNOSIS — R0789 Other chest pain: Secondary | ICD-10-CM | POA: Diagnosis not present

## 2020-06-01 DIAGNOSIS — J45909 Unspecified asthma, uncomplicated: Secondary | ICD-10-CM | POA: Insufficient documentation

## 2020-06-01 DIAGNOSIS — I10 Essential (primary) hypertension: Secondary | ICD-10-CM | POA: Diagnosis not present

## 2020-06-01 DIAGNOSIS — R001 Bradycardia, unspecified: Secondary | ICD-10-CM | POA: Diagnosis not present

## 2020-06-01 DIAGNOSIS — Z79899 Other long term (current) drug therapy: Secondary | ICD-10-CM | POA: Insufficient documentation

## 2020-06-01 DIAGNOSIS — R202 Paresthesia of skin: Secondary | ICD-10-CM | POA: Insufficient documentation

## 2020-06-01 DIAGNOSIS — Z7982 Long term (current) use of aspirin: Secondary | ICD-10-CM | POA: Diagnosis not present

## 2020-06-01 DIAGNOSIS — J90 Pleural effusion, not elsewhere classified: Secondary | ICD-10-CM | POA: Diagnosis not present

## 2020-06-01 LAB — BASIC METABOLIC PANEL
Anion gap: 11 (ref 5–15)
BUN: 14 mg/dL (ref 6–20)
CO2: 23 mmol/L (ref 22–32)
Calcium: 8.9 mg/dL (ref 8.9–10.3)
Chloride: 106 mmol/L (ref 98–111)
Creatinine, Ser: 0.93 mg/dL (ref 0.61–1.24)
GFR calc Af Amer: >60 mL/min (ref >60)
GFR calc non Af Amer: >60 mL/min (ref >60)
Glucose, Bld: 143 mg/dL — ABNORMAL HIGH (ref 70–99)
Potassium: 4 mmol/L (ref 3.5–5.1)
Sodium: 140 mmol/L (ref 135–145)

## 2020-06-01 LAB — CBC
HCT: 41 % (ref 39.0–52.0)
Hemoglobin: 13.4 g/dL (ref 13.0–17.0)
MCH: 27 pg (ref 26.0–34.0)
MCHC: 32.7 g/dL (ref 30.0–36.0)
MCV: 82.7 fL (ref 80.0–100.0)
Platelets: 222 10*3/uL (ref 150–400)
RBC: 4.96 MIL/uL (ref 4.22–5.81)
RDW: 13.7 % (ref 11.5–15.5)
WBC: 6.7 10*3/uL (ref 4.0–10.5)
nRBC: 0.3 % — ABNORMAL HIGH (ref 0.0–0.2)

## 2020-06-01 LAB — TROPONIN I (HIGH SENSITIVITY)
Troponin I (High Sensitivity): 6 ng/L (ref <18)
Troponin I (High Sensitivity): 5 ng/L (ref <18)

## 2020-06-01 MED ORDER — SODIUM CHLORIDE 0.9% FLUSH: 3.0000 mL | Freq: Once | INTRAVENOUS | Status: DC

## 2020-06-01 NOTE — ED Triage Notes (Signed)
Pt c/o 3/10 left cp since yesterday, denies any SOB, nausea or vomiting.

## 2020-06-02 ENCOUNTER — Encounter (HOSPITAL_COMMUNITY): Payer: Self-pay | Admitting: Student

## 2020-06-02 NOTE — ED Provider Notes (Signed)
[facility] EMERGENCY DEPARTMENT Provider Note   CSN: [id number] Arrival date & time: 06/01/20  1535     History Chief Complaint  Patient presents with   Chest Pain    Nyal Schachter is a 59 y.o. male with a history of hypertension, hyperlipidemia, sleep apnea, fibromyalgia, and kidney stones who presents to the ED with complaints of chest pain intermittently for the past 2-3 days. Patient states the last 2 nights he has went to bed feeling okay and woken up in the middle of the night with chest discomfort described as left sided, non-radiating, and sharp in nature. Spontaneously resolved throughout the night. Today in the afternoon he was sitting at his computer when pain returned, has been waxing/waning since onset, currently a 3/10 in severity, had some paresthesias to the LUE earlier that resolved & have no re-occurred. Had pain like this awhile ago, but no evaluation at that time.  No alleviating or aggravating factors to his symptoms.  No change with exertion, deep breath, or eating.  He does have a family cardiac history, brother died of an MI in his 55s.  Denies nausea, vomiting, diaphoresis, dizziness, lightheadedness, syncope, leg pain/swelling, hemoptysis, recent surgery/trauma, recent long travel, hormone use, personal hx of cancer, or hx of DVT/PE.    HPI     Past Medical History:  Diagnosis Date   Allergy    Asthma    Barrett's esophagus    BPH (benign prostatic hyperplasia)    Fibromyalgia    GERD (gastroesophageal reflux disease)    History of hiatal hernia    History of kidney stones    Hyperlipidemia    Hypertension    Kidney stones    Peripheral neuropathy    Sleep apnea    CPAP 100%   Small fiber neuropathy     Patient Active Problem List   Diagnosis Date Noted   Peripheral neuropathy 09/18/2017   Chest pain with moderate risk of acute coronary syndrome 07/01/2014   Family history of coronary artery disease in brother  07/01/2014   Sleep apnea 07/01/2014   Obesity-  07/01/2014   Dyspnea on exertion 07/01/2014   HTN (hypertension) 07/01/2014   Dyslipidemia 07/01/2014    Past Surgical History:  Procedure Laterality Date   arm surgery Right 2005   KNEE SURGERY Bilateral 1976   LIPOMA EXCISION N/A 11/06/2016   Procedure: EXCISION LIPOMA ABDOMINAL WALL;  Surgeon: Clovis Riley, MD;  Location: [facility];  Service: General;  Laterality: N/A;   NASAL SINUS SURGERY  2001   tumor 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 Right    2001-2002       Family History  Problem Relation Age of Onset   Heart disease Father    Diabetes Father    Skin cancer Father    Heart disease Paternal Grandmother    Valvular heart disease Brother    Chiari malformation Daughter    Other Daughter        Noonan syndrome   Heart disease Paternal Grandfather    Heart disease Mother    Colon cancer Neg Hx    Stomach cancer Neg Hx    Rectal cancer Neg Hx    Esophageal cancer Neg Hx     Social History   Tobacco Use   Smoking status: Never Smoker   Smokeless tobacco: Never Used  Vaping Use   Vaping Use: Never used  Substance Use Topics   Alcohol use: Yes    Alcohol/week: 0.0 standard drinks  Comment: 2 per month   Drug use: No    Home Medications Prior to Admission medications   Medication Sig Start Date End Date Taking? Authorizing Provider  aspirin EC 81 MG tablet Take 81 mg by mouth daily.    [provider]  bisoprolol-hydrochlorothiazide (ZIAC) 5-6.25 MG tablet TAKE 1 TABLET DAILY. Please make overdue appt with Dr. Marlou Porch before anymore refills. 2nd attempt 05/23/20   Jerline Pain, MD  cetirizine (ZYRTEC) 10 MG tablet Take 10 mg by mouth daily as needed for allergies.     [provider]  doxazosin (CARDURA XL) 4 MG 24 hr tablet Take 1 tablet (4 mg total) by mouth daily with breakfast. 10/02/18   Jerline Pain, MD  DULoxetine (CYMBALTA) 60 MG capsule Take 1 capsule (60 mg  total) by mouth daily. 05/22/20   Lomax, Amy, NP  gabapentin (NEURONTIN) 300 MG capsule Take 2 capsules (600 mg total) by mouth at bedtime. 05/22/20   Lomax, Amy, NP  Multiple Vitamin (MULTIVITAMIN) tablet Take 1 tablet by mouth daily.    [provider]  Naproxen Sodium (ALEVE PO) Take 1-2 tablets by mouth 2 (two) times daily as needed. As needed     [provider]  omeprazole (PRILOSEC) 40 MG capsule Take 1 capsule (40 mg total) by mouth daily. 10/26/19   Milus Banister, MD  pravastatin (PRAVACHOL) 80 MG tablet Take 80 mg by mouth daily.    [provider]    Allergies    Patient has no known allergies.  Review of Systems   Review of Systems  Constitutional: Negative for chills, diaphoresis and fever.  Respiratory: Negative for shortness of breath.   Cardiovascular: Positive for chest pain. Negative for leg swelling.  Gastrointestinal: Negative for abdominal pain, nausea and vomiting.  Neurological: Negative for dizziness, seizures, syncope, weakness and light-headedness.  All other systems reviewed and are negative.   Physical Exam Updated Vital Signs BP 134/73    Pulse (!) 49    Temp 98.4 F (36.9 C) (Oral)    Resp 16    Ht 5\' 10"  (1.778 m)    Wt 122.9 kg    SpO2 100%    BMI 38.88 kg/m   Physical Exam Vitals and nursing note reviewed.  Constitutional:      General: He is not in acute distress.    Appearance: He is well-developed. He is not toxic-appearing.  HENT:     Head: Normocephalic and atraumatic.  Eyes:     General:        Right eye: No discharge.        Left eye: No discharge.     Conjunctiva/sclera: Conjunctivae normal.  Cardiovascular:     Rate and Rhythm: Normal rate and regular rhythm.     Pulses:          Radial pulses are 2+ on the right side and 2+ on the left side.  Pulmonary:     Effort: Pulmonary effort is normal. No respiratory distress.     Breath sounds: Normal breath sounds. No wheezing, rhonchi or rales.  Abdominal:      General: There is no distension.     Palpations: Abdomen is soft.     Tenderness: There is no abdominal tenderness.  Musculoskeletal:     Cervical back: Neck supple.     Right lower leg: No tenderness. No edema.     Left lower leg: No tenderness. No edema.  Skin:    General: Skin is  warm and dry.     Findings: No rash.  Neurological:     Mental Status: He is alert.     Comments: Clear speech.   Psychiatric:        Behavior: Behavior normal.     ED Results / Procedures / Treatments   Labs (all labs ordered are listed, but only abnormal results are displayed) Labs Reviewed  BASIC METABOLIC PANEL - Abnormal; Notable for the following components:      Result Value   Glucose, Bld 143 (*)    All other components within normal limits  CBC - Abnormal; Notable for the following components:   nRBC 0.3 (*)    All other components within normal limits  TROPONIN I (HIGH SENSITIVITY)  TROPONIN I (HIGH SENSITIVITY)    EKG None  Radiology DG Chest 2 View  Result Date: 06/01/2020 CLINICAL DATA:  59 year old male with history of chest pain. EXAM: CHEST - 2 VIEW COMPARISON:  Chest x-ray 07/10/2018. FINDINGS: Lung volumes are normal. No consolidative airspace disease. No pleural effusions. No pneumothorax. No pulmonary nodule or mass noted. Large hiatal hernia. Pulmonary vasculature and the cardiomediastinal silhouette are otherwise within normal limits. IMPRESSION: 1. No radiographic evidence of acute cardiopulmonary disease. 2. Large hiatal hernia. Electronically Signed   By: Vinnie Langton M.D.   On: 06/01/2020 16:28    Procedures Procedures (including critical care time)  Medications Ordered in ED Medications  sodium chloride flush (NS) 0.9 % injection 3 mL (3 mLs Intravenous Not Given 06/02/20 0027)    ED Course  I have reviewed the triage vital signs and the nursing notes.  Pertinent labs & imaging results that were available during my care of the patient were reviewed by me  and considered in my medical decision making (see chart for details).    MDM Rules/Calculators/A&P                         Patient presents to the ED with complaints of chest pain. Nontoxic, vitals with intermittent mild bradycardia, otherwise unremarkable.   Ddx: ACS, PE, dissection, pneumothorax, GERD, MSK, anxiety, critical anemia, arrhythmia, pericarditis.  Additional history obtained:  Additional history obtained from chart review. Previous records obtained and reviewed.  Patient had a myocardial perfusion scan 08/08/2016 which showed no evidence of myocardial ischemia with normal wall motion and ejection fraction.  EKG: No STEMI.  Lab Tests:  I Ordered, reviewed, and interpreted labs, which included:  CBC: No significant anemia or leukocytosis. BMP: No significant electrolyte derangement.  Troponins: No significant elevation, flat.   Imaging Studies ordered:  I ordered imaging studies which included CXR, I independently visualized and interpreted imaging which showed : 1. No radiographic evidence of acute cardiopulmonary disease. 2. Large hiatal hernia.   ED Course:   HEAR score 4, no STEMI, delta troponin negative, low suspicion for ACS. Patient is low risk wells, low suspicion for pulmonary embolism. Pain is not a tearing sensation, symmetric pulses, no widening of mediastinum on CXR, doubt dissection. Cardiac monitor reviewed, no notable arrhythmias or tachycardia, remains with intermittent bradycardia. Unclear definitive etiology of patient sxs, feeling somewhat improved,  has appeared hemodynamically stable throughout ER visit, plan for discharge home with close follow up with his cardiologist Dr. Marlou Porch- strict return precautions discussed. I discussed results, treatment plan, need for follow-up, and return precautions with the patient. Provided opportunity for questions, patient confirmed understanding and is in agreement with plan.   Findings and plan of care  discussed with  supervising physician Dr. Dayna Barker who is in agreement.   Portions of this note were generated with [employer]. Dictation errors may occur despite best attempts at proofreading.  Final Clinical Impression(s) / ED Diagnoses Final diagnoses:  Chest pain, unspecified type    Rx / DC Orders ED Discharge Orders    None       Amaryllis Dyke, PA-C 06/02/20 0141    Mesner, Corene Cornea, MD 06/02/20 [phone]

## 2020-06-02 NOTE — ED Notes (Signed)
Discharge instructions discussed with pt. Pt verbalized understanding with no questions at this time. Pt to follow up with outpatient cardiology.

## 2020-06-02 NOTE — Discharge Instructions (Addendum)
You were seen in the emergency department today for chest pain. Your work-up in the emergency department has been overall reassuring. Your labs have been fairly normal and or similar to previous blood work you have had done. Your EKG and the enzyme we use to check your heart did not show an acute heart attack at this time. Your chest x-ray was normal.  ° °We would like you to follow up closely with your primary care provider and/or the cardiologist provided in your discharge instructions within 1-3 days. Return to the ER immediately should you experience any new or worsening symptoms including but not limited to return of pain, worsened pain, vomiting, shortness of breath, dizziness, lightheadedness, passing out, or any other concerns that you may have.   °

## 2020-06-22 DIAGNOSIS — Z01419 Encounter for gynecological examination (general) (routine) without abnormal findings: Secondary | ICD-10-CM | POA: Diagnosis not present

## 2020-06-30 DIAGNOSIS — R7401 Elevation of levels of liver transaminase levels: Secondary | ICD-10-CM | POA: Diagnosis not present

## 2020-06-30 DIAGNOSIS — R7989 Other specified abnormal findings of blood chemistry: Secondary | ICD-10-CM | POA: Diagnosis not present

## 2020-07-03 DIAGNOSIS — Z20822 Contact with and (suspected) exposure to covid-19: Secondary | ICD-10-CM | POA: Diagnosis not present

## 2020-07-10 DIAGNOSIS — L812 Freckles: Secondary | ICD-10-CM | POA: Diagnosis not present

## 2020-07-10 DIAGNOSIS — L814 Other melanin hyperpigmentation: Secondary | ICD-10-CM | POA: Diagnosis not present

## 2020-07-10 DIAGNOSIS — L821 Other seborrheic keratosis: Secondary | ICD-10-CM | POA: Diagnosis not present

## 2020-07-10 DIAGNOSIS — L819 Disorder of pigmentation, unspecified: Secondary | ICD-10-CM | POA: Diagnosis not present

## 2020-10-04 DIAGNOSIS — L819 Disorder of pigmentation, unspecified: Secondary | ICD-10-CM | POA: Diagnosis not present

## 2020-10-04 DIAGNOSIS — L570 Actinic keratosis: Secondary | ICD-10-CM | POA: Diagnosis not present

## 2020-10-07 ENCOUNTER — Other Ambulatory Visit: Payer: Self-pay | Admitting: Gastroenterology

## 2020-10-18 ENCOUNTER — Encounter (HOSPITAL_COMMUNITY): Payer: Self-pay

## 2020-10-18 ENCOUNTER — Ambulatory Visit (INDEPENDENT_AMBULATORY_CARE_PROVIDER_SITE_OTHER): Payer: BC Managed Care – PPO

## 2020-10-18 ENCOUNTER — Ambulatory Visit (HOSPITAL_COMMUNITY)
Admission: EM | Admit: 2020-10-18 | Discharge: 2020-10-18 | Disposition: A | Discharge status: Home / Self Care | Payer: BC Managed Care – PPO | Attending: Emergency Medicine | Admitting: Emergency Medicine

## 2020-10-18 ENCOUNTER — Other Ambulatory Visit: Payer: Self-pay

## 2020-10-18 DIAGNOSIS — M25561 Pain in right knee: Secondary | ICD-10-CM

## 2020-10-18 DIAGNOSIS — W19XXXA Unspecified fall, initial encounter: Secondary | ICD-10-CM

## 2020-10-18 MED ORDER — CELECOXIB 50 MG PO CAPS: 50.0000 mg | ORAL_CAPSULE | Freq: Two times a day (BID) | ORAL | 0 refills | Status: AC

## 2020-10-18 NOTE — Discharge Instructions (Addendum)
Take Celebrex as directed. Please make follow-up with orthopedics, Dr. Mardelle Matte

## 2020-10-18 NOTE — ED Provider Notes (Signed)
____________________________________________  Time seen: Approximately 9:08 AM  I have reviewed the triage vital signs and the nursing notes.   HISTORY  Chief Complaint Fall and Leg Pain   Historian Patient    HPI Timothy [name] is a 59 y.o. male presents to the urgent care with lateral right knee pain started yesterday after patient had a mechanical fall from approximately 3 feet from a ladder.  Patient denies hitting his head or his neck.  He states that he has been ambulating but states that he has pain with ambulation.  No numbness or tingling in the lower extremities.  No back pain.  He denies chest pain, chest tightness or abdominal pain.  Patient reports that he has had surgical procedures on the bilateral knees when he was a teenager but has not had any recent procedures.  No other alleviating measures have been attempted.  Past Medical History:  Diagnosis Date  . Allergy   . Asthma   . Barrett's esophagus   . BPH (benign prostatic hyperplasia)   . Fibromyalgia   . GERD (gastroesophageal reflux disease)   . History of hiatal hernia   . History of kidney stones   . Hyperlipidemia   . Hypertension   . Kidney stones   . Peripheral neuropathy   . Sleep apnea    CPAP 100%  . Small fiber neuropathy      Immunizations up to date:  Yes.     Past Medical History:  Diagnosis Date  . Allergy   . Asthma   . Barrett's esophagus   . BPH (benign prostatic hyperplasia)   . Fibromyalgia   . GERD (gastroesophageal reflux disease)   . History of hiatal hernia   . History of kidney stones   . Hyperlipidemia   . Hypertension   . Kidney stones   . Peripheral neuropathy   . Sleep apnea    CPAP 100%  . Small fiber neuropathy     Patient Active Problem List   Diagnosis Date Noted  . Peripheral neuropathy 09/18/2017  . Chest pain with moderate risk of acute coronary syndrome 07/01/2014  . Family history of coronary artery disease in brother 07/01/2014  . Sleep apnea  07/01/2014  . Obesity-  07/01/2014  . Dyspnea on exertion 07/01/2014  . HTN (hypertension) 07/01/2014  . Dyslipidemia 07/01/2014    Past Surgical History:  Procedure Laterality Date  . arm surgery Right 2005  . KNEE SURGERY Bilateral 1976  . LIPOMA EXCISION N/A 11/06/2016   Procedure: EXCISION LIPOMA ABDOMINAL WALL;  Surgeon: Clovis Riley, MD;  Location: [facility];  Service: General;  Laterality: N/A;  . NASAL SINUS SURGERY  2001  . 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 Authorizing Provider  aspirin EC 81 MG tablet Take 81 mg by mouth daily.    [provider]  bisoprolol-hydrochlorothiazide (ZIAC) 5-6.25 MG tablet TAKE 1 TABLET DAILY. Please make overdue appt with Dr. Marlou Porch before anymore refills. 2nd attempt 05/23/20   Jerline Pain, MD  celecoxib (CELEBREX) 50 MG capsule Take 1 capsule (50 mg total) by mouth 2 (two) times daily for 7 days. 10/18/20 10/25/20  Lannie Fields, PA-C  cetirizine (ZYRTEC) 10 MG tablet Take 10 mg by mouth daily as needed for allergies.     [provider]  doxazosin (CARDURA XL) 4 MG 24 hr tablet Take 1 tablet (4 mg total)  by mouth daily with breakfast. 10/02/18   Jerline Pain, MD  DULoxetine (CYMBALTA) 60 MG capsule Take 1 capsule (60 mg total) by mouth daily. 05/22/20   Lomax, Amy, NP  gabapentin (NEURONTIN) 300 MG capsule Take 2 capsules (600 mg total) by mouth at bedtime. 05/22/20   Lomax, Amy, NP  Multiple Vitamin (MULTIVITAMIN) tablet Take 1 tablet by mouth daily.    [provider]  Naproxen Sodium (ALEVE PO) Take 1-2 tablets by mouth 2 (two) times daily as needed. As needed     [provider]  omeprazole (PRILOSEC) 40 MG capsule TAKE 1 CAPSULE DAILY 10/09/20   Milus Banister, MD  pravastatin (PRAVACHOL) 80 MG tablet Take 80 mg by mouth daily.    [provider]    Allergies Patient has no known  allergies.  Family History  Problem Relation Age of Onset  . Heart disease Father   . Diabetes Father   . Skin cancer Father   . Heart disease Paternal Grandmother   . Valvular heart disease Brother   . Chiari malformation Daughter   . Other Daughter        Noonan syndrome  . Heart disease Paternal Grandfather   . Heart disease Mother   . Colon cancer Neg Hx   . Stomach cancer Neg Hx   . Rectal cancer Neg Hx   . Esophageal cancer Neg Hx     Social History Social History   Tobacco Use  . Smoking status: Never Smoker  . Smokeless tobacco: Never Used  Vaping Use  . Vaping Use: Never used  Substance Use Topics  . Alcohol use: Yes    Alcohol/week: 0.0 standard drinks    Comment: 2 per month  . Drug use: No     Review of Systems  Constitutional: No fever/chills Eyes:  No discharge ENT: No upper respiratory complaints. Respiratory: no cough. No SOB/ use of accessory muscles to breath Gastrointestinal:   No nausea, no vomiting.  No diarrhea.  No constipation. Musculoskeletal: Patient has right knee pain.  Skin: Negative for rash, abrasions, lacerations, ecchymosis.    ____________________________________________   PHYSICAL EXAM:  VITAL SIGNS: ED Triage Vitals  Enc Vitals Group     BP 10/18/20 0857 126/77     Pulse Rate 10/18/20 0857 70     Resp 10/18/20 0857 19     Temp 10/18/20 0857 98.5 F (36.9 C)     Temp Source 10/18/20 0857 Oral     SpO2 10/18/20 0857 96 %     Weight --      Height --      Head Circumference --      Peak Flow --      Pain Score 10/18/20 0856 8     Pain Loc --      Pain Edu? --      Excl. in [place]? --      Constitutional: Alert and oriented. Well appearing and in no acute distress. Eyes: Conjunctivae are normal. PERRL. EOMI. Head: Atraumatic. ENT:      Nose: No congestion/rhinnorhea.      Mouth/Throat: Mucous membranes are moist.  Neck: No stridor.  No cervical spine tenderness to palpation. Cardiovascular: Normal rate,  regular rhythm. Normal S1 and S2.  Good peripheral circulation. Respiratory: Normal respiratory effort without tachypnea or retractions. Lungs CTAB. Good air entry to the bases with no decreased or absent breath sounds Musculoskeletal: No significant joint effusion of the right knee.  Patient has tenderness to  palpation along the lateral aspect of the right knee.  No ligamentous deficits.  Palpable dorsalis pedis pulse, right.  Capillary refill less than 2 seconds on the right. Neurologic:  Normal for age. No gross focal neurologic deficits are appreciated.  Skin:  Skin is warm, dry and intact. No rash noted. Psychiatric: Mood and affect are normal for age. Speech and behavior are normal.   ____________________________________________   LABS (all labs ordered are listed, but only abnormal results are displayed)  Labs Reviewed - No data to display ____________________________________________  EKG   ____________________________________________  RADIOLOGY Unk Pinto, personally viewed and evaluated these images (plain radiographs) as part of my medical decision making, as well as reviewing the written report by the radiologist.    DG Knee Complete 4 Views Right  Result Date: 10/18/2020 CLINICAL DATA:  Acute right knee pain after fall off ladder. EXAM: RIGHT KNEE - COMPLETE 4+ VIEW COMPARISON:  None. FINDINGS: No evidence of fracture, dislocation, or joint effusion. No evidence of arthropathy or other focal bone abnormality. Soft tissues are unremarkable. IMPRESSION: Negative. Electronically Signed   By: Marijo Conception M.D.   On: 10/18/2020 09:38    ____________________________________________    PROCEDURES  Procedure(s) performed:     Procedures     Medications - No data to display   ____________________________________________   INITIAL IMPRESSION / ASSESSMENT AND PLAN / ED COURSE  Pertinent labs & imaging results that were available during my care of the  patient were reviewed by me and considered in my medical decision making (see chart for details).      Assessment and plan Knee pain 59 year old male presents to the urgent care with acute right knee pain for the past 24 hours after a fall.  Vital signs were reassuring at triage.  On physical exam, patient did not have a significant right knee effusion and had no deficits noted with provocative testing.  Will obtain right knee x-ray and will reassess.   No bony abnormalities were visualized on x-ray.  Patient was discharged with Celebrex and a knee sleeve was provided.  He was advised to follow-up with orthopedics if symptoms persist.  All patient questions were answered.   ____________________________________________  FINAL CLINICAL IMPRESSION(S) / ED DIAGNOSES  Final diagnoses:  Acute pain of right knee      NEW MEDICATIONS STARTED DURING THIS VISIT:  ED Discharge Orders         Ordered    celecoxib (CELEBREX) 50 MG capsule  2 times daily        10/18/20 1005              This chart was dictated using voice recognition software/Dragon. Despite best efforts to proofread, errors can occur which can change the meaning. Any change was purely unintentional.     Lannie Fields, PA-C 10/18/20 1012

## 2020-10-18 NOTE — ED Triage Notes (Signed)
Pt presents with right  leg pain x 1 day. States he fell off a a latter from a 3-4 ft to the ground. Pain worsens when walking "leg wants to collapse".  Tylenol gives relief.

## 2020-10-23 DIAGNOSIS — M25561 Pain in right knee: Secondary | ICD-10-CM | POA: Diagnosis not present

## 2020-11-03 DIAGNOSIS — G4733 Obstructive sleep apnea (adult) (pediatric): Secondary | ICD-10-CM | POA: Diagnosis not present

## 2020-11-06 DIAGNOSIS — M25561 Pain in right knee: Secondary | ICD-10-CM | POA: Diagnosis not present

## 2020-11-08 DIAGNOSIS — D179 Benign lipomatous neoplasm, unspecified: Secondary | ICD-10-CM | POA: Diagnosis not present

## 2020-11-08 DIAGNOSIS — D485 Neoplasm of uncertain behavior of skin: Secondary | ICD-10-CM | POA: Diagnosis not present

## 2020-11-20 DIAGNOSIS — N200 Calculus of kidney: Secondary | ICD-10-CM | POA: Diagnosis not present

## 2020-11-20 DIAGNOSIS — R3912 Poor urinary stream: Secondary | ICD-10-CM | POA: Diagnosis not present

## 2020-12-13 DIAGNOSIS — Z20822 Contact with and (suspected) exposure to covid-19: Secondary | ICD-10-CM | POA: Diagnosis not present

## 2020-12-14 DIAGNOSIS — Z0000 Encounter for general adult medical examination without abnormal findings: Secondary | ICD-10-CM | POA: Diagnosis not present

## 2021-03-26 ENCOUNTER — Other Ambulatory Visit (HOSPITAL_BASED_OUTPATIENT_CLINIC_OR_DEPARTMENT_OTHER): Payer: Self-pay

## 2021-03-26 DIAGNOSIS — U071 COVID-19: Secondary | ICD-10-CM | POA: Diagnosis not present

## 2021-03-26 DIAGNOSIS — G4733 Obstructive sleep apnea (adult) (pediatric): Secondary | ICD-10-CM | POA: Diagnosis not present

## 2021-03-26 MED ORDER — PAXLOVID 20 X 150 MG & 10 X 100MG PO TBPK
ORAL_TABLET | ORAL | 0 refills | Status: DC
  Filled 2021-03-26: qty 30, 5d supply, fill #0

## 2021-03-27 DIAGNOSIS — U071 COVID-19: Secondary | ICD-10-CM | POA: Diagnosis not present

## 2021-03-27 DIAGNOSIS — R7303 Prediabetes: Secondary | ICD-10-CM | POA: Diagnosis not present

## 2021-03-27 DIAGNOSIS — Z125 Encounter for screening for malignant neoplasm of prostate: Secondary | ICD-10-CM | POA: Diagnosis not present

## 2021-04-11 ENCOUNTER — Other Ambulatory Visit: Payer: Self-pay | Admitting: *Deleted

## 2021-04-11 MED ORDER — GABAPENTIN 300 MG PO CAPS: 600.0000 mg | ORAL_CAPSULE | Freq: Every day | ORAL | 0 refills | Status: DC

## 2021-05-15 ENCOUNTER — Other Ambulatory Visit: Payer: Self-pay | Admitting: Family Medicine

## 2021-05-15 ENCOUNTER — Ambulatory Visit
Admission: RE | Admit: 2021-05-15 | Discharge: 2021-05-15 | Disposition: A | Discharge status: Home / Self Care | Payer: BC Managed Care – PPO | Source: Ambulatory Visit | Attending: Family Medicine | Admitting: Family Medicine

## 2021-05-15 DIAGNOSIS — G4733 Obstructive sleep apnea (adult) (pediatric): Secondary | ICD-10-CM | POA: Diagnosis not present

## 2021-05-15 DIAGNOSIS — R0600 Dyspnea, unspecified: Secondary | ICD-10-CM

## 2021-05-15 DIAGNOSIS — R0609 Other forms of dyspnea: Secondary | ICD-10-CM

## 2021-05-15 DIAGNOSIS — R5383 Other fatigue: Secondary | ICD-10-CM | POA: Diagnosis not present

## 2021-05-23 NOTE — Progress Notes (Deleted)
PATIENT: Timothy [name] DOB: [birth year]  REASON FOR VISIT: follow up HISTORY FROM: patient  No chief complaint on file.    HISTORY OF PRESENT ILLNESS: 06/04/2021 ALL:  Bear returns for follow up for neuropathy. He continues duloxetine 60mg  and gabapentin 600mg  daily.   05/22/2020 ALL:  Timothy [name] is a 60 y.o. male here today for follow up for neuropathy. We increased duloxetine dose to 60mg  daily in 10/2019. He does feel that this has helped. He has continued gabapentin 600mg  at bedtime. Numbness and tingling is still present. It is fairly constant but manageable. He travels for work and may be walking for 12-14 hours. These days are worse. He is no longer wearing a carpal tunnel brace as symptoms have resolved. He is followed regularly by PCP. DM is well controlled. He is feeling well today and without complaints.   HISTORY: (copied from my note on 11/23/2019)  Timothy [name] is a 60 y.o. male here today for follow up for neuropathy. He continues gabapentin 600mg  at night and duloxetine 30mg  daily. He feels that lower extremity numbness and tingling is progressing. Worse on left. Mostly involves bid toe and ball of foot. He does feel that symptoms improved initially with duloxetine. Over the past two months symptoms have worsened. He has also noted left thumb numbness, specifically when he wakes in the mornings. 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 He has a history of prediabetes. Last A1C was 6.    HISTORY: (copied from my note on 03/30/2019)   Timothy [name] is a 60 y.o. male for follow up of neuropathy.  Timothy [name] reports that neuropathy symptoms have steadily increased over the past year.  He reports both numbness and tingling in bilateral lower extremities.  At his last visit nearly a year ago he was advised to increase gabapentin to 600 mg at night.  He reports this did help for short period of time.  He can definitely tell if he  misses his dose of gabapentin.  He continues to be active.  He has not correlated any worsening or improvement with activity.  He feels that neuropathy is constant and not worse at any particular time.  He is under more stress currently as his daughter has been hospitalized.  He travels frequently and spends a lot of time out of town.  He is followed closely by his PCP who has been monitoring lab work.  He reports labs have been normal.     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  He is currently on gabapentin.  This was started about 1 year ago he says his symptoms have gone away on the right side but he still feels numbness and tingling in his left leg and foot he is only taking gabapentin 300 at night the morning dose makes him too drowsy.  He also has a history of obesity obstructive sleep apnea using CPAP.  He also has an area on his left knee at the back which he says swells at times.  He returns for reevaluation     UPDATE 4/26/2019CM Timothy [name],, 60 year old male returns for follow-up with history of neuropathy in both feet worse over the last 6 months.  He continues to have numbness feelings in the left arm leg and foot which are fairly constant no change in  the right side.  He was just started on gabapentin 6 months ago.  He is morbidly obese and have obstructive sleep apnea using CPAP.  His CPAP is followed by primary care.  He continues to work full-time and travels for his job.  He returns for reevaluation     UPDATE 10/25/2018CM Timothy [name], 60 year old male returns for follow-up for small fiber neuropathy.he has begun having daily symptoms of neuropathy in the left foot and lack that has been progressively getting worse over the last year.He has never been on any medication for this.He feels he cannot be as active as previously due to burning  pain in the feet. When his activity decreased his weight increased. He was recently diagnosed with hiatal hernia and Barrett's esophagitis.He has a history of obstructive sleep apnea and uses CPAP.He returns for reevaluation     04/12/16 VP23 year old right-handed male with hypertension, hypercholesteremia, sleep apnea, kidney stones, here for evaluation of back pain and numbness in the feet. Symptoms present for at least 6 months.   For past 6 months has had midline low back pain. Symptoms worse when he sleeps for a long time. Worsened early morning. Patient improved after activity. No radiating symptoms. No specific triggering factors. No history of severe accident or trauma.   Around the same time patient has also noted numbness and tingling in the balls of the feet, left worse than right side. Symptoms seem to be independent from back pain problems. Patient has had lab screening testing with B12, TSH, A1c which were unremarkable.     REVIEW OF SYSTEMS: Out of a complete 14 system review of symptoms, the patient complains only of the following symptoms, numbness of feet, and all other reviewed systems are negative.  ALLERGIES: No Known Allergies  HOME MEDICATIONS: Outpatient Medications Prior to Visit  Medication Sig Dispense Refill   aspirin EC 81 MG tablet Take 81 mg by mouth daily.     bisoprolol-hydrochlorothiazide (ZIAC) 5-6.25 MG tablet TAKE 1 TABLET DAILY. Please make overdue appt with Dr. Marlou Porch before anymore refills. 2nd attempt 15 tablet 0   cetirizine (ZYRTEC) 10 MG tablet Take 10 mg by mouth daily as needed for allergies.      doxazosin (CARDURA XL) 4 MG 24 hr tablet Take 1 tablet (4 mg total) by mouth daily with breakfast. 90 tablet 3   DULoxetine (CYMBALTA) 60 MG capsule Take 1 capsule (60 mg total) by mouth daily. 90 capsule 3   gabapentin (NEURONTIN) 300 MG capsule Take 2 capsules (600 mg total) by mouth at bedtime. 180 capsule 0   Multiple Vitamin (MULTIVITAMIN) tablet  Take 1 tablet by mouth daily.     Naproxen Sodium (ALEVE PO) Take 1-2 tablets by mouth 2 (two) times daily as needed. As needed      Nirmatrelvir & Ritonavir (PAXLOVID) 20 x 150 MG & 10 x 100MG  TBPK Take 3 tablets by mouth twice daily for 5 days. 30 tablet 0   omeprazole (PRILOSEC) 40 MG capsule TAKE 1 CAPSULE DAILY 90 capsule 3   pravastatin (PRAVACHOL) 80 MG tablet Take 80 mg by mouth daily.     No facility-administered medications prior to visit.    PAST MEDICAL HISTORY: Past Medical History:  Diagnosis Date   Allergy    Asthma    Barrett's esophagus    BPH (benign prostatic hyperplasia)    Fibromyalgia    GERD (gastroesophageal reflux disease)    History of hiatal hernia    History of kidney stones  Hyperlipidemia    Hypertension    Kidney stones    Peripheral neuropathy    Sleep apnea    CPAP 100%   Small fiber neuropathy     PAST SURGICAL HISTORY: Past Surgical History:  Procedure Laterality Date   arm surgery Right 2005   KNEE SURGERY Bilateral 1976   LIPOMA EXCISION N/A 11/06/2016   Procedure: EXCISION LIPOMA ABDOMINAL WALL;  Surgeon: Clovis Riley, MD;  Location: [facility];  Service: General;  Laterality: N/A;   NASAL SINUS SURGERY  2001   tumor 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 Right    2001-2002    FAMILY HISTORY: Family History  Problem Relation Age of Onset   Heart disease Father    Diabetes Father    Skin cancer Father    Heart disease Paternal Grandmother    Valvular heart disease Brother    Chiari malformation Daughter    Other Daughter        Noonan syndrome   Heart disease Paternal Grandfather    Heart disease Mother    Colon cancer Neg Hx    Stomach cancer Neg Hx    Rectal cancer Neg Hx    Esophageal cancer Neg Hx     SOCIAL HISTORY: Social History   Socioeconomic History   Marital status: Married    Spouse name: Angelita Ingles   Number of children: 2   Years of education: 16   Highest education level: Not on file  Occupational History    Occupation: Actuary    Comment: [place]  Tobacco Use   Smoking status: Never   Smokeless tobacco: Never  Vaping Use   Vaping Use: Never used  Substance and Sexual Activity   Alcohol use: Yes    Alcohol/week: 0.0 standard drinks    Comment: 2 per month   Drug use: No   Sexual activity: Not on file  Other Topics Concern   Not on file  Social History Narrative   Lives with wife   Caffeine use- coffee 2 cups daily   Social Determinants of Health   Financial Resource Strain: Not on file  Food Insecurity: Not on file  Transportation Needs: Not on file  Physical Activity: Not on file  Stress: Not on file  Social Connections: Not on file  Intimate Partner Violence: Not on file      PHYSICAL EXAM  There were no vitals filed for this visit.  There is no height or weight on file to calculate BMI.  Generalized: Well developed, in no acute distress  Cardiology: normal rate and rhythm, no murmur noted Respiratory: clear to auscultation bilaterally  Neurological examination  Mentation: Alert oriented to time, place, history taking. Follows all commands speech and language fluent Cranial nerve II-XII: Pupils were equal round reactive to light. Extraocular movements were full, visual field were full  Motor: The motor testing reveals 5 over 5 strength of all 4 extremities. Good symmetric motor tone is noted throughout.  Sensory: Sensory testing is intact to soft touch on all 4 extremities. No evidence of extinction is noted.   Gait and station: Gait is normal.   DIAGNOSTIC DATA (LABS, IMAGING, TESTING) - I reviewed patient records, labs, notes, testing and imaging myself where available.  No flowsheet data found.   Lab Results  Component Value Date   WBC 6.7 06/01/2020   HGB 13.4 06/01/2020   HCT 41.0 06/01/2020   MCV 82.7 06/01/2020   PLT 222 06/01/2020      Component Value  Date/Time   NA 140 06/01/2020 1553   NA 142 11/09/2018 1013   K 4.0  06/01/2020 1553   CL 106 06/01/2020 1553   CO2 23 06/01/2020 1553   GLUCOSE 143 (H) 06/01/2020 1553   BUN 14 06/01/2020 1553   BUN 13 11/09/2018 1013   CREATININE 0.93 06/01/2020 1553   CALCIUM 8.9 06/01/2020 1553   GFRNONAA >60 06/01/2020 1553   GFRAA >60 06/01/2020 1553   No results found for: CHOL, HDL, LDLCALC, LDLDIRECT, TRIG, CHOLHDL No results found for: HGBA1C No results found for: VITAMINB12 No results found for: TSH     ASSESSMENT AND PLAN 60 y.o. year old male  has a past medical history of Allergy, Asthma, Barrett's esophagus, BPH (benign prostatic hyperplasia), Fibromyalgia, GERD (gastroesophageal reflux disease), History of hiatal hernia, History of kidney stones, Hyperlipidemia, Hypertension, Kidney stones, Peripheral neuropathy, Sleep apnea, and Small fiber neuropathy. here with   No diagnosis found.   Bari is doing well today.  We will continue duloxetine 60 mg and gabapentin 600 mg daily.  He was advised that he may use alpha lipoic acid over-the-counter for additional neuropathy support if beneficial.  Carpal tunnel symptoms have resolved.  He may return to using wrist splint if needed.  We have also discussed trying Voltaren over-the-counter.  He was advised to avoid regular use of NSAIDs.  He will continue close follow-up with primary care for management of comorbidities.  Regular exercise and well-balanced diet advised.  He will follow-up with me in 1 year, sooner if needed.  He verbalizes understanding and agreement with this plan.  No orders of the defined types were placed in this encounter.    No orders of the defined types were placed in this encounter.    Debbora Presto, FNP-C 05/23/2021, 12:57 PM [facility] [address], [place], [place] [address] [phone]

## 2021-06-04 ENCOUNTER — Ambulatory Visit: Payer: BC Managed Care – PPO | Admitting: Family Medicine

## 2021-06-04 ENCOUNTER — Encounter: Payer: Self-pay | Admitting: Family Medicine

## 2021-06-04 ENCOUNTER — Other Ambulatory Visit: Payer: Self-pay

## 2021-06-04 MED ORDER — GABAPENTIN 300 MG PO CAPS: 600.0000 mg | ORAL_CAPSULE | Freq: Every day | ORAL | 0 refills | Status: DC

## 2021-06-05 ENCOUNTER — Ambulatory Visit: Payer: BC Managed Care – PPO | Admitting: Family Medicine

## 2021-06-06 NOTE — Progress Notes (Signed)
Date:  06/07/2021   ID:  Timothy [name], DOB March 20, 1961, MRN [id number]  PCP:  London Pepper, MD  Cardiologist: Rex Kras, DO, [facility] (established care 06/07/2021) Former Cardiology Providers: Candee Furbish, MD   REASON FOR CONSULT: Dyspnea  REQUESTING PHYSICIAN:  London Pepper, MD [address],  [place] [address]  Chief Complaint  Patient presents with   Shortness of Breath    Enlarged heart   New Patient (Initial Visit)     HPI  Timothy [name] is a 60 y.o. male who presents to the office with a chief complaint of " shortness of breath." Patient's past medical history and cardiovascular risk factors include: History of COVID-19 infection, hyperlipidemia, depression, GERD with history of esophagitis, large hiatal hernia, hypertension, family history of premature CAD (brother passed away due to MI at the age of 12, dad had MI at 33), mild coronary artery calcification, obesity.   He is referred to the office at the request of London Pepper, MD for evaluation of dyspnea.  Diagnosed with COVID-19 infection back in May 2022.  He was given medications and improved symptomatically.  He did well for 3 weeks thereafter and soon had a reoccurrence of shortness of breath, headaches, brain fog, and generalized fatigue.  He tested positive again for COVID-19 and since then has been having shortness of breath.  He recently had a chest x-ray which noted cardiomegaly without any pulmonary congestion and is now referred to cardiology for further evaluation and management.  Clinically patient states that his shortness of breath, headaches, brain fog are improving but not resolved.  Patient states that over the last 3 to 4 months since his COVID-19 infection he has been sedentary due to the symptoms mentioned above and has gained approximately 20 pounds.  He denies of heart failure symptoms .  He also denies chest pain at rest or with effort related activities.  He has multiple  cardiovascular risk factors which are mentioned above.  He was under the care of Dr. Marlou Porch before and has undergone an extensive cardiovascular evaluation including coronary CTA, echo, and a stress test.  Results which were reviewed as a part of this office visit and noted below for further reference.  FUNCTIONAL STATUS: Prior to May 2022 would be activity w/ home chores, daily activities, and volunteer [employer].    ALLERGIES: No Known Allergies  MEDICATION LIST PRIOR TO VISIT: Current Meds  Medication Sig   aspirin EC 81 MG tablet Take 81 mg by mouth daily.   bisoprolol-hydrochlorothiazide (ZIAC) 5-6.25 MG tablet TAKE 1 TABLET DAILY. Please make overdue appt with Dr. Marlou Porch before anymore refills. 2nd attempt   cetirizine (ZYRTEC) 10 MG tablet Take 10 mg by mouth daily as needed for allergies.    doxazosin (CARDURA XL) 4 MG 24 hr tablet Take 1 tablet (4 mg total) by mouth daily with breakfast.   DULoxetine (CYMBALTA) 60 MG capsule Take 1 capsule (60 mg total) by mouth daily.   gabapentin (NEURONTIN) 300 MG capsule Take 2 capsules (600 mg total) by mouth at bedtime.   Multiple Vitamin (MULTIVITAMIN) tablet Take 1 tablet by mouth daily.   Naproxen Sodium (ALEVE PO) Take 1-2 tablets by mouth 2 (two) times daily as needed. As needed    omeprazole (PRILOSEC) 40 MG capsule TAKE 1 CAPSULE DAILY   pravastatin (PRAVACHOL) 80 MG tablet Take 80 mg by mouth daily.     PAST MEDICAL HISTORY: Past Medical History:  Diagnosis Date   Allergy  Asthma    Barrett's esophagus    BPH (benign prostatic hyperplasia)    Fibromyalgia    GERD (gastroesophageal reflux disease)    History of hiatal hernia    History of kidney stones    Hyperlipidemia    Hypertension    Kidney stones    Peripheral neuropathy    Sleep apnea    CPAP 100%   Small fiber neuropathy     PAST SURGICAL HISTORY: Past Surgical History:  Procedure Laterality Date   arm surgery Right 2005   KNEE SURGERY Bilateral  1976   LIPOMA EXCISION N/A 11/06/2016   Procedure: EXCISION LIPOMA ABDOMINAL WALL;  Surgeon: Clovis Riley, MD;  Location: [facility];  Service: General;  Laterality: N/A;   NASAL SINUS SURGERY  2001   tumor 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 Right    2001-2002    FAMILY HISTORY: The patient family history includes Chiari malformation in his daughter; Diabetes in his father; Heart disease in his father, mother, paternal grandfather, and paternal grandmother; Other in his daughter; Skin cancer in his father; Valvular heart disease in his brother.  SOCIAL HISTORY:  The patient  reports that he has never smoked. He has never used smokeless tobacco. He reports current alcohol use. He reports that he does not use drugs.  REVIEW OF SYSTEMS: Review of Systems  Constitutional: Negative for chills and fever.  HENT:  Negative for hoarse voice and nosebleeds.   Eyes:  Negative for discharge, double vision and pain.  Cardiovascular:  Positive for dyspnea on exertion. Negative for chest pain, claudication, leg swelling, near-syncope, orthopnea, palpitations, paroxysmal nocturnal dyspnea and syncope.  Respiratory:  Negative for hemoptysis and shortness of breath.   Musculoskeletal:  Negative for muscle cramps and myalgias.  Gastrointestinal:  Negative for abdominal pain, constipation, diarrhea, hematemesis, hematochezia, melena, nausea and vomiting.  Neurological:  Positive for dizziness (improving), headaches (improving) and light-headedness (improving).   PHYSICAL EXAM: Vitals with BMI 06/07/2021 10/18/2020 06/02/2020  Height 5' 10"  - -  Weight 276 lbs 6 oz - -  BMI 40.98 - -  Systolic 119 147 829  Diastolic 66 77 69  Pulse 63 70 53    CONSTITUTIONAL: Well-developed and well-nourished. No acute distress.  SKIN: Skin is warm and dry. No rash noted. No cyanosis. No pallor. No jaundice HEAD: Normocephalic and atraumatic.  EYES: No scleral icterus MOUTH/THROAT: Moist oral membranes.  NECK: No JVD present.  No thyromegaly noted. No carotid bruits  LYMPHATIC: No visible cervical adenopathy.  CHEST Normal respiratory effort. No intercostal retractions  LUNGS: Clear to auscultation bilaterally.  No stridor. No wheezes. No rales.  CARDIOVASCULAR: Regular rate and rhythm, positive S1-S2, no murmurs rubs or gallops appreciated. ABDOMINAL: Soft, obese, nontender, nondistended, positive bowel sounds in all 4 quadrants, no apparent ascites.  EXTREMITIES: No peripheral edema, warm to touch bilaterally, 2+ dorsalis pedis and posterior tibial pulses HEMATOLOGIC: No significant bruising NEUROLOGIC: Oriented to person, place, and time. Nonfocal. Normal muscle tone.  PSYCHIATRIC: Normal mood and affect. Normal behavior. Cooperative  CARDIAC DATABASE: EKG: 06/07/2021: Normal sinus rhythm, 60 bpm, nonspecific T wave abnormality, without underlying injury pattern.  Echocardiogram: 07/15/2014: LVEF 55 to 60%, mild LVH, grade 2 diastolic impairment, mildly dilated left atrium, PASP 33 mmHg   Stress Testing: 2017: Per report, no ischemia  Heart Catheterization: None  Coronary CTA: 11/10/2018 Total coronary calcium score 1.5, 36 percentile for age and sex matched cohorts. Normal coronary origins with right dominance. Minimal nonobstructive CAD  Chest x-ray: 05/15/2021: 1.  Cardiomegaly without congestive failure. 2. Large hiatal hernia with fluid level in the herniated stomach.  LABORATORY DATA: CBC Latest Ref Rng & Units 06/01/2020 02/21/2017 11/05/2016  WBC 4.0 - 10.5 K/uL 6.7 7.4 5.5  Hemoglobin 13.0 - 17.0 g/dL 13.4 13.2 14.5  Hematocrit 39.0 - 52.0 % 41.0 40.6 43.3  Platelets 150 - 400 K/uL 222 263 236    CMP Latest Ref Rng & Units 06/01/2020 11/09/2018 02/21/2017  Glucose 70 - 99 mg/dL 143(H) 88 134(H)  BUN 6 - 20 mg/dL 14 13 15   Creatinine 0.61 - 1.24 mg/dL 0.93 1.02 1.11  Sodium 135 - 145 mmol/L 140 142 139  Potassium 3.5 - 5.1 mmol/L 4.0 4.0 3.8  Chloride 98 - 111 mmol/L 106 105 105  CO2 22 -  32 mmol/L 23 23 26   Calcium 8.9 - 10.3 mg/dL 8.9 9.3 9.0    Lipid Panel  No results found for: CHOL, TRIG, HDL, CHOLHDL, VLDL, LDLCALC, LDLDIRECT, LABVLDL  No components found for: NTPROBNP No results for input(s): PROBNP in the last 8760 hours. No results for input(s): TSH in the last 8760 hours.  BMP No results for input(s): NA, K, CL, CO2, GLUCOSE, BUN, CREATININE, CALCIUM, GFRNONAA, GFRAA in the last 8760 hours.  HEMOGLOBIN A1C No results found for: HGBA1C, MPG  External Labs:  Date Collected: 05/15/2021 , information obtained by PCP office Potassium: 4.1 Creatinine 1.00 mg/dL. eGFR: 85 mL/min per 1.73 m Hemoglobin: 12.9 g/dL and hematocrit: 39.3 % AST: 18 , ALT: 19 , alkaline phosphatase: 70  TSH: 5.6  IMPRESSION:    ICD-10-CM   1. DOE (dyspnea on exertion)  R06.00 EKG 12-Lead    PCV ECHOCARDIOGRAM COMPLETE    PCV MYOCARDIAL PERFUSION WO LEXISCAN    2. History of COVID-19  Z86.16     3. Class 2 obesity due to excess calories without serious comorbidity with body mass index (BMI) of 39.0 to 39.9 in adult  E66.09    Z68.39     4. Agatston coronary artery calcium score less than 100  R93.1 PCV ECHOCARDIOGRAM COMPLETE    PCV MYOCARDIAL PERFUSION WO LEXISCAN    5. Family history of premature CAD  Z82.49     6. Abnormal electrocardiogram  R94.31 PCV MYOCARDIAL PERFUSION WO LEXISCAN    7. Prediabetes  R73.03     8. Mixed hyperlipidemia  E78.2     9. OSA on CPAP  G47.33    Z99.89     10. Gastroesophageal reflux disease, unspecified whether esophagitis present  K21.9        RECOMMENDATIONS: Kieran Arreguin is a 60 y.o. male whose past medical history and cardiac risk factors include: History of COVID-19 infection, hyperlipidemia, depression, GERD with history of esophagitis, large hiatal hernia, hypertension, family history of premature CAD (brother passed away due to MI at the age of 17, dad had MI at 46), mild coronary artery calcification, obesity.   Dyspnea on  exertion: Improving after having 2 COVID-19 infections since May 2022. Work-up noted cardiomegaly on chest x-ray and referred to cardiology for further evaluation and management given his symptoms, chest x-ray findings, and risk factors. Clinically patient is euvolemic, no JVP on examination, and BNP within normal limits.  Probability of congestive heart failure is quite low. However, would still recommend an echocardiogram to evaluate for LVEF as COVID-19 infection can cause cardiomyopathy. He had a coronary CTA in 2019 which noted very minimal coronary calcification and no obstructive CAD.   However given his symptoms of progressive dyspnea  on exertion recommend stress test to evaluate for reversible ischemia and functional status. Recommended establishing care with pulmonary medicine to see if he has any pulmonary sequelae after having COVID-19 infections.  Will defer to primary team at this time  Family history of premature CAD: Both his father and brother have documented premature CAD as noted above Continue aspirin 81 mg p.o. daily and statin therapy given his coronary calcium score and risk factors. Monitor for now  Benign essential hypertension: Office blood pressures within acceptable range. Medications reconciled. Educated on importance of a low-salt diet Currently managed by primary care provider.  OSA on CPAP: Patient endorses compliance with the CPAP machine on a daily basis.  FINAL MEDICATION LIST END OF ENCOUNTER: No orders of the defined types were placed in this encounter.   Medications Discontinued During This Encounter  Medication Reason   Nirmatrelvir & Ritonavir (PAXLOVID) 20 x 150 MG & 10 x 100MG TBPK Error     Current Outpatient Medications:    aspirin EC 81 MG tablet, Take 81 mg by mouth daily., Disp: , Rfl:    bisoprolol-hydrochlorothiazide (ZIAC) 5-6.25 MG tablet, TAKE 1 TABLET DAILY. Please make overdue appt with Dr. Marlou Porch before anymore refills. 2nd  attempt, Disp: 15 tablet, Rfl: 0   cetirizine (ZYRTEC) 10 MG tablet, Take 10 mg by mouth daily as needed for allergies. , Disp: , Rfl:    doxazosin (CARDURA XL) 4 MG 24 hr tablet, Take 1 tablet (4 mg total) by mouth daily with breakfast., Disp: 90 tablet, Rfl: 3   DULoxetine (CYMBALTA) 60 MG capsule, Take 1 capsule (60 mg total) by mouth daily., Disp: 90 capsule, Rfl: 3   gabapentin (NEURONTIN) 300 MG capsule, Take 2 capsules (600 mg total) by mouth at bedtime., Disp: 180 capsule, Rfl: 0   Multiple Vitamin (MULTIVITAMIN) tablet, Take 1 tablet by mouth daily., Disp: , Rfl:    Naproxen Sodium (ALEVE PO), Take 1-2 tablets by mouth 2 (two) times daily as needed. As needed , Disp: , Rfl:    omeprazole (PRILOSEC) 40 MG capsule, TAKE 1 CAPSULE DAILY, Disp: 90 capsule, Rfl: 3   pravastatin (PRAVACHOL) 80 MG tablet, Take 80 mg by mouth daily., Disp: , Rfl:   Orders Placed This Encounter  Procedures   PCV MYOCARDIAL PERFUSION WO LEXISCAN   EKG 12-Lead   PCV ECHOCARDIOGRAM COMPLETE   There are no Patient Instructions on file for this visit.   --Continue cardiac medications as reconciled in final medication list. --Return in about 4 weeks (around 07/05/2021) for Follow up, Dyspnea, Review test results. Or sooner if needed. --Continue follow-up with your primary care physician regarding the management of your other chronic comorbid conditions.  Patient's questions and concerns were addressed to his satisfaction. He voices understanding of the instructions provided during this encounter.   This note was created using a voice recognition software as a result there may be grammatical errors inadvertently enclosed that do not reflect the nature of this encounter. Every attempt is made to correct such errors.  Rex Kras, [place], [facility]  Pager: [phone] Office: [phone]

## 2021-06-07 ENCOUNTER — Encounter: Payer: Self-pay | Admitting: Cardiology

## 2021-06-07 ENCOUNTER — Other Ambulatory Visit: Payer: Self-pay

## 2021-06-07 ENCOUNTER — Ambulatory Visit: Payer: BC Managed Care – PPO | Admitting: Cardiology

## 2021-06-07 VITALS — BP 132/66 | HR 63 | Temp 97.4°F | Resp 16 | Ht 70.0 in | Wt 276.4 lb

## 2021-06-07 DIAGNOSIS — E782 Mixed hyperlipidemia: Secondary | ICD-10-CM

## 2021-06-07 DIAGNOSIS — Z8249 Family history of ischemic heart disease and other diseases of the circulatory system: Secondary | ICD-10-CM

## 2021-06-07 DIAGNOSIS — R0609 Other forms of dyspnea: Secondary | ICD-10-CM

## 2021-06-07 DIAGNOSIS — Z6839 Body mass index (BMI) 39.0-39.9, adult: Secondary | ICD-10-CM

## 2021-06-07 DIAGNOSIS — E6609 Other obesity due to excess calories: Secondary | ICD-10-CM | POA: Diagnosis not present

## 2021-06-07 DIAGNOSIS — R0600 Dyspnea, unspecified: Secondary | ICD-10-CM

## 2021-06-07 DIAGNOSIS — R9431 Abnormal electrocardiogram [ECG] [EKG]: Secondary | ICD-10-CM

## 2021-06-07 DIAGNOSIS — Z8616 Personal history of COVID-19: Secondary | ICD-10-CM | POA: Diagnosis not present

## 2021-06-07 DIAGNOSIS — R7303 Prediabetes: Secondary | ICD-10-CM

## 2021-06-07 DIAGNOSIS — G4733 Obstructive sleep apnea (adult) (pediatric): Secondary | ICD-10-CM | POA: Diagnosis not present

## 2021-06-07 DIAGNOSIS — R931 Abnormal findings on diagnostic imaging of heart and coronary circulation: Secondary | ICD-10-CM

## 2021-06-07 DIAGNOSIS — K219 Gastro-esophageal reflux disease without esophagitis: Secondary | ICD-10-CM

## 2021-06-11 ENCOUNTER — Other Ambulatory Visit: Payer: Self-pay

## 2021-06-11 ENCOUNTER — Ambulatory Visit: Payer: BC Managed Care – PPO

## 2021-06-11 DIAGNOSIS — R9431 Abnormal electrocardiogram [ECG] [EKG]: Secondary | ICD-10-CM | POA: Diagnosis not present

## 2021-06-11 DIAGNOSIS — R0609 Other forms of dyspnea: Secondary | ICD-10-CM

## 2021-06-11 DIAGNOSIS — R931 Abnormal findings on diagnostic imaging of heart and coronary circulation: Secondary | ICD-10-CM

## 2021-06-11 DIAGNOSIS — R0600 Dyspnea, unspecified: Secondary | ICD-10-CM

## 2021-06-20 ENCOUNTER — Ambulatory Visit (INDEPENDENT_AMBULATORY_CARE_PROVIDER_SITE_OTHER): Payer: BC Managed Care – PPO | Admitting: Diagnostic Neuroimaging

## 2021-06-20 ENCOUNTER — Encounter: Payer: Self-pay | Admitting: Diagnostic Neuroimaging

## 2021-06-20 VITALS — BP 125/81 | HR 61 | Ht 70.0 in | Wt 275.0 lb

## 2021-06-20 DIAGNOSIS — U099 Post covid-19 condition, unspecified: Secondary | ICD-10-CM

## 2021-06-20 DIAGNOSIS — G6289 Other specified polyneuropathies: Secondary | ICD-10-CM

## 2021-06-20 MED ORDER — DULOXETINE HCL 60 MG PO CPEP: 60.0000 mg | ORAL_CAPSULE | Freq: Every day | ORAL | 4 refills | Status: DC

## 2021-06-20 MED ORDER — GABAPENTIN 300 MG PO CAPS: 600.0000 mg | ORAL_CAPSULE | Freq: Every day | ORAL | 4 refills | Status: DC

## 2021-06-20 NOTE — Patient Instructions (Signed)
  POST COVID SYNDROME (brain fog, headaches, fatigue) - daily physical activity / exercise (at least 5-30 minutes) - eat more plants / vegetables - increase social activities, brain stimulation, games, puzzles, hobbies, crafts, arts, music - aim for at least 7-8 hours sleep per night (or more) - avoid smoking and alcohol

## 2021-06-20 NOTE — Progress Notes (Signed)
[facility]  PATIENT: Timothy [name] DOB: 1961/07/15  REFERRING CLINICIAN: London Pepper, MD HISTORY FROM: patient  REASON FOR VISIT: new consult   HISTORICAL  CHIEF COMPLAINT:  Chief Complaint  Patient presents with   Follow-up    RM 6 alone Pt is well, got covid in May and started having intense headaches/brain fog, nausea and fatigue but symptoms have currently decreased but still has a bad headache about every 3 days.     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  May 2022 patient had COVID with malaise, fatigue, cough, fevers.  He was prescribed paxlovid and had some improvement.  However after few days symptoms returned.  This time he had more severe COVID symptoms.  Also had headaches, brain fog and fatigue.  Patient having 1-2 headaches per day lasting 1 hour at a time.  Headaches occurring on a daily basis.  Also having significant fatigue and brain fog.  Fortunately his symptoms are gradually proving over time.  He is using Tylenol with mild relief.  He is sleeping well using CPAP every night.    REVIEW OF SYSTEMS: Full 14 system review of systems performed and negative with exception of: as per HPI.   ALLERGIES: No Known Allergies  HOME MEDICATIONS: Outpatient Medications Prior to Visit  Medication Sig Dispense Refill   aspirin EC 81 MG tablet Take 81 mg by mouth daily.     bisoprolol-hydrochlorothiazide (ZIAC) 5-6.25 MG tablet TAKE 1 TABLET DAILY. Please make overdue appt with Dr. Marlou Porch before anymore refills. 2nd attempt 15 tablet 0   cetirizine (ZYRTEC) 10 MG tablet Take 10 mg by mouth daily as needed for allergies.      doxazosin (CARDURA XL) 4 MG 24 hr tablet Take 1 tablet (4 mg total) by mouth daily with breakfast. 90 tablet 3   DULoxetine (CYMBALTA) 60 MG capsule Take 1 capsule (60 mg total) by mouth daily. 90 capsule 3   gabapentin (NEURONTIN) 300 MG capsule Take 2 capsules (600 mg total)  by mouth at bedtime. 180 capsule 0   Multiple Vitamin (MULTIVITAMIN) tablet Take 1 tablet by mouth daily.     Naproxen Sodium (ALEVE PO) Take 1-2 tablets by mouth 2 (two) times daily as needed. As needed      omeprazole (PRILOSEC) 40 MG capsule TAKE 1 CAPSULE DAILY 90 capsule 3   pravastatin (PRAVACHOL) 80 MG tablet Take 80 mg by mouth daily.     No facility-administered medications prior to visit.    PAST MEDICAL HISTORY: Past Medical History:  Diagnosis Date   Allergy    Asthma    Barrett's esophagus    BPH (benign prostatic hyperplasia)    Fibromyalgia    GERD (gastroesophageal reflux disease)    History of hiatal hernia    History of kidney stones    Hyperlipidemia    Hypertension    Kidney stones    Peripheral neuropathy    Sleep apnea    CPAP 100%   Small fiber neuropathy     PAST SURGICAL HISTORY: Past Surgical History:  Procedure Laterality Date   arm surgery Right 2005   KNEE SURGERY Bilateral 1976   LIPOMA EXCISION N/A 11/06/2016   Procedure: EXCISION LIPOMA ABDOMINAL WALL;  Surgeon: Clovis Riley, MD;  Location: [facility];  Service: General;  Laterality: N/A;   NASAL SINUS SURGERY  2001   tumor 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 Right    2001-2002    FAMILY  HISTORY: Family History  Problem Relation Age of Onset   Heart disease Mother    Heart disease Father    Diabetes Father    Skin cancer Father    Valvular heart disease Brother    Heart disease Paternal Grandmother    Heart disease Paternal Grandfather    Chiari malformation Daughter    Other Daughter        Noonan syndrome   Colon cancer Neg Hx    Stomach cancer Neg Hx    Rectal cancer Neg Hx    Esophageal cancer Neg Hx     SOCIAL HISTORY: Social History   Socioeconomic History   Marital status: Married    Spouse name: Angelita Ingles   Number of children: 2   Years of education: 16   Highest education level: Not on file  Occupational History   Occupation: Actuary    Comment: [employer]  Tobacco Use   Smoking status: Never   Smokeless tobacco: Never  Vaping Use   Vaping Use: Never used  Substance and Sexual Activity   Alcohol use: Yes    Alcohol/week: 0.0 standard drinks    Comment: 2 per month   Drug use: No   Sexual activity: Not on file  Other Topics Concern   Not on file  Social History Narrative   Lives with wife   Caffeine use- coffee 2 cups daily   Social Determinants of Health   Financial Resource Strain: Not on file  Food Insecurity: Not on file  Transportation Needs: Not on file  Physical Activity: Not on file  Stress: Not on file  Social Connections: Not on file  Intimate Partner Violence: Not on file     PHYSICAL EXAM  GENERAL EXAM/CONSTITUTIONAL: Vitals:  Vitals:   06/20/21 1246  BP: 125/81  Pulse: 61  Weight: 275 lb (124.7 kg)  Height: '5\' 10"'$  (1.778 m)   Body mass index is 39.46 kg/m. Wt Readings from Last 3 Encounters:  06/20/21 275 lb (124.7 kg)  06/07/21 276 lb 6.4 oz (125.4 kg)  06/02/20 270 lb 15.1 oz (122.9 kg)   Patient is in no distress; well developed, nourished and groomed; neck is supple  CARDIOVASCULAR: Examination of carotid arteries is normal; no carotid bruits Regular rate and rhythm, no murmurs Examination of peripheral vascular system by observation and palpation is normal  EYES: Ophthalmoscopic exam of optic discs and posterior segments is normal; no papilledema or hemorrhages No results found.  MUSCULOSKELETAL: Gait, strength, tone, movements noted in Neurologic exam below  NEUROLOGIC: MENTAL STATUS:  No flowsheet data found. awake, alert, oriented to person, place and time recent and remote memory intact normal attention and concentration language fluent, comprehension intact, naming intact fund of knowledge appropriate  CRANIAL NERVE:  2nd - no papilledema on fundoscopic exam 2nd, 3rd, 4th, 6th - pupils equal and reactive to light, visual fields full to confrontation, extraocular  muscles intact, no nystagmus 5th - facial sensation symmetric 7th - facial strength symmetric 8th - hearing intact 9th - palate elevates symmetrically, uvula midline 11th - shoulder shrug symmetric 12th - tongue protrusion midline  MOTOR:  normal bulk and tone, full strength in the BUE, BLE  SENSORY:  normal and symmetric to light touch, temperature, vibration  COORDINATION:  finger-nose-finger, fine finger movements normal  REFLEXES:  deep tendon reflexes TRACE and symmetric  GAIT/STATION:  narrow based gait     DIAGNOSTIC DATA (LABS, IMAGING, TESTING) - I reviewed patient records, labs, notes, testing and  imaging myself where available.  Lab Results  Component Value Date   WBC 6.7 06/01/2020   HGB 13.4 06/01/2020   HCT 41.0 06/01/2020   MCV 82.7 06/01/2020   PLT 222 06/01/2020      Component Value Date/Time   NA 140 06/01/2020 1553   NA 142 11/09/2018 1013   K 4.0 06/01/2020 1553   CL 106 06/01/2020 1553   CO2 23 06/01/2020 1553   GLUCOSE 143 (H) 06/01/2020 1553   BUN 14 06/01/2020 1553   BUN 13 11/09/2018 1013   CREATININE 0.93 06/01/2020 1553   CALCIUM 8.9 06/01/2020 1553   GFRNONAA >60 06/01/2020 1553   GFRAA >60 06/01/2020 1553   No results found for: CHOL, HDL, LDLCALC, LDLDIRECT, TRIG, CHOLHDL No results found for: HGBA1C No results found for: VITAMINB12 No results found for: TSH      ASSESSMENT AND PLAN  60 y.o. year old male here with:  Dx:  1. Post-COVID syndrome   2. 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    Dispense:  90 capsule    Refill:  4   gabapentin (NEURONTIN) 300 MG capsule    Sig: Take 2 capsules (600 mg total) by mouth at bedtime.    Dispense:  180 capsule    Refill:  4   Return in about 1 year (around 06/20/2022) for with NP (Amy Lomax).    Penni Bombard, MD 0000000, [id number] PM Certified in Neurology, Neurophysiology and Neuroimaging  [facility] [address] [place], [place] [address] [phone]

## 2021-06-22 NOTE — Progress Notes (Signed)
Called and spoke to pt regarding echo results. Pt voiced understanding.

## 2021-07-06 ENCOUNTER — Other Ambulatory Visit: Payer: Self-pay

## 2021-07-06 ENCOUNTER — Ambulatory Visit: Payer: BC Managed Care – PPO | Admitting: Cardiology

## 2021-07-06 ENCOUNTER — Encounter: Payer: Self-pay | Admitting: Cardiology

## 2021-07-06 VITALS — BP 126/72 | HR 53 | Temp 97.8°F | Resp 16 | Ht 70.0 in | Wt 280.0 lb

## 2021-07-06 DIAGNOSIS — G4733 Obstructive sleep apnea (adult) (pediatric): Secondary | ICD-10-CM

## 2021-07-06 DIAGNOSIS — R7303 Prediabetes: Secondary | ICD-10-CM

## 2021-07-06 DIAGNOSIS — Z8616 Personal history of COVID-19: Secondary | ICD-10-CM

## 2021-07-06 DIAGNOSIS — R0609 Other forms of dyspnea: Secondary | ICD-10-CM

## 2021-07-06 DIAGNOSIS — Z6841 Body Mass Index (BMI) 40.0 and over, adult: Secondary | ICD-10-CM

## 2021-07-06 DIAGNOSIS — R931 Abnormal findings on diagnostic imaging of heart and coronary circulation: Secondary | ICD-10-CM

## 2021-07-06 DIAGNOSIS — E6609 Other obesity due to excess calories: Secondary | ICD-10-CM

## 2021-07-06 DIAGNOSIS — Z8249 Family history of ischemic heart disease and other diseases of the circulatory system: Secondary | ICD-10-CM | POA: Diagnosis not present

## 2021-07-06 DIAGNOSIS — R0600 Dyspnea, unspecified: Secondary | ICD-10-CM

## 2021-07-06 DIAGNOSIS — R9431 Abnormal electrocardiogram [ECG] [EKG]: Secondary | ICD-10-CM

## 2021-07-06 DIAGNOSIS — E782 Mixed hyperlipidemia: Secondary | ICD-10-CM

## 2021-07-06 NOTE — Progress Notes (Signed)
Date:  07/06/2021   ID:  [name], DOB [birth year], MRN [id number]  PCP:  London Pepper, MD  Cardiologist: Rex Kras, DO, [facility] (established care 06/07/2021) Former Cardiology Providers: Candee Furbish, MD   Date: 07/06/21 Last Office Visit: 06/07/2021  Chief Complaint  Patient presents with   dyspnea on exertion   Results   Follow-up    HPI  Timothy [name] is a 60 y.o. male who presents to the office with a chief complaint of " reevaluation of shortness of breath and discuss test results." Patient's past medical history and cardiovascular risk factors include: History of COVID-19 infection, hyperlipidemia, depression, GERD with history of esophagitis, large hiatal hernia, hypertension, family history of premature CAD (brother passed away due to MI at the age of 78, dad had MI at 78), mild coronary artery calcification, obesity.   He is referred to the office at the request of London Pepper, MD for evaluation of dyspnea.  Since May 2022 patient has been tested positive for COVID-19 infection twice and has been experiencing shortness of breath.  Recently prior to establishing care he had a chest x-ray which noted cardiomegaly without any pulmonary vascular congestion and referred to cardiology for further evaluation.  Patient states that he was not able to get into Dr. Marlou Porch office and therefore followed up with [place] for evaluation.  At the last office visit the shared decision was to proceed with an echocardiogram and stress test.  Results reviewed with him in great detail and noted below for further reference.  In summary, LVEF is preserved without any significant valvular heart disease and stress test was overall a low risk study.  Clinically patient states that his shortness of breath, tiredness, lightheadedness, dizziness all have improved since last office visit but he is not back to baseline entirely.  FUNCTIONAL STATUS: Prior to May 2022 would be activity w/ home chores, daily  activities, and volunteer [employer].    ALLERGIES: No Known Allergies  MEDICATION LIST PRIOR TO VISIT: Current Meds  Medication Sig   aspirin EC 81 MG tablet Take 81 mg by mouth daily.   bisoprolol-hydrochlorothiazide (ZIAC) 5-6.25 MG tablet TAKE 1 TABLET DAILY. Please make overdue appt with Dr. Marlou Porch before anymore refills. 2nd attempt   cetirizine (ZYRTEC) 10 MG tablet Take 10 mg by mouth daily as needed for allergies.    doxazosin (CARDURA XL) 4 MG 24 hr tablet Take 1 tablet (4 mg total) by mouth daily with breakfast.   DULoxetine (CYMBALTA) 60 MG capsule Take 1 capsule (60 mg total) by mouth daily.   gabapentin (NEURONTIN) 300 MG capsule Take 2 capsules (600 mg total) by mouth at bedtime.   Multiple Vitamin (MULTIVITAMIN) tablet Take 1 tablet by mouth daily.   Naproxen Sodium (ALEVE PO) Take 1-2 tablets by mouth 2 (two) times daily as needed. As needed    omeprazole (PRILOSEC) 40 MG capsule TAKE 1 CAPSULE DAILY   pravastatin (PRAVACHOL) 80 MG tablet Take 80 mg by mouth daily.     PAST MEDICAL HISTORY: Past Medical History:  Diagnosis Date   Allergy    Asthma    Barrett's esophagus    BPH (benign prostatic hyperplasia)    Fibromyalgia    GERD (gastroesophageal reflux disease)    History of hiatal hernia    History of kidney stones    Hyperlipidemia    Hypertension    Kidney stones    Peripheral neuropathy    Sleep apnea    CPAP 100%   Small  fiber neuropathy     PAST SURGICAL HISTORY: Past Surgical History:  Procedure Laterality Date   arm surgery Right 2005   KNEE SURGERY Bilateral 1976   LIPOMA EXCISION N/A 11/06/2016   Procedure: EXCISION LIPOMA ABDOMINAL WALL;  Surgeon: Clovis Riley, MD;  Location: [facility];  Service: General;  Laterality: N/A;   NASAL SINUS SURGERY  2001   tumor 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 Right    2001-2002    FAMILY HISTORY: The patient family history includes Chiari malformation in his daughter; Diabetes in his father; Heart  disease in his father, mother, paternal grandfather, and paternal grandmother; Other in his daughter; Skin cancer in his father; Valvular heart disease in his brother.  SOCIAL HISTORY:  The patient  reports that he has never smoked. He has never used smokeless tobacco. He reports current alcohol use. He reports that he does not use drugs.  REVIEW OF SYSTEMS: Review of Systems  Constitutional: Negative for chills and fever.  HENT:  Negative for hoarse voice and nosebleeds.   Eyes:  Negative for discharge, double vision and pain.  Cardiovascular:  Positive for dyspnea on exertion (improved compared to prior visit.). Negative for chest pain, claudication, leg swelling, near-syncope, orthopnea, palpitations, paroxysmal nocturnal dyspnea and syncope.  Respiratory:  Negative for hemoptysis and shortness of breath.   Musculoskeletal:  Negative for muscle cramps and myalgias.  Gastrointestinal:  Negative for abdominal pain, constipation, diarrhea, hematemesis, hematochezia, melena, nausea and vomiting.  Neurological:  Positive for dizziness (improved compared to prior visit.), headaches (improved compared to prior visit.) and light-headedness (improved compared to prior visit.).   PHYSICAL EXAM: Vitals with BMI 07/06/2021 06/20/2021 06/07/2021  Height _0  _1  _2   Weight 280 lbs 275 lbs 276 lbs 6 oz  BMI 40.18 40.08 67.61  Systolic 950 932 671  Diastolic 72 81 66  Pulse 53 61 63    CONSTITUTIONAL: Well-developed and well-nourished. No acute distress.  SKIN: Skin is warm and dry. No rash noted. No cyanosis. No pallor. No jaundice HEAD: Normocephalic and atraumatic.  EYES: No scleral icterus MOUTH/THROAT: Moist oral membranes.  NECK: No JVD present. No thyromegaly noted. No carotid bruits  LYMPHATIC: No visible cervical adenopathy.  CHEST Normal respiratory effort. No intercostal retractions  LUNGS: Clear to auscultation bilaterally.  No stridor. No wheezes. No rales.  CARDIOVASCULAR:  Regular rate and rhythm, positive S1-S2, no murmurs rubs or gallops appreciated. ABDOMINAL: Soft, obese, nontender, nondistended, positive bowel sounds in all 4 quadrants, no apparent ascites.  EXTREMITIES: No peripheral edema, warm to touch bilaterally, 2+ dorsalis pedis and posterior tibial pulses HEMATOLOGIC: No significant bruising NEUROLOGIC: Oriented to person, place, and time. Nonfocal. Normal muscle tone.  PSYCHIATRIC: Normal mood and affect. Normal behavior. Cooperative  CARDIAC DATABASE: EKG: 06/07/2021: Normal sinus rhythm, 60 bpm, nonspecific T wave abnormality, without underlying injury pattern.  Echocardiogram: 06/11/2021: Study Quality: Technically difficult study. Normal LV systolic function with visual EF 55-60%. Left ventricle cavity is normal in size. Normal global wall motion. Normal diastolic filling pattern, normal LAP. Mild tricuspid regurgitation. No evidence of pulmonary hypertension. Compared to study 07/15/2014 no significant change.   Stress Testing: Exercise Sestamibi stress test 06/11/2021: 1 Day Rest/Stress Protocol. Functional status: Fair. Chest pain: None Reason for stopping exercise: Fatigue and weakness. Hypertensive response to exercise: Yes. Exercise time 6 minutes 59 seconds on Bruce protocol, achieved 8.55 METS, and 86% of APMHR.  Stress ECG negative for ischemia. Overall normal myocardial perfusion with soft tissue attenuation artifact, without convincing  evidence of reversible myocardial ischemia or prior infarct. Calculated LVEF 68%, left ventricular wall thickness is preserved without regional wall motion abnormalities. Low risk study.  Heart Catheterization: None  Coronary CTA: 11/10/2018 Total coronary calcium score 1.5, 36 percentile for age and sex matched cohorts. Normal coronary origins with right dominance. Minimal nonobstructive CAD  Chest x-ray: 05/15/2021: 1. Cardiomegaly without congestive failure. 2. Large hiatal hernia  with fluid level in the herniated stomach.  LABORATORY DATA: CBC Latest Ref Rng & Units 06/01/2020 02/21/2017 11/05/2016  WBC 4.0 - 10.5 K/uL 6.7 7.4 5.5  Hemoglobin 13.0 - 17.0 g/dL 13.4 13.2 14.5  Hematocrit 39.0 - 52.0 % 41.0 40.6 43.3  Platelets 150 - 400 K/uL 222 263 236    CMP Latest Ref Rng & Units 06/01/2020 11/09/2018 02/21/2017  Glucose 70 - 99 mg/dL 143(H) 88 134(H)  BUN 6 - 20 mg/dL _0 Creatinine 0.61 - 1.24 mg/dL 0.93 1.02 1.11  Sodium 135 - 145 mmol/L 140 142 139  Potassium 3.5 - 5.1 mmol/L 4.0 4.0 3.8  Chloride 98 - 111 mmol/L 106 105 105  CO2 22 - 32 mmol/L _1 Calcium 8.9 - 10.3 mg/dL 8.9 9.3 9.0    Lipid Panel  No results found for: CHOL, TRIG, HDL, CHOLHDL, VLDL, LDLCALC, LDLDIRECT, LABVLDL  No components found for: NTPROBNP No results for input(s): PROBNP in the last 8760 hours. No results for input(s): TSH in the last 8760 hours.  BMP No results for input(s): NA, K, CL, CO2, GLUCOSE, BUN, CREATININE, CALCIUM, GFRNONAA, GFRAA in the last 8760 hours.  HEMOGLOBIN A1C No results found for: HGBA1C, MPG  External Labs:  Date Collected: 05/15/2021 , information obtained by PCP office Potassium: 4.1 Creatinine 1.00 mg/dL. eGFR: 85 mL/min per 1.73 m Hemoglobin: 12.9 g/dL and hematocrit: 39.3 % AST: 18 , ALT: 19 , alkaline phosphatase: 70  TSH: 5.6  IMPRESSION:    ICD-10-CM   1. DOE (dyspnea on exertion)  R06.00     2. History of COVID-19  Z86.16     3. Agatston coronary artery calcium score less than 100  R93.1     4. Family history of premature CAD  Z82.49     5. Prediabetes  R73.03     6. Mixed hyperlipidemia  E78.2     7. OSA on CPAP  G47.33    Z99.89     8. Class 3 severe obesity due to excess calories without serious comorbidity with body mass index (BMI) of 40.0 to 44.9 in adult Timothy [name])  E66.01    Z68.41        RECOMMENDATIONS: Timothy [name] is a 60 y.o. male whose past medical history and cardiac risk factors include: History of  COVID-19 infection, hyperlipidemia, depression, GERD with history of esophagitis, large hiatal hernia, hypertension, family history of premature CAD (brother passed away due to MI at the age of 46, dad had MI at 50), mild coronary artery calcification, obesity.   Dyspnea on exertion: Improving  Reviewed the results of the echo and stress test during today's visit.  No additional testing warranted at this time.  Continue current medications.   Family history of premature CAD: Both his father and brother have documented premature CAD as noted above Continue aspirin and statin therapy given his coronary calcium score and risk factors. Monitor for now  Benign essential hypertension: Office blood pressures within acceptable range. Medications reconciled. Educated on importance of a low-salt diet Currently managed by primary care provider.  OSA on CPAP: Patient endorses compliance with the CPAP machine on a daily basis.  Patient plans to resume his cardiovascular care with Dr.Mark Marlou Porch on August 24, 2021.  FINAL MEDICATION LIST END OF ENCOUNTER: No orders of the defined types were placed in this encounter.   There are no discontinued medications.    Current Outpatient Medications:    aspirin EC 81 MG tablet, Take 81 mg by mouth daily., Disp: , Rfl:    bisoprolol-hydrochlorothiazide (ZIAC) 5-6.25 MG tablet, TAKE 1 TABLET DAILY. Please make overdue appt with Dr. Marlou Porch before anymore refills. 2nd attempt, Disp: 15 tablet, Rfl: 0   cetirizine (ZYRTEC) 10 MG tablet, Take 10 mg by mouth daily as needed for allergies. , Disp: , Rfl:    doxazosin (CARDURA XL) 4 MG 24 hr tablet, Take 1 tablet (4 mg total) by mouth daily with breakfast., Disp: 90 tablet, Rfl: 3   DULoxetine (CYMBALTA) 60 MG capsule, Take 1 capsule (60 mg total) by mouth daily., Disp: 90 capsule, Rfl: 4   gabapentin (NEURONTIN) 300 MG capsule, Take 2 capsules (600 mg total) by mouth at bedtime., Disp: 180 capsule, Rfl: 4    Multiple Vitamin (MULTIVITAMIN) tablet, Take 1 tablet by mouth daily., Disp: , Rfl:    Naproxen Sodium (ALEVE PO), Take 1-2 tablets by mouth 2 (two) times daily as needed. As needed , Disp: , Rfl:    omeprazole (PRILOSEC) 40 MG capsule, TAKE 1 CAPSULE DAILY, Disp: 90 capsule, Rfl: 3   pravastatin (PRAVACHOL) 80 MG tablet, Take 80 mg by mouth daily., Disp: , Rfl:   No orders of the defined types were placed in this encounter.  There are no Patient Instructions on file for this visit.   --Continue cardiac medications as reconciled in final medication list. --Return in about 6 months (around 01/06/2022), or if symptoms worsen or fail to improve. Has an appt to see Dr. Ezra Sites 08/24/2021.  --Continue follow-up with your primary care physician regarding the management of your other chronic comorbid conditions.  Patient's questions and concerns were addressed to his satisfaction. He voices understanding of the instructions provided during this encounter.   This note was created using a voice recognition software as a result there may be grammatical errors inadvertently enclosed that do not reflect the nature of this encounter. Every attempt is made to correct such errors.  Rex Kras, [place], [facility]  Pager: [phone] Office: [phone]

## 2021-08-24 ENCOUNTER — Encounter: Payer: Self-pay | Admitting: Cardiology

## 2021-08-24 ENCOUNTER — Other Ambulatory Visit: Payer: Self-pay

## 2021-08-24 ENCOUNTER — Ambulatory Visit (INDEPENDENT_AMBULATORY_CARE_PROVIDER_SITE_OTHER): Payer: BC Managed Care – PPO | Admitting: Cardiology

## 2021-08-24 DIAGNOSIS — I10 Essential (primary) hypertension: Secondary | ICD-10-CM | POA: Diagnosis not present

## 2021-08-24 DIAGNOSIS — Z8249 Family history of ischemic heart disease and other diseases of the circulatory system: Secondary | ICD-10-CM | POA: Diagnosis not present

## 2021-08-24 NOTE — Patient Instructions (Signed)
Medication Instructions:  You may discontinue your pravastatin and gabapentin. Continue all other medications as listed.  *If you need a refill on your cardiac medications before your next appointment, please call your pharmacy*  Follow-Up: At [facility], you and your health needs are our priority.  As part of our continuing mission to provide you with exceptional heart care, we have created designated Provider Care Teams.  These Care Teams include your primary Cardiologist (physician) and Advanced Practice Providers (APPs -  Physician Assistants and Nurse Practitioners) who all work together to provide you with the care you need, when you need it.  We recommend signing up for the patient portal called "MyChart".  Sign up information is provided on this After Visit Summary.  MyChart is used to connect with patients for Virtual Visits (Telemedicine).  Patients are able to view lab/test results, encounter notes, upcoming appointments, etc.  Non-urgent messages can be sent to your provider as well.   To learn more about what you can do with MyChart, go to [email or web address].    Your next appointment:   1 year(s)  The format for your next appointment:   In Person  Provider:   Candee Furbish, MD   Thank you for choosing [facility]!!

## 2021-08-24 NOTE — Progress Notes (Signed)
Cardiology Office Note:    Date:  08/24/2021   ID:  [name], DOB 02-05-1961, MRN [id number]  PCP:  London Pepper, MD   [facility] Providers Cardiologist:  None     Referring MD: London Pepper, MD    History of Present Illness:    Timothy [name] is a 60 y.o. male here for the follow-up of hypertension, hyperlipidemia, and coronary artery disease.  He presented to the ED 06/01/2020 with intermittent chest pain over 2-3 days. Troponins were flat. Recommended follow-up with cardiology.  He called on 06/04/2021 reporting headaches and brain fog associated with [place] Covid. He was dx with Covid in 03/2021.  Today: Overall, he appears well. He states he is feeling significantly better.  However, since 04/2021 he continues to suffer from some brain fog, severe headaches, and fatigue. These symptoms are contributing to a lack of physical activity, and he has gained 30 lbs in the past few months.   He notes his blood pressure today is lower than he has seen at home lately.  He has purchased a Lexicographer for weight loss, but wanted to wait and discuss this in clinic today before beginning the program.  He denies any palpitations, chest pain, or shortness of breath. No lightheadedness, syncope, orthopnea, or PND. Also has no lower extremity edema.   Past Medical History:  Diagnosis Date   Allergy    Asthma    Barrett's esophagus    BPH (benign prostatic hyperplasia)    Fibromyalgia    GERD (gastroesophageal reflux disease)    History of hiatal hernia    History of kidney stones    Hyperlipidemia    Hypertension    Kidney stones    Peripheral neuropathy    Sleep apnea    CPAP 100%   Small fiber neuropathy     Past Surgical History:  Procedure Laterality Date   arm surgery Right 2005   KNEE SURGERY Bilateral 1976   LIPOMA EXCISION N/A 11/06/2016   Procedure: EXCISION LIPOMA ABDOMINAL WALL;  Surgeon: Clovis Riley, MD;  Location: [facility];  Service: General;  Laterality:  N/A;   NASAL SINUS SURGERY  2001   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   bisoprolol-hydrochlorothiazide (ZIAC) 5-6.25 MG tablet TAKE 1 TABLET DAILY. Please make overdue appt with Dr. Marlou Porch before anymore refills. 2nd attempt   cetirizine (ZYRTEC) 10 MG tablet Take 10 mg by mouth daily as needed for allergies.    doxazosin (CARDURA XL) 4 MG 24 hr tablet Take 1 tablet (4 mg total) by mouth daily with breakfast.   DULoxetine (CYMBALTA) 60 MG capsule Take 1 capsule (60 mg total) by mouth daily.   Multiple Vitamin (MULTIVITAMIN) tablet Take 1 tablet by mouth daily.   Naproxen Sodium (ALEVE PO) Take 1-2 tablets by mouth 2 (two) times daily as needed. As needed    omeprazole (PRILOSEC) 40 MG capsule TAKE 1 CAPSULE DAILY   [DISCONTINUED] gabapentin (NEURONTIN) 300 MG capsule Take 2 capsules (600 mg total) by mouth at bedtime.   [DISCONTINUED] pravastatin (PRAVACHOL) 80 MG tablet Take 80 mg by mouth daily.     Allergies:   Patient has no known allergies.   Social History   Socioeconomic History   Marital status: Married    Spouse name: Angelita Ingles   Number of children: 2   Years  of education: 16   Highest education level: Not on file  Occupational History   Occupation: production management    Comment: [place]  Tobacco Use   Smoking status: Never   Smokeless tobacco: Never  Vaping Use   Vaping Use: Never used  Substance and Sexual Activity   Alcohol use: Yes    Alcohol/week: 0.0 standard drinks    Comment: 2 per month   Drug use: No   Sexual activity: Not on file  Other Topics Concern   Not on file  Social History Narrative   Lives with wife   Caffeine use- coffee 2 cups daily   Social Determinants of Health   Financial Resource Strain: Not on file  Food Insecurity: Not on file  Transportation Needs: Not on file  Physical Activity: Not on file   Stress: Not on file  Social Connections: Not on file     Family History: The patient's family history includes Chiari malformation in his daughter; Diabetes in his father; Heart disease in his father, mother, paternal grandfather, and paternal grandmother; Other in his daughter; Skin cancer in his father; Valvular heart disease in his brother. There is no history of Colon cancer, Stomach cancer, Rectal cancer, or Esophageal cancer.  ROS:   Please see the history of present illness.    (+) Brain fog (+) Headaches (+) Fatigue (+) Weight gain All other systems reviewed and are negative.  EKGs/Labs/Other Studies Reviewed:    The following studies were reviewed today:  Echocardiogram 06/11/2021:  Study Quality: Technically difficult study.  Normal LV systolic function with visual EF 55-60%. Left ventricle cavity  is normal in size. Normal global wall motion. Normal diastolic filling  pattern, normal LAP.  Mild tricuspid regurgitation. No evidence of pulmonary hypertension.  Compared to study 07/15/2014 no significant change.   Exercise Sestamibi stress test 06/11/2021: 1 Day Rest/Stress Protocol. Functional status: Fair. Chest pain: None Reason for stopping exercise: Fatigue and weakness. Hypertensive response to exercise: Yes. Exercise time 6 minutes 59 seconds on Bruce protocol, achieved 8.55 METS, and 86% of age-predicted maximum heart rate. Stress ECG negative for ischemia. Overall normal myocardial perfusion with soft tissue attenuation artifact, without convincing evidence of reversible myocardial ischemia or prior infarct. Calculated LVEF 68%, left ventricular wall thickness is preserved without regional wall motion abnormalities. Low risk study.  Coronary CT W/Calcium Score 11/10/2018: FINDINGS: A 120 kV prospective scan was triggered in the descending thoracic aorta at 111 HU's. Axial non-contrast 3 mm slices were carried out through the heart. The data set was  analyzed on a dedicated work station and scored using the Ness. Gantry rotation speed was 250 msecs and collimation was .6 mm. No beta blockade and 0.8 mg of sl NTG was given. The 3D data set was reconstructed in 5% intervals of the 67-82 % of the R-R cycle. Diastolic phases were analyzed on a dedicated work station using MPR, MIP and VRT modes. The patient received 80 cc of contrast.   Aorta:  Normal size.  No calcifications.  No dissection.   Aortic Valve:  Trileaflet.  No calcifications.   Coronary Arteries:  Normal coronary origin.  Right dominance.   RCA is a large dominant artery that gives rise to PDA and PLVB. There is no plaque.   Left main is a large artery that gives rise to LAD and LCX arteries. Left main has no plaque.   LAD is a large vessel that gives rise to two diagonal arteries and has  minimal irregularities.   LCX is a non-dominant artery that gives rise to one large OM1 branch. There are minimal irregularities.   Other findings:   Normal pulmonary vein drainage into the left atrium.   Normal let atrial appendage without a thrombus.   Normal size of the pulmonary artery.   IMPRESSION: 1. Coronary calcium score of 1.5. This was 102 percentile for age and sex matched control.   2. Normal coronary origin with right dominance.   3. Minimal non-obstructive CAD. Consider non-cardiac causes of chest pain.  EKG:  EKG is personally reviewed and interpreted. 08/24/2021: EKG was not ordered.  Recent Labs: No results found for requested labs within last 8760 hours.   Recent Lipid Panel No results found for: CHOL, TRIG, HDL, CHOLHDL, VLDL, LDLCALC, LDLDIRECT   Risk Assessment/Calculations:          Physical Exam:    VS:  BP (!) 100/40 (BP Location: Left Arm, Patient Position: Standing, Cuff Size: Normal)   Pulse 62   Ht 5' 10"  (1.778 m)   Wt 282 lb (127.9 kg)   SpO2 96%   BMI 40.46 kg/m     Wt Readings from Last 3 Encounters:   08/24/21 282 lb (127.9 kg)  07/06/21 280 lb (127 kg)  06/20/21 275 lb (124.7 kg)    GEN: Well nourished, well developed in no acute distress HEENT: Normal NECK: No JVD; No carotid bruits LYMPHATICS: No lymphadenopathy CARDIAC: RRR, no murmurs, rubs, gallops RESPIRATORY:  Clear to auscultation without rales, wheezing or rhonchi  ABDOMEN: Soft, non-tender, non-distended MUSCULOSKELETAL:  No edema; No deformity  SKIN: Warm and dry NEUROLOGIC:  Alert and oriented x 3 PSYCHIATRIC:  Normal affect   ASSESSMENT:    1. Family history of coronary artery disease in brother   2. Primary hypertension    PLAN:    In order of problems listed above: Family history of coronary artery disease in brother Recent echocardiogram and nuclear stress test performed at [facility] cardiovascular reassuring.  Excellent.  Dyspnea on exertion has improved.  Small amount of coronary calcium 1.5 noted.  He was on Pravachol.  He would like to try to come off of this medication to see if this makes him feel better from a brain fog perspective.  I am willing to give him a chance at this.  We are stopping the pravastatin 80 mg as well as the gabapentin 600 mg with slight taper.  He was taking the gabapentin for minor neuropathy.  He appreciates this.  HTN (hypertension) Excellent blood pressure control.  No changes.  Obesity-  Has Golo supplement.  We discussed dietary modifications today, whole foods, vegetables, fruits.  Daily exercise.   Follow-up: 1 year.  Medication Adjustments/Labs and Tests Ordered: Current medicines are reviewed at length with the patient today.  Concerns regarding medicines are outlined above.   No orders of the defined types were placed in this encounter.  No orders of the defined types were placed in this encounter.  Patient Instructions  Medication Instructions:  You may discontinue your pravastatin and gabapentin. Continue all other medications as listed.  *If you need a  refill on your cardiac medications before your next appointment, please call your pharmacy*  Follow-Up: At [facility], you and your health needs are our priority.  As part of our continuing mission to provide you with exceptional heart care, we have created designated Provider Care Teams.  These Care Teams include your primary Cardiologist (physician) and Advanced Practice Providers (APPs -  Physician Assistants and Nurse Practitioners) who all work together to provide you with the care you need, when you need it.  We recommend signing up for the patient portal called "MyChart".  Sign up information is provided on this After Visit Summary.  MyChart is used to connect with patients for Virtual Visits (Telemedicine).  Patients are able to view lab/test results, encounter notes, upcoming appointments, etc.  Non-urgent messages can be sent to your provider as well.   To learn more about what you can do with MyChart, go to [email or web address].    Your next appointment:   1 year(s)  The format for your next appointment:   In Person  Provider:   Candee Furbish, MD   Thank you for choosing [facility]!!     I,Mathew Stumpf,acting as a scribe for Candee Furbish, MD.,have documented all relevant documentation on the behalf of Candee Furbish, MD,as directed by  Candee Furbish, MD while in the presence of Candee Furbish, MD.  I, Candee Furbish, MD, have reviewed all documentation for this visit. The documentation on 08/24/21 for the exam, diagnosis, procedures, and orders are all accurate and complete.    Signed, Candee Furbish, MD  08/24/2021 3:23 PM    [facility]

## 2021-08-24 NOTE — Assessment & Plan Note (Signed)
Excellent blood pressure control.  No changes.

## 2021-08-24 NOTE — Assessment & Plan Note (Signed)
Recent echocardiogram and nuclear stress test performed at [facility] cardiovascular reassuring.  Excellent.  Dyspnea on exertion has improved.  Small amount of coronary calcium 1.5 noted.  He was on Pravachol.  He would like to try to come off of this medication to see if this makes him feel better from a brain fog perspective.  I am willing to give him a chance at this.  We are stopping the pravastatin 80 mg as well as the gabapentin 600 mg with slight taper.  He was taking the gabapentin for minor neuropathy.  He appreciates this.

## 2021-08-24 NOTE — Assessment & Plan Note (Signed)
Has Golo supplement.  We discussed dietary modifications today, whole foods, vegetables, fruits.  Daily exercise.

## 2021-09-13 ENCOUNTER — Ambulatory Visit: Payer: BC Managed Care – PPO | Admitting: Family Medicine

## 2021-10-04 DIAGNOSIS — G4733 Obstructive sleep apnea (adult) (pediatric): Secondary | ICD-10-CM | POA: Diagnosis not present

## 2021-11-03 DIAGNOSIS — G4733 Obstructive sleep apnea (adult) (pediatric): Secondary | ICD-10-CM | POA: Diagnosis not present

## 2021-11-20 DIAGNOSIS — N401 Enlarged prostate with lower urinary tract symptoms: Secondary | ICD-10-CM | POA: Diagnosis not present

## 2021-11-20 DIAGNOSIS — E7849 Other hyperlipidemia: Secondary | ICD-10-CM | POA: Diagnosis not present

## 2021-11-20 DIAGNOSIS — R3129 Other microscopic hematuria: Secondary | ICD-10-CM | POA: Diagnosis not present

## 2021-11-20 DIAGNOSIS — R3912 Poor urinary stream: Secondary | ICD-10-CM | POA: Diagnosis not present

## 2021-12-04 DIAGNOSIS — G4733 Obstructive sleep apnea (adult) (pediatric): Secondary | ICD-10-CM | POA: Diagnosis not present

## 2022-03-01 ENCOUNTER — Telehealth: Payer: Self-pay | Admitting: Diagnostic Neuroimaging

## 2022-03-01 NOTE — Telephone Encounter (Signed)
[facility] Timothy [name]) checking on refill request faxed on  02/25/22 for Gabapentin 300 mg. Would like a call from the nurse ?Reference no: [id number] ?

## 2022-03-04 NOTE — Telephone Encounter (Signed)
Per Dr. Marlou Porch office note on 08/24/2021 pt was being tapered off this med. Refill not needed.  ?

## 2022-04-23 ENCOUNTER — Other Ambulatory Visit: Payer: Self-pay | Admitting: Diagnostic Neuroimaging

## 2022-04-23 DIAGNOSIS — G6289 Other specified polyneuropathies: Secondary | ICD-10-CM

## 2022-04-26 DIAGNOSIS — S20461A Insect bite (nonvenomous) of right back wall of thorax, initial encounter: Secondary | ICD-10-CM | POA: Diagnosis not present

## 2022-04-26 DIAGNOSIS — W57XXXA Bitten or stung by nonvenomous insect and other nonvenomous arthropods, initial encounter: Secondary | ICD-10-CM | POA: Diagnosis not present

## 2022-06-24 ENCOUNTER — Ambulatory Visit: Payer: BC Managed Care – PPO | Admitting: Family Medicine

## 2022-06-24 DIAGNOSIS — L609 Nail disorder, unspecified: Secondary | ICD-10-CM | POA: Diagnosis not present

## 2022-06-24 DIAGNOSIS — R7303 Prediabetes: Secondary | ICD-10-CM | POA: Diagnosis not present

## 2022-06-24 DIAGNOSIS — Z6841 Body Mass Index (BMI) 40.0 and over, adult: Secondary | ICD-10-CM | POA: Diagnosis not present

## 2022-06-24 DIAGNOSIS — E785 Hyperlipidemia, unspecified: Secondary | ICD-10-CM | POA: Diagnosis not present

## 2022-06-24 NOTE — Patient Instructions (Signed)
Below is our plan:  We will continue duloxetine 60mg  daily and ALA 600mg  daily.   Please make sure you are staying well hydrated. I recommend 50-60 ounces daily. Well balanced diet and regular exercise encouraged. Consistent sleep schedule with 6-8 hours recommended.   Please continue follow up with care team as directed.   Follow up with me in 1 year   You may receive a survey regarding today's visit. I encourage you to leave honest feed back as I do use this information to improve patient care. Thank you for seeing me today!

## 2022-06-24 NOTE — Progress Notes (Unsigned)
PATIENT: Timothy [name] DOB: 1961/06/04  REASON FOR VISIT: follow up HISTORY FROM: patient  No chief complaint on file.    HISTORY OF PRESENT ILLNESS:  06/24/22 ALL: Timothy [name] returns for follow up for neuropathy. He continues gabapentin and duloxetine. ALA? Headaches?   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  May 2022 patient had COVID with malaise, fatigue, cough, fevers.  He was prescribed paxlovid and had some improvement.  However after few days symptoms returned.  This time he had more severe COVID symptoms.  Also had headaches, brain fog and fatigue.   Patient having 1-2 headaches per day lasting 1 hour at a time.  Headaches occurring on a daily basis.  Also having significant fatigue and brain fog.  Fortunately his symptoms are gradually proving over time.  He is using Tylenol with mild relief.  He is sleeping well using CPAP every night.  05/22/2020 ALL: Timothy [name] is a 61 y.o. male here today for follow up for neuropathy. We increased duloxetine dose to '60mg'$  daily in 10/2019. He does feel that this has helped. He has continued gabapentin '600mg'$  at bedtime. Numbness and tingling is still present. It is fairly constant but manageable. He travels for work and may be walking for 12-14 hours. These days are worse. He is no longer wearing a carpal tunnel brace as symptoms have resolved. He is followed regularly by PCP. DM is well controlled. He is feeling well today and without complaints.   HISTORY: (copied from my note on 11/23/2019)  Timothy [name] is a 61 y.o. male here today for follow up for neuropathy. He continues gabapentin '600mg'$  at night and duloxetine '30mg'$  daily. He feels that lower extremity numbness and tingling is progressing. Worse on left. Mostly involves bid toe and ball of foot. He does feel that symptoms improved initially with duloxetine. Over the past two months symptoms have worsened. He has also noted left thumb numbness, specifically  when he wakes in the mornings. 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 He has a history of prediabetes. Last A1C was 6.    HISTORY: (copied from my note on 03/30/2019)   Timothy [name] is a 60 y.o. male for follow up of neuropathy.  Timothy [name] reports that neuropathy symptoms have steadily increased over the past year.  He reports both numbness and tingling in bilateral lower extremities.  At his last visit nearly a year ago he was advised to increase gabapentin to 600 mg at night.  He reports this did help for short period of time.  He can definitely tell if he misses his dose of gabapentin.  He continues to be active.  He has not correlated any worsening or improvement with activity.  He feels that neuropathy is constant and not worse at any particular time.  He is under more stress currently as his daughter has been hospitalized.  He travels frequently and spends a lot of time out of town.  He is followed closely by his PCP who has been monitoring lab work.  He reports labs have been normal.     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  He is currently on gabapentin.  This was started about 1 year ago he says his symptoms have gone away on the right side but he still feels  numbness and tingling in his left leg and foot he is only taking gabapentin 300 at night the morning dose makes him too drowsy.  He also has a history of obesity obstructive sleep apnea using CPAP.  He also has an area on his left knee at the back which he says swells at times.  He returns for reevaluation     UPDATE 4/26/2019CM Timothy [name],, 61 year old male returns for follow-up with history of neuropathy in both feet worse over the last 6 months.  He continues to have numbness feelings in the left arm leg and foot which are  fairly constant no change in the right side.  He was just started on gabapentin 6 months ago.  He is morbidly obese and have obstructive sleep apnea using CPAP.  His CPAP is followed by primary care.  He continues to work full-time and travels for his job.  He returns for reevaluation     UPDATE 10/25/2018CM Timothy [name], 61 year old male returns for follow-up for small fiber neuropathy.he has begun having daily symptoms of neuropathy in the left foot and lack that has been progressively getting worse over the last year.He has never been on any medication for this.He feels he cannot be as active as previously due to burning pain in the feet. When his activity decreased his weight increased. He was recently diagnosed with hiatal hernia and Barrett's esophagitis.He has a history of obstructive sleep apnea and uses CPAP.He returns for reevaluation     04/12/16 VP24 year old right-handed male with hypertension, hypercholesteremia, sleep apnea, kidney stones, here for evaluation of back pain and numbness in the feet. Symptoms present for at least 6 months.   For past 6 months has had midline low back pain. Symptoms worse when he sleeps for a long time. Worsened early morning. Patient improved after activity. No radiating symptoms. No specific triggering factors. No history of severe accident or trauma.   Around the same time patient has also noted numbness and tingling in the balls of the feet, left worse than right side. Symptoms seem to be independent from back pain problems. Patient has had lab screening testing with B12, TSH, A1c which were unremarkable.     REVIEW OF SYSTEMS: Out of a complete 14 system review of symptoms, the patient complains only of the following symptoms, numbness of feet, and all other reviewed systems are negative.  ALLERGIES: No Known Allergies  HOME MEDICATIONS: Outpatient Medications Prior to Visit  Medication Sig Dispense Refill   aspirin EC 81 MG tablet Take 81 mg by  mouth daily.     bisoprolol-hydrochlorothiazide (ZIAC) 5-6.25 MG tablet TAKE 1 TABLET DAILY. Please make overdue appt with Dr. Marlou Porch before anymore refills. 2nd attempt 15 tablet 0   cetirizine (ZYRTEC) 10 MG tablet Take 10 mg by mouth daily as needed for allergies.      doxazosin (CARDURA XL) 4 MG 24 hr tablet Take 1 tablet (4 mg total) by mouth daily with breakfast. 90 tablet 3   DULoxetine (CYMBALTA) 60 MG capsule Take 1 capsule (60 mg total) by mouth daily. 90 capsule 4   Multiple Vitamin (MULTIVITAMIN) tablet Take 1 tablet by mouth daily.     Naproxen Sodium (ALEVE PO) Take 1-2 tablets by mouth 2 (two) times daily as needed. As needed      omeprazole (PRILOSEC) 40 MG capsule TAKE 1 CAPSULE DAILY 90 capsule 3   No facility-administered medications prior to visit.    PAST MEDICAL HISTORY: Past Medical History:  Diagnosis Date  Allergy    Asthma    Barrett's esophagus    BPH (benign prostatic hyperplasia)    Fibromyalgia    GERD (gastroesophageal reflux disease)    History of hiatal hernia    History of kidney stones    Hyperlipidemia    Hypertension    Kidney stones    Peripheral neuropathy    Sleep apnea    CPAP 100%   Small fiber neuropathy     PAST SURGICAL HISTORY: Past Surgical History:  Procedure Laterality Date   arm surgery Right 2005   KNEE SURGERY Bilateral 1976   LIPOMA EXCISION N/A 11/06/2016   Procedure: EXCISION LIPOMA ABDOMINAL WALL;  Surgeon: Clovis Riley, MD;  Location: [facility];  Service: General;  Laterality: N/A;   NASAL SINUS SURGERY  2001   tumor 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 Right    2001-2002    FAMILY HISTORY: Family History  Problem Relation Age of Onset   Heart disease Mother    Heart disease Father    Diabetes Father    Skin cancer Father    Valvular heart disease Brother    Heart disease Paternal Grandmother    Heart disease Paternal Grandfather    Chiari malformation Daughter    Other Daughter        Noonan syndrome   Colon  cancer Neg Hx    Stomach cancer Neg Hx    Rectal cancer Neg Hx    Esophageal cancer Neg Hx     SOCIAL HISTORY: Social History   Socioeconomic History   Marital status: Married    Spouse name: Angelita Ingles   Number of children: 2   Years of education: 16   Highest education level: Not on file  Occupational History   Occupation: Actuary    Comment: [employer] [place]  Tobacco Use   Smoking status: Never   Smokeless tobacco: Never  Vaping Use   Vaping Use: Never used  Substance and Sexual Activity   Alcohol use: Yes    Alcohol/week: 0.0 standard drinks of alcohol    Comment: 2 per month   Drug use: No   Sexual activity: Not on file  Other Topics Concern   Not on file  Social History Narrative   Lives with wife   Caffeine use- coffee 2 cups daily   Social Determinants of Health   Financial Resource Strain: Not on file  Food Insecurity: Not on file  Transportation Needs: Not on file  Physical Activity: Not on file  Stress: Not on file  Social Connections: Not on file  Intimate Partner Violence: Not on file      PHYSICAL EXAM  There were no vitals filed for this visit.  There is no height or weight on file to calculate BMI.  Generalized: Well developed, in no acute distress  Cardiology: normal rate and rhythm, no murmur noted Respiratory: clear to auscultation bilaterally  Neurological examination  Mentation: Alert oriented to time, place, history taking. Follows all commands speech and language fluent Cranial nerve II-XII: Pupils were equal round reactive to light. Extraocular movements were full, visual field were full  Motor: The motor testing reveals 5 over 5 strength of all 4 extremities. Good symmetric motor tone is noted throughout.  Sensory: Sensory testing is intact to soft touch on all 4 extremities. No evidence of extinction is noted.   Gait and station: Gait is normal.   DIAGNOSTIC DATA (LABS, IMAGING, TESTING) - I reviewed patient records,  labs, notes, testing and imaging  myself where available.      No data to display           Lab Results  Component Value Date   WBC 6.7 06/01/2020   HGB 13.4 06/01/2020   HCT 41.0 06/01/2020   MCV 82.7 06/01/2020   PLT 222 06/01/2020      Component Value Date/Time   NA 140 06/01/2020 1553   NA 142 11/09/2018 1013   K 4.0 06/01/2020 1553   CL 106 06/01/2020 1553   CO2 23 06/01/2020 1553   GLUCOSE 143 (H) 06/01/2020 1553   BUN 14 06/01/2020 1553   BUN 13 11/09/2018 1013   CREATININE 0.93 06/01/2020 1553   CALCIUM 8.9 06/01/2020 1553   GFRNONAA >60 06/01/2020 1553   GFRAA >60 06/01/2020 1553   No results found for: "CHOL", "HDL", "LDLCALC", "LDLDIRECT", "TRIG", "CHOLHDL" No results found for: "HGBA1C" No results found for: "VITAMINB12" No results found for: "TSH"     ASSESSMENT AND PLAN 61 y.o. year old male  has a past medical history of Allergy, Asthma, Barrett's esophagus, BPH (benign prostatic hyperplasia), Fibromyalgia, GERD (gastroesophageal reflux disease), History of hiatal hernia, History of kidney stones, Hyperlipidemia, Hypertension, Kidney stones, Peripheral neuropathy, Sleep apnea, and Small fiber neuropathy. here with   No diagnosis found.   Stran is doing well today.  We will continue duloxetine 60 mg and gabapentin 600 mg daily.  He was advised that he may use alpha lipoic acid over-the-counter for additional neuropathy support if beneficial.  Carpal tunnel symptoms have resolved.  He may return to using wrist splint if needed.  We have also discussed trying Voltaren over-the-counter.  He was advised to avoid regular use of NSAIDs.  He will continue close follow-up with primary care for management of comorbidities.  Regular exercise and well-balanced diet advised.  He will follow-up with me in 1 year, sooner if needed.  He verbalizes understanding and agreement with this plan.  No orders of the defined types were placed in this encounter.    No orders of  the defined types were placed in this encounter.      Debbora Presto, FNP-C 06/24/2022, 4:51 PM [facility] [address], [place], [place] [address] [phone]

## 2022-06-25 ENCOUNTER — Ambulatory Visit (INDEPENDENT_AMBULATORY_CARE_PROVIDER_SITE_OTHER): Payer: BC Managed Care – PPO | Admitting: Family Medicine

## 2022-06-25 ENCOUNTER — Encounter: Payer: Self-pay | Admitting: Family Medicine

## 2022-06-25 VITALS — BP 132/76 | HR 62 | Ht 70.0 in | Wt 289.0 lb

## 2022-06-25 DIAGNOSIS — G6289 Other specified polyneuropathies: Secondary | ICD-10-CM

## 2022-06-25 DIAGNOSIS — R7303 Prediabetes: Secondary | ICD-10-CM | POA: Diagnosis not present

## 2022-06-25 DIAGNOSIS — D649 Anemia, unspecified: Secondary | ICD-10-CM | POA: Diagnosis not present

## 2022-06-25 DIAGNOSIS — Z125 Encounter for screening for malignant neoplasm of prostate: Secondary | ICD-10-CM | POA: Diagnosis not present

## 2022-06-25 DIAGNOSIS — E785 Hyperlipidemia, unspecified: Secondary | ICD-10-CM | POA: Diagnosis not present

## 2022-06-25 MED ORDER — DULOXETINE HCL 60 MG PO CPEP: 60.0000 mg | ORAL_CAPSULE | Freq: Every day | ORAL | 4 refills | Status: DC

## 2022-07-27 ENCOUNTER — Other Ambulatory Visit: Payer: Self-pay

## 2022-07-27 ENCOUNTER — Emergency Department (HOSPITAL_BASED_OUTPATIENT_CLINIC_OR_DEPARTMENT_OTHER)
Admission: EM | Admit: 2022-07-27 | Discharge: 2022-07-27 | Disposition: A | Discharge status: Home / Self Care | Payer: BC Managed Care – PPO | Attending: Emergency Medicine | Admitting: Emergency Medicine

## 2022-07-27 ENCOUNTER — Encounter (HOSPITAL_BASED_OUTPATIENT_CLINIC_OR_DEPARTMENT_OTHER): Payer: Self-pay | Admitting: Emergency Medicine

## 2022-07-27 DIAGNOSIS — U071 COVID-19: Secondary | ICD-10-CM | POA: Insufficient documentation

## 2022-07-27 DIAGNOSIS — R519 Headache, unspecified: Secondary | ICD-10-CM | POA: Diagnosis not present

## 2022-07-27 DIAGNOSIS — Z7982 Long term (current) use of aspirin: Secondary | ICD-10-CM | POA: Diagnosis not present

## 2022-07-27 LAB — SARS CORONAVIRUS 2 BY RT PCR: SARS Coronavirus 2 by RT PCR: POSITIVE — AB

## 2022-07-27 MED ORDER — ACETAMINOPHEN 325 MG PO TABS
650.0000 mg | ORAL_TABLET | Freq: Once | ORAL | Status: AC
  Administered 2022-07-27: 650 mg via ORAL
  Filled 2022-07-27: qty 2

## 2022-07-27 MED ORDER — MOLNUPIRAVIR EUA 200MG CAPSULE: 4.0000 | ORAL_CAPSULE | Freq: Two times a day (BID) | ORAL | 0 refills | Status: AC

## 2022-07-27 NOTE — ED Provider Notes (Signed)
[facility] EMERGENCY DEPT Provider Note   CSN: [id number] Arrival date & time: 07/27/22  1723     History  Chief Complaint  Patient presents with   Covid Positive    Timothy [name] is a 61 y.o. male.  Patient is a 61 year old male who presents with possible COVID infection.  He started having symptoms 2 days ago with runny nose congestion, sore throat and headache.  He has had some myalgias as well.  His symptoms started 2 days ago and have progressed since then.  He started having a fever today.  No vomiting.  He had a little bit of nausea and a little bit of diarrhea.  He says he has some shortness of breath during coughing spells but no other shortness of breath.  He took a home COVID test that was positive.       Home Medications Prior to Admission medications   Medication Sig Start Date End Date Taking? Authorizing Provider  molnupiravir EUA (LAGEVRIO) 200 mg CAPS capsule Take 4 capsules (800 mg total) by mouth 2 (two) times daily for 5 days. 07/27/22 08/01/22 Yes Malvin Johns, MD  aspirin EC 81 MG tablet Take 81 mg by mouth daily.    [provider]  bisoprolol-hydrochlorothiazide (ZIAC) 5-6.25 MG tablet TAKE 1 TABLET DAILY. Please make overdue appt with Dr. Marlou Porch before anymore refills. 2nd attempt 05/23/20   Jerline Pain, MD  cetirizine (ZYRTEC) 10 MG tablet Take 10 mg by mouth daily as needed for allergies.     [provider]  doxazosin (CARDURA XL) 4 MG 24 hr tablet Take 1 tablet (4 mg total) by mouth daily with breakfast. 10/02/18   Jerline Pain, MD  DULoxetine (CYMBALTA) 60 MG capsule Take 1 capsule (60 mg total) by mouth daily. 06/25/22 09/18/23  Lomax, Amy, NP  Multiple Vitamin (MULTIVITAMIN) tablet Take 1 tablet by mouth daily.    [provider]  Naproxen Sodium (ALEVE PO) Take 1-2 tablets by mouth 2 (two) times daily as needed. As needed     [provider]  omeprazole (PRILOSEC) 40 MG capsule TAKE 1 CAPSULE DAILY  10/09/20   Milus Banister, MD  pravastatin (PRAVACHOL) 80 MG tablet Take 80 mg by mouth daily. 06/18/22   [provider]      Allergies    Patient has no known allergies.    Review of Systems   Review of Systems  Constitutional:  Positive for fatigue and fever. Negative for chills and diaphoresis.  HENT:  Positive for congestion and rhinorrhea. Negative for sneezing.   Eyes: Negative.   Respiratory:  Positive for cough. Negative for chest tightness and shortness of breath.   Cardiovascular:  Negative for chest pain and leg swelling.  Gastrointestinal:  Positive for nausea. Negative for abdominal pain, blood in stool, diarrhea and vomiting.  Genitourinary:  Negative for difficulty urinating, flank pain, frequency and hematuria.  Musculoskeletal:  Positive for myalgias. Negative for arthralgias and back pain.  Skin:  Negative for rash.  Neurological:  Positive for headaches. Negative for dizziness, speech difficulty, weakness and numbness.    Physical Exam Updated Vital Signs BP (!) 159/84 (BP Location: Right Arm)   Pulse 90   Temp (!) 100.6 F (38.1 C) (Oral)   Resp 20   SpO2 97%  Physical Exam Constitutional:      Appearance: He is well-developed.  HENT:     Head: Normocephalic and atraumatic.     Nose: Nose normal.  Mouth/Throat:     Mouth: Mucous membranes are moist.     Pharynx: No oropharyngeal exudate or posterior oropharyngeal erythema.  Eyes:     Pupils: Pupils are equal, round, and reactive to light.  Neck:     Comments: No meningismus Cardiovascular:     Rate and Rhythm: Normal rate and regular rhythm.     Heart sounds: Normal heart sounds.  Pulmonary:     Effort: Pulmonary effort is normal. No respiratory distress.     Breath sounds: Normal breath sounds. No wheezing or rales.  Chest:     Chest wall: No tenderness.  Abdominal:     General: Bowel sounds are normal.     Palpations: Abdomen is soft.     Tenderness: There is no abdominal  tenderness. There is no guarding or rebound.  Musculoskeletal:        General: Normal range of motion.     Cervical back: Normal range of motion and neck supple.  Lymphadenopathy:     Cervical: No cervical adenopathy.  Skin:    General: Skin is warm and dry.     Findings: No rash.  Neurological:     Mental Status: He is alert and oriented to person, place, and time.     ED Results / Procedures / Treatments   Labs (all labs ordered are listed, but only abnormal results are displayed) Labs Reviewed  SARS CORONAVIRUS 2 BY RT PCR - Abnormal; Notable for the following components:      Result Value   SARS Coronavirus 2 by RT PCR POSITIVE (*)    All other components within normal limits    EKG None  Radiology No results found.  Procedures Procedures    Medications Ordered in ED Medications  acetaminophen (TYLENOL) tablet 650 mg (has no administration in time range)    ED Course/ Medical Decision Making/ A&P                           Medical Decision Making Risk OTC drugs.   Patient presents with viral symptoms.  His COVID test is done in the ED and is positive.  His chart was reviewed for his medications.  He has some interactions with Paxlovid.  Patient is interested in starting antiviral therapy.  We will start molnupiravir.  Symptomatic care instructions and return precautions were given.  Final Clinical Impression(s) / ED Diagnoses Final diagnoses:  COVID-19 virus infection    Rx / DC Orders ED Discharge Orders          Ordered    molnupiravir EUA (LAGEVRIO) 200 mg CAPS capsule  2 times daily        07/27/22 1926              Malvin Johns, MD 07/27/22 1928

## 2022-07-27 NOTE — ED Triage Notes (Signed)
Started with cold/flu symptoms on Thursday after travel to [place]. Taking dayquil, helps with symptoms. Fever highest T 101.9  Comes in with + home test.  Says he has had covid before and has taken meds in the past.  Last dose of dayquil 3 pm.

## 2022-08-23 ENCOUNTER — Other Ambulatory Visit: Payer: Self-pay

## 2022-08-23 ENCOUNTER — Emergency Department (HOSPITAL_BASED_OUTPATIENT_CLINIC_OR_DEPARTMENT_OTHER)
Admission: EM | Admit: 2022-08-23 | Discharge: 2022-08-24 | Disposition: A | Discharge status: Home / Self Care | Payer: BC Managed Care – PPO | Attending: Emergency Medicine | Admitting: Emergency Medicine

## 2022-08-23 ENCOUNTER — Encounter (HOSPITAL_BASED_OUTPATIENT_CLINIC_OR_DEPARTMENT_OTHER): Payer: Self-pay | Admitting: *Deleted

## 2022-08-23 DIAGNOSIS — R110 Nausea: Secondary | ICD-10-CM | POA: Diagnosis not present

## 2022-08-23 DIAGNOSIS — R109 Unspecified abdominal pain: Secondary | ICD-10-CM | POA: Insufficient documentation

## 2022-08-23 DIAGNOSIS — Z8616 Personal history of COVID-19: Secondary | ICD-10-CM | POA: Insufficient documentation

## 2022-08-23 DIAGNOSIS — K921 Melena: Secondary | ICD-10-CM | POA: Diagnosis not present

## 2022-08-23 DIAGNOSIS — Z7982 Long term (current) use of aspirin: Secondary | ICD-10-CM | POA: Insufficient documentation

## 2022-08-23 LAB — COMPREHENSIVE METABOLIC PANEL WITH GFR
ALT: 26 U/L (ref 0–44)
AST: 21 U/L (ref 15–41)
Albumin: 4.2 g/dL (ref 3.5–5.0)
Alkaline Phosphatase: 71 U/L (ref 38–126)
Anion gap: 11 (ref 5–15)
BUN: 15 mg/dL (ref 8–23)
CO2: 26 mmol/L (ref 22–32)
Calcium: 9.7 mg/dL (ref 8.9–10.3)
Chloride: 104 mmol/L (ref 98–111)
Creatinine, Ser: 0.94 mg/dL (ref 0.61–1.24)
GFR, Estimated: >60 mL/min
Glucose, Bld: 98 mg/dL (ref 70–99)
Potassium: 3.9 mmol/L (ref 3.5–5.1)
Sodium: 141 mmol/L (ref 135–145)
Total Bilirubin: 0.4 mg/dL (ref 0.3–1.2)
Total Protein: 6.8 g/dL (ref 6.5–8.1)

## 2022-08-23 LAB — CBC
HCT: 37.1 % — ABNORMAL LOW (ref 39.0–52.0)
Hemoglobin: 12.4 g/dL — ABNORMAL LOW (ref 13.0–17.0)
MCH: 26.8 pg (ref 26.0–34.0)
MCHC: 33.4 g/dL (ref 30.0–36.0)
MCV: 80.1 fL (ref 80.0–100.0)
Platelets: 238 K/uL (ref 150–400)
RBC: 4.63 MIL/uL (ref 4.22–5.81)
RDW: 14.7 % (ref 11.5–15.5)
WBC: 6.7 K/uL (ref 4.0–10.5)
nRBC: 0 % (ref 0.0–0.2)

## 2022-08-23 LAB — OCCULT BLOOD X 1 CARD TO LAB, STOOL: Fecal Occult Bld: POSITIVE — AB

## 2022-08-23 MED ORDER — ONDANSETRON HCL 4 MG/2ML IJ SOLN
4.0000 mg | Freq: Once | INTRAMUSCULAR | Status: AC | PRN
  Administered 2022-08-23: 4 mg via INTRAVENOUS
  Filled 2022-08-23: qty 2

## 2022-08-23 MED ORDER — PANTOPRAZOLE SODIUM 40 MG PO TBEC
40.0000 mg | DELAYED_RELEASE_TABLET | Freq: Once | ORAL | Status: AC
  Administered 2022-08-23: 40 mg via ORAL
  Filled 2022-08-23: qty 1

## 2022-08-23 NOTE — ED Provider Notes (Signed)
[facility] EMERGENCY DEPT Provider Note   CSN: [id number] Arrival date & time: 08/23/22  1758     History  Chief Complaint  Patient presents with   Melena    Timothy [name] is a 61 y.o. male.  HPI 61 year old male presents with black stools.  Sent here by his PCP.  He has been taking Tylenol, ibuprofen, and Aleve for the last several weeks due to COVID and then headaches lasting after his COVID infection.  For 1 week he has been having the black stools.  No bright red blood.  Yesterday when he was in the airport he vomited a couple times though it was mostly just liquid without blood or black.  He has had a little bit of abdominal cramping since the nausea but no real pain.  No chest pain or shortness of breath.  Home Medications Prior to Admission medications   Medication Sig Start Date End Date Taking? Authorizing Provider  aspirin EC 81 MG tablet Take 81 mg by mouth daily.    [provider]  bisoprolol-hydrochlorothiazide (ZIAC) 5-6.25 MG tablet TAKE 1 TABLET DAILY. Please make overdue appt with Dr. Marlou Porch before anymore refills. 2nd attempt 05/23/20   Jerline Pain, MD  cetirizine (ZYRTEC) 10 MG tablet Take 10 mg by mouth daily as needed for allergies.     [provider]  doxazosin (CARDURA XL) 4 MG 24 hr tablet Take 1 tablet (4 mg total) by mouth daily with breakfast. 10/02/18   Jerline Pain, MD  DULoxetine (CYMBALTA) 60 MG capsule Take 1 capsule (60 mg total) by mouth daily. 06/25/22 09/18/23  Lomax, Amy, NP  Multiple Vitamin (MULTIVITAMIN) tablet Take 1 tablet by mouth daily.    [provider]  Naproxen Sodium (ALEVE PO) Take 1-2 tablets by mouth 2 (two) times daily as needed. As needed     [provider]  omeprazole (PRILOSEC) 40 MG capsule TAKE 1 CAPSULE DAILY 10/09/20   Milus Banister, MD  pravastatin (PRAVACHOL) 80 MG tablet Take 80 mg by mouth daily. 06/18/22   [provider]      Allergies    Patient has no  known allergies.    Review of Systems   Review of Systems  Respiratory:  Negative for shortness of breath.   Cardiovascular:  Negative for chest pain.  Gastrointestinal:  Positive for nausea and vomiting. Negative for abdominal pain.    Physical Exam Updated Vital Signs BP (!) 141/57   Pulse 66   Temp 98 F (36.7 C) (Oral)   Resp 18   Ht '5\' 10"'$  (1.778 m)   Wt 127 kg   SpO2 99%   BMI 40.18 kg/m  Physical Exam Vitals and nursing note reviewed.  Constitutional:      Appearance: He is well-developed. He is obese.  HENT:     Head: Normocephalic and atraumatic.  Cardiovascular:     Rate and Rhythm: Normal rate and regular rhythm.     Heart sounds: Normal heart sounds.  Pulmonary:     Effort: Pulmonary effort is normal.     Breath sounds: Normal breath sounds.  Abdominal:     General: There is no distension.     Palpations: Abdomen is soft.     Tenderness: There is no abdominal tenderness.  Genitourinary:    Rectum: No external hemorrhoid.     Comments: Small amount of stool on DRE. No gross blood. No frank melena. Skin:    General: Skin is warm and dry.  Neurological:     Mental Status: He is alert.     ED Results / Procedures / Treatments   Labs (all labs ordered are listed, but only abnormal results are displayed) Labs Reviewed  CBC - Abnormal; Notable for the following components:      Result Value   Hemoglobin 12.4 (*)    HCT 37.1 (*)    All other components within normal limits  OCCULT BLOOD X 1 CARD TO LAB, STOOL - Abnormal; Notable for the following components:   Fecal Occult Bld POSITIVE (*)    All other components within normal limits  COMPREHENSIVE METABOLIC PANEL    EKG None  Radiology No results found.  Procedures Procedures    Medications Ordered in ED Medications  ondansetron (ZOFRAN) injection 4 mg (has no administration in time range)  pantoprazole (PROTONIX) EC tablet 40 mg (has no administration in time range)    ED Course/  Medical Decision Making/ A&P                           Medical Decision Making Amount and/or Complexity of Data Reviewed External Data Reviewed: notes.    Details: EGD from 5 years ago showed gastritis.  Performed by Dr. Ardis Hughs. Labs: ordered.    Details: Hemoglobin of 12.4 is one-point lower than the most recent from 2 years ago.  CMP unremarkable.  Occult blood test is positive.  Risk Prescription drug management.   Patient is well-appearing.  He has mild hypertension but no hypotension or instability on exam.  Hemoglobin is slightly low but not concerning at 12.4.  Abdominal exam is benign.  My suspicion is this is related to his NSAID use.  Given that he is having melena this makes a little more concerning though besides a baby aspirin is not on any type of anticoagulation.  Due to this discrepancy of having melena but also being quite well-appearing and his symptoms having occurred for 1 week, I discussed case with Dr. Bryan Lemma of [facility].  We decided to stop NSAIDs, treat with PPI twice daily and he will arrange for office to call patient on Monday (today is Friday) to set up outpatient follow-up and repeat lab testing.  While he does appear well now, I did discuss strict return precautions and for patient to have a low threshold to return.  He understands this and agrees with plan. Stable for discharge.        Final Clinical Impression(s) / ED Diagnoses Final diagnoses:  Melena    Rx / DC Orders ED Discharge Orders     None         Sherwood Gambler, MD 08/23/22 2337

## 2022-08-23 NOTE — Discharge Instructions (Addendum)
We are concerned you might be having upper abdominal bleeding/stomach bleeding.  You need to increase your Prilosec from once a day to twice a day, 40 mg each.  Avoid all NSAIDs such as ibuprofen, Advil, Aleve, naproxen, etc.  Tylenol is okay to take. The Gastroenterology team will call you on 10/2 to set up outpatient testing/follow up.  However, these can become serious and so if you develop new or worsening abdominal pain, an increase in the amount of black stools, vomiting blood, dizziness, lightheadedness, weakness/fatigue, or any other new/concerning symptoms then return to the ER or call 911.

## 2022-08-23 NOTE — ED Notes (Signed)
ED Provider at bedside. 

## 2022-08-23 NOTE — ED Triage Notes (Signed)
Pt with recent covid dx who continued to have headaches which caused him to take tylenol and ibuprofen (not over daily limits) and he noticed last Saturday that his stools were dark and it has gotten worse and now he has been having black tarry stool.  Pt was sent here by PCP.

## 2022-08-23 NOTE — ED Notes (Signed)
Pt a&ox4 lying in bed - no acute distress noted.  Pt denies active abd pain or active nausea- reports only HA, behind eyes, rated 6/10 described as pressure-- pt reports persistent HA which he had been taking Aleve and Ibuprofen for --pt states he wasn't aware of risks of taking both NSAIDS on daily basis -- also reports use of tylenol.

## 2022-08-24 NOTE — ED Notes (Signed)
Pt agreeable with d/c plan as discussed by provider- this nurse has verbally reinforced d/c instructions and provided pt with written copy - pt acknowledges verbal understanding and denies any addl questions concerns needs- pt ambulatory independently at d/c with steady gait; vitals stable; no acute distress.

## 2022-08-26 ENCOUNTER — Other Ambulatory Visit: Payer: Self-pay

## 2022-08-26 DIAGNOSIS — K921 Melena: Secondary | ICD-10-CM

## 2022-08-27 ENCOUNTER — Telehealth: Payer: Self-pay

## 2022-08-27 NOTE — Telephone Encounter (Signed)
Spoke with pt and pt reports that symptoms have improved. Pt states that his stool is not as dark and nausea has also improved. Lab order placed and pt stated he would be able to come in for lab tomorrow.

## 2022-08-27 NOTE — Telephone Encounter (Signed)
-----   Message from [place], DO sent at 08/23/2022 11:25 PM EDT ----- I was contacted by the ER about this patient Friday night. Went to ER with melena after ibuprofen for Covid. Hgb down 1 gm from baseline, but stable, so they are sending him home with high dose PPI. He is a [facility] patient, so we will track him as outpatient. Please plan for the following:  - Call Monday AM to touch base w/ patient and see how he is feeling - Repeat CBC Tuesday  - Patient instructed by ER to return if sxs worsen/return  Thank you!

## 2022-08-28 ENCOUNTER — Other Ambulatory Visit (INDEPENDENT_AMBULATORY_CARE_PROVIDER_SITE_OTHER): Payer: BC Managed Care – PPO

## 2022-08-28 DIAGNOSIS — K921 Melena: Secondary | ICD-10-CM

## 2022-08-28 LAB — CBC
HCT: 35.3 % — ABNORMAL LOW (ref 39.0–52.0)
Hemoglobin: 11.9 g/dL — ABNORMAL LOW (ref 13.0–17.0)
MCHC: 33.7 g/dL (ref 30.0–36.0)
MCV: 80.5 fl (ref 78.0–100.0)
Platelets: 250 10*3/uL (ref 150.0–400.0)
RBC: 4.38 Mil/uL (ref 4.22–5.81)
RDW: 14.9 % (ref 11.5–15.5)
WBC: 7.1 10*3/uL (ref 4.0–10.5)

## 2022-08-30 NOTE — Telephone Encounter (Signed)
Patient called states he would like to know what the next step is after his lab he did on the 10/4. Requesting a call back [phone] call to advise.

## 2022-08-30 NOTE — Telephone Encounter (Signed)
Spoke with pt. See CBC lab result note.

## 2022-09-02 ENCOUNTER — Other Ambulatory Visit: Payer: Self-pay

## 2022-09-02 MED ORDER — ONDANSETRON 4 MG PO TBDP: 4.0000 mg | ORAL_TABLET | Freq: Four times a day (QID) | ORAL | 1 refills | Status: DC | PRN

## 2022-09-03 ENCOUNTER — Ambulatory Visit: Payer: BC Managed Care – PPO | Admitting: Family Medicine

## 2022-09-05 ENCOUNTER — Other Ambulatory Visit (INDEPENDENT_AMBULATORY_CARE_PROVIDER_SITE_OTHER): Payer: BC Managed Care – PPO

## 2022-09-05 ENCOUNTER — Ambulatory Visit (INDEPENDENT_AMBULATORY_CARE_PROVIDER_SITE_OTHER): Payer: BC Managed Care – PPO | Admitting: Physician Assistant

## 2022-09-05 ENCOUNTER — Encounter: Payer: Self-pay | Admitting: Physician Assistant

## 2022-09-05 VITALS — BP 122/62 | HR 58 | Ht 70.0 in | Wt 280.0 lb

## 2022-09-05 DIAGNOSIS — K921 Melena: Secondary | ICD-10-CM

## 2022-09-05 DIAGNOSIS — D649 Anemia, unspecified: Secondary | ICD-10-CM

## 2022-09-05 DIAGNOSIS — Z8719 Personal history of other diseases of the digestive system: Secondary | ICD-10-CM

## 2022-09-05 DIAGNOSIS — R110 Nausea: Secondary | ICD-10-CM | POA: Diagnosis not present

## 2022-09-05 DIAGNOSIS — D62 Acute posthemorrhagic anemia: Secondary | ICD-10-CM

## 2022-09-05 DIAGNOSIS — R195 Other fecal abnormalities: Secondary | ICD-10-CM

## 2022-09-05 LAB — CBC WITH DIFFERENTIAL/PLATELET
Basophils Absolute: 0.1 10*3/uL (ref 0.0–0.1)
Basophils Relative: 1.2 % (ref 0.0–3.0)
Eosinophils Absolute: 0.1 10*3/uL (ref 0.0–0.7)
Eosinophils Relative: 2.4 % (ref 0.0–5.0)
HCT: 36.7 % — ABNORMAL LOW (ref 39.0–52.0)
Hemoglobin: 12.1 g/dL — ABNORMAL LOW (ref 13.0–17.0)
Lymphocytes Relative: 21.8 % (ref 12.0–46.0)
Lymphs Abs: 1.3 10*3/uL (ref 0.7–4.0)
MCHC: 33 g/dL (ref 30.0–36.0)
MCV: 80.3 fl (ref 78.0–100.0)
Monocytes Absolute: 0.4 10*3/uL (ref 0.1–1.0)
Monocytes Relative: 6.7 % (ref 3.0–12.0)
Neutro Abs: 4.1 10*3/uL (ref 1.4–7.7)
Neutrophils Relative %: 67.9 % (ref 43.0–77.0)
Platelets: 279 10*3/uL (ref 150.0–400.0)
RBC: 4.57 Mil/uL (ref 4.22–5.81)
RDW: 14.9 % (ref 11.5–15.5)
WBC: 6.1 10*3/uL (ref 4.0–10.5)

## 2022-09-05 MED ORDER — OMEPRAZOLE 40 MG PO CPDR
DELAYED_RELEASE_CAPSULE | ORAL | 5 refills | Status: DC
Start: 2022-09-05 — End: 2022-09-06

## 2022-09-05 NOTE — Patient Instructions (Signed)
_______________________________________________________  If you are age 63 or older, your body mass index should be between 23-30. Your Body mass index is 40.18 kg/m. If this is out of the aforementioned range listed, please consider follow up with your Primary Care Provider.  If you are age 31 or younger, your body mass index should be between 19-25. Your Body mass index is 40.18 kg/m. If this is out of the aformentioned range listed, please consider follow up with your Primary Care Provider.   ________________________________________________________  The [facility] providers would like to encourage you to use [facility] to communicate with providers for non-urgent requests or questions.  Due to long hold times on the telephone, sending your provider a message by [facility] may be a faster and more efficient way to get a response.  Please allow 48 business hours for a response.  Please remember that this is for non-urgent requests.  _______________________________________________________  Your provider has requested that you go to the basement level for lab work before leaving today. Press "B" on the elevator. The lab is located at the first door on the left as you exit the elevator.  We have sent the following medications to your pharmacy for you to pick up at your convenience:  Omeprazole  You have been scheduled for an endoscopy. Please follow written instructions given to you at your visit today. If you use inhalers (even only as needed), please bring them with you on the day of your procedure.

## 2022-09-05 NOTE — Progress Notes (Signed)
Chief Complaint: Nausea  Review of pertinent gastrointestinal problems: 1. Ulcerative reflux related esophagitis, focal peptic stricture noted on EGD 02/2017 Dr. 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  He was put on BID PPI and symptoms completely resolved. Follow-up EGD November 2018 showed focal mild stenosis at the GE junction. No evidence of Barrett's mucosa. Given complete lack of dysphagia the mild stenosis was not dilated. He had mild gastritis, biopsies were taken and showed no sign of H. Pylori. He was recommended to stay on proton pump inhibitor once daily.  HPI:    Timothy [name] is a 61 year old male, known to Dr. Ardis Hughs, with a past medical history as listed below including fibromyalgia, Barrett's esophagus and reflux, who was referred to me by London Pepper, MD for a complaint of nausea.    10/26/2019 patient seen in clinic by Dr. Ardis Hughs and at that time was taking Omeprazole 1 pill daily and felt well.  His Omeprazole was refilled 40 mg daily.    08/23/2022 patient seen in the ER for black stools.  Apparently he had been on Tylenol, Ibuprofen and Aleve for several weeks due to COVID and headaches.  He had been having black stools for 1 week.  At that time his NSAIDs were stopped and he was started on a PPI twice daily.    08/28/2022 CBC with a hemoglobin of 11.9 (12.4 on 9/29, 13.4 on 06/01/2020).  Fecal occult positive.  CMP normal.    Today, the patient presents to clinic and explains that he had COVID and then had a residual headache and has been on Ibuprofen and Aleve multiple times a day due to this, about 3 weeks ago he started seeing some black stools and having occasional nausea when he would eat and ended up going to the ER as above.  He was told to increase his Omeprazole to 40 mg twice a day and stop his NSAIDs.  He has done that on the last time he had any bleeding was at time of his last blood draw on  08/28/2022.  He has continued on twice daily Omeprazole but feels like it gives him "an upset stomach".  Also was still occasional nausea.    Denies fever, chills, weight loss or symptoms that awaken him from sleep.  Past Medical History:  Diagnosis Date   Allergy    Asthma    Barrett's esophagus    BPH (benign prostatic hyperplasia)    Fibromyalgia    GERD (gastroesophageal reflux disease)    History of hiatal hernia    History of kidney stones    Hyperlipidemia    Hypertension    Kidney stones    Peripheral neuropathy    Sleep apnea    CPAP 100%   Small fiber neuropathy     Past Surgical History:  Procedure Laterality Date   arm surgery Right 2005   KNEE SURGERY Bilateral 1976   LIPOMA EXCISION N/A 11/06/2016   Procedure: EXCISION LIPOMA ABDOMINAL WALL;  Surgeon: Clovis Riley, MD;  Location: [facility];  Service: General;  Laterality: N/A;   NASAL SINUS SURGERY  2001   tumor 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 Right    2001-2002    Current Outpatient Medications  Medication Sig Dispense Refill   aspirin EC 81 MG tablet Take 81 mg by mouth daily.     bisoprolol-hydrochlorothiazide (ZIAC) 5-6.25 MG tablet TAKE 1 TABLET DAILY. Please make overdue appt with Dr. Marlou Porch before anymore refills.  2nd attempt 15 tablet 0   cetirizine (ZYRTEC) 10 MG tablet Take 10 mg by mouth daily as needed for allergies.      doxazosin (CARDURA XL) 4 MG 24 hr tablet Take 1 tablet (4 mg total) by mouth daily with breakfast. 90 tablet 3   DULoxetine (CYMBALTA) 60 MG capsule Take 1 capsule (60 mg total) by mouth daily. 90 capsule 4   Multiple Vitamin (MULTIVITAMIN) tablet Take 1 tablet by mouth daily.     Naproxen Sodium (ALEVE PO) Take 1-2 tablets by mouth 2 (two) times daily as needed. As needed      omeprazole (PRILOSEC) 40 MG capsule TAKE 1 CAPSULE DAILY 90 capsule 3   ondansetron (ZOFRAN-ODT) 4 MG disintegrating tablet Take 1 tablet (4 mg total) by mouth every 6 (six) hours as needed for nausea or vomiting.  30 tablet 1   pravastatin (PRAVACHOL) 80 MG tablet Take 80 mg by mouth daily.     No current facility-administered medications for this visit.    Allergies as of 09/05/2022   (No Known Allergies)    Family History  Problem Relation Age of Onset   Heart disease Mother    Heart disease Father    Diabetes Father    Skin cancer Father    Valvular heart disease Brother    Heart disease Paternal Grandmother    Heart disease Paternal Grandfather    Chiari malformation Daughter    Other Daughter        Noonan syndrome   Colon cancer Neg Hx    Stomach cancer Neg Hx    Rectal cancer Neg Hx    Esophageal cancer Neg Hx     Social History   Socioeconomic History   Marital status: Married    Spouse name: Angelita Ingles   Number of children: 2   Years of education: 16   Highest education level: Not on file  Occupational History   Occupation: Actuary    Comment: [place]  Tobacco Use   Smoking status: Never   Smokeless tobacco: Never  Vaping Use   Vaping Use: Never used  Substance and Sexual Activity   Alcohol use: Yes    Alcohol/week: 0.0 standard drinks of alcohol    Comment: 2 per month   Drug use: No   Sexual activity: Not on file  Other Topics Concern   Not on file  Social History Narrative   Lives with wife   Caffeine use- coffee 2 cups daily   Social Determinants of Health   Financial Resource Strain: Not on file  Food Insecurity: Not on file  Transportation Needs: Not on file  Physical Activity: Not on file  Stress: Not on file  Social Connections: Not on file  Intimate Partner Violence: Not on file    Review of Systems:    Constitutional: No weight loss, fever or chills Skin: No rash  Cardiovascular: No chest pain  Respiratory: No SOB  Gastrointestinal: See HPI and otherwise negative Genitourinary: No dysuria Neurological: No headache, dizziness or syncope Musculoskeletal: No new muscle or joint pain Hematologic: No  bruising Psychiatric: No history of depression or anxiety   Physical Exam:  Vital signs: BP 122/62   Pulse (!) 58   Ht '5\' 10"'$  (1.778 m)   Wt 280 lb (127 kg)   BMI 40.18 kg/m    Constitutional:   Pleasant overweight Caucasian male appears to be in NAD, Well developed, Well nourished, alert and cooperative Respiratory: Respirations even and unlabored. Lungs  clear to auscultation bilaterally.   No wheezes, crackles, or rhonchi.  Cardiovascular: Normal S1, S2. No MRG. Regular rate and rhythm. No peripheral edema, cyanosis or pallor.  Gastrointestinal:  Soft, nondistended, nontender. No rebound or guarding. Normal bowel sounds. No appreciable masses or hepatomegaly. Rectal:  Not performed.  Psychiatric: Oriented to person, place and time. Demonstrates good judgement and reason without abnormal affect or behaviors.  RELEVANT LABS AND IMAGING: CBC    Component Value Date/Time   WBC 7.1 08/28/2022 1026   RBC 4.38 08/28/2022 1026   HGB 11.9 (L) 08/28/2022 1026   HCT 35.3 (L) 08/28/2022 1026   PLT 250.0 08/28/2022 1026   MCV 80.5 08/28/2022 1026   MCH 26.8 08/23/2022 1833   MCHC 33.7 08/28/2022 1026   RDW 14.9 08/28/2022 1026    CMP     Component Value Date/Time   NA 141 08/23/2022 1834   NA 142 11/09/2018 1013   K 3.9 08/23/2022 1834   CL 104 08/23/2022 1834   CO2 26 08/23/2022 1834   GLUCOSE 98 08/23/2022 1834   BUN 15 08/23/2022 1834   BUN 13 11/09/2018 1013   CREATININE 0.94 08/23/2022 1834   CALCIUM 9.7 08/23/2022 1834   PROT 6.8 08/23/2022 1834   ALBUMIN 4.2 08/23/2022 1834   AST 21 08/23/2022 1834   ALT 26 08/23/2022 1834   ALKPHOS 71 08/23/2022 1834   BILITOT 0.4 08/23/2022 1834   GFRNONAA >60 08/23/2022 1834   GFRAA >60 06/01/2020 1553    Assessment: 1.  Melena: For about 2 weeks, after using NSAIDs, better after increasing PPI to twice daily and stopping NSAID use; likely NSAID related injury 2.  Nausea: History of ulcerative reflux as well as Barrett's  esophagus.  Last EGD November 2018 with no further signs of Barrett's, now with exacerbation of symptoms including melena as above after NSAID use; consider most likely PUD +/- erosive esophagitis 3.  Anemia with Hemoccult positive stool: Related to above  Plan: 1.  Scheduled patient for diagnostic EGD in the [facility] with Dr. Silverio Decamp next Friday as she had sooner availability.  Did provide the patient a detailed list of risks for the procedure and he agrees to proceed. Patient is appropriate for endoscopic procedure(s) in the ambulatory ([facility]) setting. 2.  Continue Omeprazole 40 mg twice daily for now.  Prescribed #60 with 5 refills.  Discussed with patient this is likely can be titrated down after time of EGD. 3.  Repeat CBC today. 4.  Reviewed antireflux diet and lifestyle modifications.  Continue to avoid NSAIDs. 5.  Patient to follow in clinic per recommendations after procedure above.  Patient's note sent to Dr. Silverio Decamp in lab Dr. Ardis Hughs absence.  Ellouise Newer, PA-C [facility] 09/05/2022, 9:31 AM  Cc: London Pepper, MD

## 2022-09-06 ENCOUNTER — Other Ambulatory Visit: Payer: Self-pay

## 2022-09-06 MED ORDER — OMEPRAZOLE 40 MG PO CPDR: 40.0000 mg | DELAYED_RELEASE_CAPSULE | Freq: Two times a day (BID) | ORAL | 1 refills | Status: DC

## 2022-09-06 NOTE — Telephone Encounter (Signed)
Omeprazole refilled as requested for 90 day supply to [facility]. Timothy [name] was seen yesterday by Ellouise Newer PA-C.

## 2022-09-07 ENCOUNTER — Encounter: Payer: Self-pay | Admitting: Certified Registered Nurse Anesthetist

## 2022-09-13 ENCOUNTER — Ambulatory Visit (AMBULATORY_SURGERY_CENTER): Payer: BC Managed Care – PPO | Admitting: Gastroenterology

## 2022-09-13 ENCOUNTER — Encounter: Payer: Self-pay | Admitting: Gastroenterology

## 2022-09-13 VITALS — BP 129/57 | HR 55 | Temp 98.6°F | Resp 16 | Ht 70.0 in | Wt 280.0 lb

## 2022-09-13 DIAGNOSIS — K2210 Ulcer of esophagus without bleeding: Secondary | ICD-10-CM | POA: Diagnosis not present

## 2022-09-13 DIAGNOSIS — K319 Disease of stomach and duodenum, unspecified: Secondary | ICD-10-CM | POA: Diagnosis not present

## 2022-09-13 DIAGNOSIS — K2100 Gastro-esophageal reflux disease with esophagitis, without bleeding: Secondary | ICD-10-CM | POA: Diagnosis not present

## 2022-09-13 DIAGNOSIS — K921 Melena: Secondary | ICD-10-CM

## 2022-09-13 DIAGNOSIS — K449 Diaphragmatic hernia without obstruction or gangrene: Secondary | ICD-10-CM | POA: Diagnosis not present

## 2022-09-13 MED ORDER — SODIUM CHLORIDE 0.9 % IV SOLN: 500.0000 mL | Freq: Once | INTRAVENOUS | Status: DC

## 2022-09-13 MED ORDER — SUCRALFATE 1 G PO TABS: 1.0000 g | ORAL_TABLET | Freq: Two times a day (BID) | ORAL | 0 refills | Status: DC

## 2022-09-13 NOTE — Op Note (Signed)
[facility] Patient Name: Timothy [name] Procedure Date: 09/13/2022 9:47 AM MRN: [id number] Endoscopist: Mauri Pole , MD Age: 61 Referring MD:  Date of Birth: 1961-10-24 Gender: Male Account #: [id number] Procedure:                Upper GI endoscopy Indications:              Recent gastrointestinal bleeding, Suspected upper                            gastrointestinal bleeding, Heme positive stool,                            Melena Medicines:                Monitored Anesthesia Care Procedure:                Pre-Anesthesia Assessment:                           - Prior to the procedure, a History and Physical                            was performed, and patient medications and                            allergies were reviewed. The patient's tolerance of                            previous anesthesia was also reviewed. The risks                            and benefits of the procedure and the sedation                            options and risks were discussed with the patient.                            All questions were answered, and informed consent                            was obtained. Prior Anticoagulants: The patient has                            taken no previous anticoagulant or antiplatelet                            agents. ASA Grade Assessment: III - A patient with                            severe systemic disease. After reviewing the risks                            and benefits, the patient was deemed in  satisfactory condition to undergo the procedure.                           After obtaining informed consent, the endoscope was                            passed under direct vision. Throughout the                            procedure, the patient's blood pressure, pulse, and                            oxygen saturations were monitored continuously. The                            Endoscope was introduced through the mouth,  and                            advanced to the second part of duodenum. The upper                            GI endoscopy was accomplished without difficulty.                            The patient tolerated the procedure well. Scope In: Scope Out: Findings:                 LA Grade B (one or more mucosal breaks greater than                            5 mm, not extending between the tops of two mucosal                            folds) esophagitis with no bleeding was found 33 to                            38 cm from the incisors.                           Bilious retained fluid was found in the                            cardia/hernia.                           A large para esophageal hiatal hernia with a few                            Cameron ulcers was found. The proximal extent of                            the gastric folds (end of tubular esophagus) was 38  cm from the incisors. The hiatal narrowing was 40                            cm from the incisors. The Z-line was 38 cm from the                            incisors. Biopsies were taken with a cold forceps                            for Helicobacter pylori testing.                           The examined duodenum was normal. Complications:            No immediate complications. Estimated Blood Loss:     Estimated blood loss was minimal. Impression:               - LA Grade B reflux esophagitis with no bleeding.                           - Bilious gastric fluid.                           - Large hiatal hernia with a few Cameron ulcers.                            Biopsied.                           - Normal examined duodenum. Recommendation:           - Patient has a contact number available for                            emergencies. The signs and symptoms of potential                            delayed complications were discussed with the                            patient. Return to normal activities  tomorrow.                            Written discharge instructions were provided to the                            patient.                           - Resume previous diet.                           - Continue present medications.                           - Await pathology results.                           -  Follow an antireflux regimen.                           - Continue Omeprazole '40mg'$  twice daily                           - Use sucralfate tablets 1 gram PO BID for 2 weeks.                           - CT chest without contrast to better delineate the                            hernia                           - Return to GI office in 2 months. Mauri Pole, MD 09/13/2022 10:19:59 AM This report has been signed electronically.

## 2022-09-13 NOTE — Progress Notes (Signed)
Called to room to assist during endoscopic procedure.  Patient ID and intended procedure confirmed with present staff. Received instructions for my participation in the procedure from the performing physician.  

## 2022-09-13 NOTE — Progress Notes (Unsigned)
Report given to PACU, vss 

## 2022-09-13 NOTE — Progress Notes (Unsigned)
Pt's states no medical or surgical changes since previsit or office visit. 

## 2022-09-13 NOTE — Patient Instructions (Signed)
YOU HAD AN ENDOSCOPIC PROCEDURE TODAY AT [facility]:   Refer to the procedure report that was given to you for any specific questions about what was found during the examination.  If the procedure report does not answer your questions, please call your gastroenterologist to clarify.  If you requested that your care partner not be given the details of your procedure findings, then the procedure report has been included in a sealed envelope for you to review at your convenience later.  **Handout given on Gastritis**  YOU SHOULD EXPECT: Some feelings of bloating in the abdomen. Passage of more gas than usual.  Walking can help get rid of the air that was put into your GI tract during the procedure and reduce the bloating. If you had a lower endoscopy (such as a colonoscopy or flexible sigmoidoscopy) you may notice spotting of blood in your stool or on the toilet paper. If you underwent a bowel prep for your procedure, you may not have a normal bowel movement for a few days.  Please Note:  You might notice some irritation and congestion in your nose or some drainage.  This is from the oxygen used during your procedure.  There is no need for concern and it should clear up in a day or so.  SYMPTOMS TO REPORT IMMEDIATELY:  Following upper endoscopy (EGD)  Vomiting of blood or coffee ground material  New chest pain or pain under the shoulder blades  Painful or persistently difficult swallowing  New shortness of breath  Fever of 100F or higher  Black, tarry-looking stools  For urgent or emergent issues, a gastroenterologist can be reached at any hour by calling [phone]. Do not use MyChart messaging for urgent concerns.    DIET:  We do recommend a small meal at first, but then you may proceed to your regular diet.  Drink plenty of fluids but you should avoid alcoholic beverages for 24 hours.  ACTIVITY:  You should plan to take it easy for the rest of today and you should NOT  DRIVE or use heavy machinery until tomorrow (because of the sedation medicines used during the test).    FOLLOW UP: Our staff will call the number listed on your records the next business day following your procedure.  We will call around 7:15- 8:00 am to check on you and address any questions or concerns that you may have regarding the information given to you following your procedure. If we do not reach you, we will leave a message.     If any biopsies were taken you will be contacted by phone or by letter within the next 1-3 weeks.  Please call us at [phone] if you have not heard about the biopsies in 3 weeks.     **Follow up in GI office in 2 months**  **CT Chest without contrast to better delineate the hernia**    SIGNATURES/CONFIDENTIALITY: You and/or your care partner have signed paperwork which will be entered into your electronic medical record.  These signatures attest to the fact that that the information above on your After Visit Summary has been reviewed and is understood.  Full responsibility of the confidentiality of this discharge information lies with you and/or your care-partner.

## 2022-09-13 NOTE — Progress Notes (Unsigned)
Please refer to office visit note 09/05/22 by Ellouise Newer. No additional changes in H&P Patient is appropriate for planned procedure(s) and anesthesia in an ambulatory setting  K. Denzil Magnuson , MD [phone]

## 2022-09-16 ENCOUNTER — Telehealth: Payer: Self-pay

## 2022-09-16 NOTE — Telephone Encounter (Signed)
  Follow up Call-     09/13/2022    8:51 AM  Call back number  Post procedure Call Back phone  # [phone]  Permission to leave phone message Yes     Patient questions:  Do you have a fever, pain , or abdominal swelling? No. Pain Score  0 *  Have you tolerated food without any problems? Yes.    Have you been able to return to your normal activities? Yes.    Do you have any questions about your discharge instructions: Diet   No. Medications  No. Follow up visit  No.  Do you have questions or concerns about your Care? No.  Actions: * If pain score is 4 or above: No action needed, pain <4.

## 2022-09-20 ENCOUNTER — Other Ambulatory Visit: Payer: Self-pay

## 2022-09-20 DIAGNOSIS — K449 Diaphragmatic hernia without obstruction or gangrene: Secondary | ICD-10-CM

## 2022-09-24 ENCOUNTER — Other Ambulatory Visit: Payer: Self-pay | Admitting: Gastroenterology

## 2022-09-24 DIAGNOSIS — K921 Melena: Secondary | ICD-10-CM

## 2022-09-29 ENCOUNTER — Encounter: Payer: Self-pay | Admitting: Gastroenterology

## 2022-10-18 ENCOUNTER — Encounter (HOSPITAL_BASED_OUTPATIENT_CLINIC_OR_DEPARTMENT_OTHER): Payer: Self-pay

## 2022-10-18 ENCOUNTER — Other Ambulatory Visit: Payer: Self-pay

## 2022-10-18 DIAGNOSIS — Z20822 Contact with and (suspected) exposure to covid-19: Secondary | ICD-10-CM | POA: Insufficient documentation

## 2022-10-18 DIAGNOSIS — I10 Essential (primary) hypertension: Secondary | ICD-10-CM | POA: Insufficient documentation

## 2022-10-18 DIAGNOSIS — R109 Unspecified abdominal pain: Secondary | ICD-10-CM | POA: Diagnosis not present

## 2022-10-18 DIAGNOSIS — J45909 Unspecified asthma, uncomplicated: Secondary | ICD-10-CM | POA: Diagnosis not present

## 2022-10-18 DIAGNOSIS — R519 Headache, unspecified: Secondary | ICD-10-CM | POA: Insufficient documentation

## 2022-10-18 DIAGNOSIS — Z7982 Long term (current) use of aspirin: Secondary | ICD-10-CM | POA: Diagnosis not present

## 2022-10-18 DIAGNOSIS — K449 Diaphragmatic hernia without obstruction or gangrene: Secondary | ICD-10-CM | POA: Insufficient documentation

## 2022-10-18 LAB — URINALYSIS, ROUTINE W REFLEX MICROSCOPIC
Bilirubin Urine: NEGATIVE
Glucose, UA: NEGATIVE mg/dL
Ketones, ur: NEGATIVE mg/dL
Leukocytes,Ua: NEGATIVE
Nitrite: NEGATIVE
Protein, ur: NEGATIVE mg/dL
Specific Gravity, Urine: 1.035 — ABNORMAL HIGH (ref 1.005–1.030)
pH: 5.5 (ref 5.0–8.0)

## 2022-10-18 LAB — COMPREHENSIVE METABOLIC PANEL
ALT: 16 U/L (ref 0–44)
AST: 16 U/L (ref 15–41)
Albumin: 4.3 g/dL (ref 3.5–5.0)
Alkaline Phosphatase: 84 U/L (ref 38–126)
Anion gap: 11 (ref 5–15)
BUN: 16 mg/dL (ref 8–23)
CO2: 24 mmol/L (ref 22–32)
Calcium: 9.3 mg/dL (ref 8.9–10.3)
Chloride: 107 mmol/L (ref 98–111)
Creatinine, Ser: 1.11 mg/dL (ref 0.61–1.24)
GFR, Estimated: >60 mL/min (ref >60)
Glucose, Bld: 127 mg/dL — ABNORMAL HIGH (ref 70–99)
Potassium: 3.7 mmol/L (ref 3.5–5.1)
Sodium: 142 mmol/L (ref 135–145)
Total Bilirubin: 0.3 mg/dL (ref 0.3–1.2)
Total Protein: 7.2 g/dL (ref 6.5–8.1)

## 2022-10-18 LAB — CBC
HCT: 37.2 % — ABNORMAL LOW (ref 39.0–52.0)
Hemoglobin: 12 g/dL — ABNORMAL LOW (ref 13.0–17.0)
MCH: 25.9 pg — ABNORMAL LOW (ref 26.0–34.0)
MCHC: 32.3 g/dL (ref 30.0–36.0)
MCV: 80.2 fL (ref 80.0–100.0)
Platelets: 295 10*3/uL (ref 150–400)
RBC: 4.64 MIL/uL (ref 4.22–5.81)
RDW: 14.4 % (ref 11.5–15.5)
WBC: 7.3 10*3/uL (ref 4.0–10.5)
nRBC: 0 % (ref 0.0–0.2)

## 2022-10-18 LAB — LIPASE, BLOOD: Lipase: 30 U/L (ref 11–51)

## 2022-10-18 NOTE — ED Triage Notes (Signed)
Pt with prior diagnosis of GI bleed. Pt now has extreme headache, fatigue, and dark stool.   Pt came to be checked ot make sure condition has not worsened.

## 2022-10-19 ENCOUNTER — Encounter (HOSPITAL_BASED_OUTPATIENT_CLINIC_OR_DEPARTMENT_OTHER): Payer: Self-pay | Admitting: Emergency Medicine

## 2022-10-19 ENCOUNTER — Emergency Department (HOSPITAL_BASED_OUTPATIENT_CLINIC_OR_DEPARTMENT_OTHER): Payer: BC Managed Care – PPO

## 2022-10-19 ENCOUNTER — Emergency Department (HOSPITAL_BASED_OUTPATIENT_CLINIC_OR_DEPARTMENT_OTHER)
Admission: EM | Admit: 2022-10-19 | Discharge: 2022-10-19 | Disposition: A | Discharge status: Home / Self Care | Payer: BC Managed Care – PPO | Attending: Emergency Medicine | Admitting: Emergency Medicine

## 2022-10-19 DIAGNOSIS — N281 Cyst of kidney, acquired: Secondary | ICD-10-CM | POA: Diagnosis not present

## 2022-10-19 DIAGNOSIS — K449 Diaphragmatic hernia without obstruction or gangrene: Secondary | ICD-10-CM

## 2022-10-19 LAB — SARS CORONAVIRUS 2 BY RT PCR: SARS Coronavirus 2 by RT PCR: NEGATIVE

## 2022-10-19 LAB — OCCULT BLOOD X 1 CARD TO LAB, STOOL: Fecal Occult Bld: NEGATIVE

## 2022-10-19 MED ORDER — ALUM & MAG HYDROXIDE-SIMETH 200-200-20 MG/5ML PO SUSP
30.0000 mL | Freq: Once | ORAL | Status: AC
  Administered 2022-10-19: 30 mL via ORAL
  Filled 2022-10-19: qty 30

## 2022-10-19 MED ORDER — SUCRALFATE 1 GM/10ML PO SUSP: 1.0000 g | Freq: Three times a day (TID) | ORAL | 0 refills | Status: DC

## 2022-10-19 MED ORDER — SUCRALFATE 1 GM/10ML PO SUSP: 1.0000 g | Freq: Three times a day (TID) | ORAL | Status: DC

## 2022-10-19 NOTE — ED Provider Notes (Signed)
[facility] EMERGENCY DEPT Provider Note   CSN: [id number] Arrival date & time: 10/18/22  1848     History  Chief Complaint  Patient presents with   Abdominal Pain   Back Pain    Timothy [name] is a 61 y.o. male.  The history is provided by the patient.  Illness Location:  This afternoon, at home dark stools with fatigue and headache Quality:  Headache with dark stools Severity:  Moderate Duration:  12 hours Timing:  Constant Progression:  Unchanged Chronicity:  Recurrent Context:  Patient had melena with endoscopy by GI in October Relieved by:  Nothing Worsened by:  Nothing Ineffective treatments:  None Associated symptoms: headaches   Associated symptoms: no congestion, no cough, no diarrhea, no fever, no nausea, no sore throat and no vomiting   Risk factors:  H/o GI bleed Patient with hiatal hernia presents with dark stool, fatigue and headache     Past Medical History:  Diagnosis Date   Allergy    Asthma    Barrett's esophagus    BPH (benign prostatic hyperplasia)    Fibromyalgia    GERD (gastroesophageal reflux disease)    History of hiatal hernia    History of kidney stones    Hyperlipidemia    Hypertension    Kidney stones    Peripheral neuropathy    Sleep apnea    CPAP 100%   Small fiber neuropathy      Home Medications Prior to Admission medications   Medication Sig Start Date End Date Taking? Authorizing Provider  sucralfate (CARAFATE) 1 GM/10ML suspension Take 10 mLs (1 g total) by mouth 4 (four) times daily -  with meals and at bedtime. 10/19/22  Yes Mckinlee Dunk, MD  aspirin EC 81 MG tablet Take 81 mg by mouth daily.    [provider]  bisoprolol-hydrochlorothiazide (ZIAC) 5-6.25 MG tablet TAKE 1 TABLET DAILY. Please make overdue appt with Dr. Marlou Porch before anymore refills. 2nd attempt 05/23/20   Jerline Pain, MD  cetirizine (ZYRTEC) 10 MG tablet Take 10 mg by mouth daily as needed for allergies.     [provider]  doxazosin (CARDURA) 4 MG tablet Take 4 mg by mouth daily. 08/11/22   [provider]  DULoxetine (CYMBALTA) 60 MG capsule Take 1 capsule (60 mg total) by mouth daily. 06/25/22 09/18/23  Lomax, Amy, NP  Multiple Vitamin (MULTIVITAMIN) tablet Take 1 tablet by mouth daily.    [provider]  Naproxen Sodium (ALEVE PO) Take 1-2 tablets by mouth 2 (two) times daily as needed. As needed     [provider]  omeprazole (PRILOSEC) 40 MG capsule Take 1 capsule (40 mg total) by mouth 2 (two) times daily before a meal. 09/06/22   Lemmon, Lavone Nian, PA  ondansetron (ZOFRAN-ODT) 4 MG disintegrating tablet Take 1 tablet (4 mg total) by mouth every 6 (six) hours as needed for nausea or vomiting. 09/02/22   Cirigliano, Vito V, DO  pravastatin (PRAVACHOL) 80 MG tablet Take 80 mg by mouth daily. 06/18/22   [provider]  sucralfate (CARAFATE) 1 g tablet Take 1 tablet (1 g total) by mouth 2 (two) times daily for 14 days. 09/13/22 09/27/22  Mauri Pole, MD      Allergies    Patient has no known allergies.    Review of Systems   Review of Systems  Constitutional:  Negative for fever.  HENT:  Negative for congestion and sore throat.   Eyes:  Negative for  redness.  Respiratory:  Negative for cough.   Gastrointestinal:  Negative for diarrhea, nausea and vomiting.  Neurological:  Positive for headaches.  All other systems reviewed and are negative.   Physical Exam Updated Vital Signs BP 139/74 (BP Location: Right Arm)   Pulse 77   Temp 98.4 F (36.9 C) (Oral)   Resp 18   Ht '5\' 10"'$  (1.778 m)   Wt 127.5 kg   SpO2 98%   BMI 40.32 kg/m  Physical Exam Vitals and nursing note reviewed. Exam conducted with a chaperone present.  Constitutional:      General: He is not in acute distress.    Appearance: He is well-developed. He is not diaphoretic.  HENT:     Head: Normocephalic and atraumatic.     Nose: Nose normal.  Eyes:      Conjunctiva/sclera: Conjunctivae normal.     Pupils: Pupils are equal, round, and reactive to light.  Cardiovascular:     Rate and Rhythm: Normal rate and regular rhythm.     Pulses: Normal pulses.     Heart sounds: Normal heart sounds.  Pulmonary:     Effort: Pulmonary effort is normal.     Breath sounds: Normal breath sounds. No wheezing or rales.  Abdominal:     General: Bowel sounds are normal.     Palpations: Abdomen is soft.     Tenderness: There is no abdominal tenderness. There is no guarding or rebound.  Genitourinary:    Rectum: Guaiac result negative.  Musculoskeletal:        General: Normal range of motion.     Cervical back: Normal range of motion and neck supple.  Skin:    General: Skin is warm and dry.     Capillary Refill: Capillary refill takes less than 2 seconds.  Neurological:     General: No focal deficit present.     Mental Status: He is alert and oriented to person, place, and time.  Psychiatric:        Mood and Affect: Mood normal.        Behavior: Behavior normal.     ED Results / Procedures / Treatments   Labs (all labs ordered are listed, but only abnormal results are displayed) Results for orders placed or performed during the hospital encounter of 10/19/22  SARS Coronavirus 2 by RT PCR (hospital order, performed in [facility] hospital lab) *cepheid single result test* Anterior Nasal Swab   Specimen: Anterior Nasal Swab  Result Value Ref Range   SARS Coronavirus 2 by RT PCR NEGATIVE NEGATIVE  Lipase, blood  Result Value Ref Range   Lipase 30 11 - 51 U/L  Comprehensive metabolic panel  Result Value Ref Range   Sodium 142 135 - 145 mmol/L   Potassium 3.7 3.5 - 5.1 mmol/L   Chloride 107 98 - 111 mmol/L   CO2 24 22 - 32 mmol/L   Glucose, Bld 127 (H) 70 - 99 mg/dL   BUN 16 8 - 23 mg/dL   Creatinine, Ser 1.11 0.61 - 1.24 mg/dL   Calcium 9.3 8.9 - 10.3 mg/dL   Total Protein 7.2 6.5 - 8.1 g/dL   Albumin 4.3 3.5 - 5.0 g/dL   AST 16 15 - 41 U/L    ALT 16 0 - 44 U/L   Alkaline Phosphatase 84 38 - 126 U/L   Total Bilirubin 0.3 0.3 - 1.2 mg/dL   GFR, Estimated >60 >60 mL/min   Anion gap 11 5 - 15  CBC  Result Value Ref Range   WBC 7.3 4.0 - 10.5 K/uL   RBC 4.64 4.22 - 5.81 MIL/uL   Hemoglobin 12.0 (L) 13.0 - 17.0 g/dL   HCT 37.2 (L) 39.0 - 52.0 %   MCV 80.2 80.0 - 100.0 fL   MCH 25.9 (L) 26.0 - 34.0 pg   MCHC 32.3 30.0 - 36.0 g/dL   RDW 14.4 11.5 - 15.5 %   Platelets 295 150 - 400 K/uL   nRBC 0.0 0.0 - 0.2 %  Urinalysis, Routine w reflex microscopic  Result Value Ref Range   Color, Urine YELLOW YELLOW   APPearance CLEAR CLEAR   Specific Gravity, Urine 1.035 (H) 1.005 - 1.030   pH 5.5 5.0 - 8.0   Glucose, UA NEGATIVE NEGATIVE mg/dL   Hgb urine dipstick MODERATE (A) NEGATIVE   Bilirubin Urine NEGATIVE NEGATIVE   Ketones, ur NEGATIVE NEGATIVE mg/dL   Protein, ur NEGATIVE NEGATIVE mg/dL   Nitrite NEGATIVE NEGATIVE   Leukocytes,Ua NEGATIVE NEGATIVE   RBC / HPF 6-10 0 - 5 RBC/hpf   WBC, UA 0-5 0 - 5 WBC/hpf   Squamous Epithelial / LPF 0-5 0 - 5   Mucus PRESENT   Occult blood card to lab, stool Provider will collect  Result Value Ref Range   Fecal Occult Bld NEGATIVE NEGATIVE   CT Renal Stone Study  Result Date: 10/19/2022 CLINICAL DATA:  Flank pain and known history of prior GI hemorrhage EXAM: CT ABDOMEN AND PELVIS WITHOUT CONTRAST TECHNIQUE: Multidetector CT imaging of the abdomen and pelvis was performed following the standard protocol without IV contrast. RADIATION DOSE REDUCTION: This exam was performed according to the departmental dose-optimization program which includes automated exposure control, adjustment of the mA and/or kV according to patient size and/or use of iterative reconstruction technique. COMPARISON:  02/22/2017 FINDINGS: Lower chest: Large paraesophageal hiatal hernia is noted with associated left-sided basilar atelectasis Hepatobiliary: No focal liver abnormality is seen. No gallstones, gallbladder  wall thickening, or biliary dilatation. Pancreas: Unremarkable. No pancreatic ductal dilatation or surrounding inflammatory changes. Spleen: Normal in size without focal abnormality. Adrenals/Urinary Tract: Adrenal glands are within normal limits. Kidneys are well visualized bilaterally. No renal calculi or obstructive changes are seen. Small exophytic hypodensity is noted in the right kidney too small for adequate characterization. No follow-up is recommended. Simple appearing cyst is noted in the lower pole of the left kidney. No further follow-up is recommended. The bladder is decompressed. Stomach/Bowel: The appendix is within normal limits. No obstructive or inflammatory changes of the colon are noted. Small bowel and stomach are otherwise within normal limits aside from the previously mentioned hernia. Vascular/Lymphatic: Aortic atherosclerosis. No enlarged abdominal or pelvic lymph nodes. Reproductive: Prostate is unremarkable. Other: Small fat containing inguinal hernias are noted. No free fluid or free air is noted. Musculoskeletal: No acute or significant osseous findings. IMPRESSION: Large paraesophageal hiatal hernia with associated left basilar atelectasis. This is stable from the previous exam. No acute abnormality to correspond with the given clinical history is noted. Electronically Signed   By: Inez Catalina M.D.   On: 10/19/2022 00:36    Radiology CT Renal Stone Study  Result Date: 10/19/2022 CLINICAL DATA:  Flank pain and known history of prior GI hemorrhage EXAM: CT ABDOMEN AND PELVIS WITHOUT CONTRAST TECHNIQUE: Multidetector CT imaging of the abdomen and pelvis was performed following the standard protocol without IV contrast. RADIATION DOSE REDUCTION: This exam was performed according to the departmental dose-optimization program which includes automated exposure control, adjustment of  the mA and/or kV according to patient size and/or use of iterative reconstruction technique. COMPARISON:   02/22/2017 FINDINGS: Lower chest: Large paraesophageal hiatal hernia is noted with associated left-sided basilar atelectasis Hepatobiliary: No focal liver abnormality is seen. No gallstones, gallbladder wall thickening, or biliary dilatation. Pancreas: Unremarkable. No pancreatic ductal dilatation or surrounding inflammatory changes. Spleen: Normal in size without focal abnormality. Adrenals/Urinary Tract: Adrenal glands are within normal limits. Kidneys are well visualized bilaterally. No renal calculi or obstructive changes are seen. Small exophytic hypodensity is noted in the right kidney too small for adequate characterization. No follow-up is recommended. Simple appearing cyst is noted in the lower pole of the left kidney. No further follow-up is recommended. The bladder is decompressed. Stomach/Bowel: The appendix is within normal limits. No obstructive or inflammatory changes of the colon are noted. Small bowel and stomach are otherwise within normal limits aside from the previously mentioned hernia. Vascular/Lymphatic: Aortic atherosclerosis. No enlarged abdominal or pelvic lymph nodes. Reproductive: Prostate is unremarkable. Other: Small fat containing inguinal hernias are noted. No free fluid or free air is noted. Musculoskeletal: No acute or significant osseous findings. IMPRESSION: Large paraesophageal hiatal hernia with associated left basilar atelectasis. This is stable from the previous exam. No acute abnormality to correspond with the given clinical history is noted. Electronically Signed   By: Inez Catalina M.D.   On: 10/19/2022 00:36    Procedures Procedures    Medications Ordered in ED Medications  sucralfate (CARAFATE) 1 GM/10ML suspension 1 g (has no administration in time range)  alum & mag hydroxide-simeth (MAALOX/MYLANTA) 200-200-20 MG/5ML suspension 30 mL (30 mLs Oral Given 10/19/22 0107)    ED Course/ Medical Decision Making/ A&P                           Medical Decision  Making Patient presents with headache and dark stool and fatigue and is concerned for GI bleed   Amount and/or Complexity of Data Reviewed External Data Reviewed: notes.    Details: Previous notes reviewed  Labs: ordered.    Details: All labs reviewed: negative hemoccult.  Negative covid swab.  Lipase normal 30.  Normal sodium 142, normal potassium 3.7, normal creatinine 1.11, normal LFTs.  Normal white count 7.3, hemoglobin 12 low, platelets normal.  Urine is negative for UTI Radiology: ordered and independent interpretation performed.    Details: Hiatal hernia on CT  Risk OTC drugs. Prescription drug management. Risk Details: No bleeding at this time, stool was a normal color.  Well appearing,  negative covid.  Will start carafate and bland diet and have patient follow up with GI for ongoing care. Strict return    Final Clinical Impression(s) / ED Diagnoses Final diagnoses:  Hiatal hernia   Return for intractable cough, coughing up blood, fevers > 100.4 unrelieved by medication, shortness of breath, intractable vomiting, chest pain, shortness of breath, weakness, numbness, changes in speech, facial asymmetry, abdominal pain, passing out, Inability to tolerate liquids or food, cough, altered mental status or any concerns. No signs of systemic illness or infection. The patient is nontoxic-appearing on exam and vital signs are within normal limits.  I have reviewed the triage vital signs and the nursing notes. Pertinent labs & imaging results that were available during my care of the patient were reviewed by me and considered in my medical decision making (see chart for details). After history, exam, and medical workup I feel the patient has been appropriately medically screened and  is safe for discharge home. Pertinent diagnoses were discussed with the patient. Patient was given return precautions.  Rx / DC Orders ED Discharge Orders          Ordered    sucralfate (CARAFATE) 1 GM/10ML  suspension  3 times daily with meals & bedtime        10/19/22 0117              Bode Pieper, MD 10/19/22 0127

## 2022-10-20 ENCOUNTER — Emergency Department (HOSPITAL_BASED_OUTPATIENT_CLINIC_OR_DEPARTMENT_OTHER): Payer: BC Managed Care – PPO

## 2022-10-20 ENCOUNTER — Other Ambulatory Visit: Payer: Self-pay

## 2022-10-20 ENCOUNTER — Emergency Department (HOSPITAL_BASED_OUTPATIENT_CLINIC_OR_DEPARTMENT_OTHER): Payer: BC Managed Care – PPO | Admitting: Radiology

## 2022-10-20 ENCOUNTER — Emergency Department (HOSPITAL_BASED_OUTPATIENT_CLINIC_OR_DEPARTMENT_OTHER)
Admission: EM | Admit: 2022-10-20 | Discharge: 2022-10-20 | Disposition: A | Discharge status: Home / Self Care | Payer: BC Managed Care – PPO | Attending: Emergency Medicine | Admitting: Emergency Medicine

## 2022-10-20 DIAGNOSIS — I10 Essential (primary) hypertension: Secondary | ICD-10-CM | POA: Diagnosis not present

## 2022-10-20 DIAGNOSIS — M542 Cervicalgia: Secondary | ICD-10-CM | POA: Diagnosis not present

## 2022-10-20 DIAGNOSIS — M25511 Pain in right shoulder: Secondary | ICD-10-CM | POA: Insufficient documentation

## 2022-10-20 DIAGNOSIS — R001 Bradycardia, unspecified: Secondary | ICD-10-CM | POA: Diagnosis not present

## 2022-10-20 DIAGNOSIS — Y92410 Unspecified street and highway as the place of occurrence of the external cause: Secondary | ICD-10-CM | POA: Diagnosis not present
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
## 2022-11-11 DIAGNOSIS — Z125 Encounter for screening for malignant neoplasm of prostate: Secondary | ICD-10-CM | POA: Diagnosis not present

## 2022-11-11 DIAGNOSIS — R3129 Other microscopic hematuria: Secondary | ICD-10-CM | POA: Diagnosis not present

## 2022-11-11 DIAGNOSIS — R3912 Poor urinary stream: Secondary | ICD-10-CM | POA: Diagnosis not present

## 2022-11-21 DIAGNOSIS — I10 Essential (primary) hypertension: Secondary | ICD-10-CM | POA: Diagnosis not present

## 2022-11-21 DIAGNOSIS — R7303 Prediabetes: Secondary | ICD-10-CM | POA: Diagnosis not present

## 2022-11-21 DIAGNOSIS — Z23 Encounter for immunization: Secondary | ICD-10-CM | POA: Diagnosis not present

## 2022-11-21 DIAGNOSIS — E785 Hyperlipidemia, unspecified: Secondary | ICD-10-CM | POA: Diagnosis not present

## 2022-11-21 DIAGNOSIS — G629 Polyneuropathy, unspecified: Secondary | ICD-10-CM | POA: Diagnosis not present

## 2022-11-21 DIAGNOSIS — Z0000 Encounter for general adult medical examination without abnormal findings: Secondary | ICD-10-CM | POA: Diagnosis not present

## 2022-11-27 ENCOUNTER — Ambulatory Visit (INDEPENDENT_AMBULATORY_CARE_PROVIDER_SITE_OTHER): Payer: BC Managed Care – PPO | Admitting: Gastroenterology

## 2022-11-27 ENCOUNTER — Encounter: Payer: Self-pay | Admitting: Gastroenterology

## 2022-11-27 VITALS — BP 126/72 | HR 58 | Ht 70.0 in | Wt 283.0 lb

## 2022-11-27 DIAGNOSIS — K449 Diaphragmatic hernia without obstruction or gangrene: Secondary | ICD-10-CM

## 2022-11-27 DIAGNOSIS — K219 Gastro-esophageal reflux disease without esophagitis: Secondary | ICD-10-CM

## 2022-11-27 DIAGNOSIS — Z87828 Personal history of other (healed) physical injury and trauma: Secondary | ICD-10-CM | POA: Diagnosis not present

## 2022-11-27 MED ORDER — OMEPRAZOLE 40 MG PO CPDR: 40.0000 mg | DELAYED_RELEASE_CAPSULE | Freq: Two times a day (BID) | ORAL | 6 refills | Status: DC

## 2022-11-27 NOTE — Patient Instructions (Addendum)
We have sent the following medications to your pharmacy for you to pick up at your convenience: omeprazole   Discontinue sucralfate.   We have sent a referral to [facility]. They will contact you directly with an appointment date and time.   The [facility] providers would like to encourage you to use [facility] to communicate with providers for non-urgent requests or questions.  Due to long hold times on the telephone, sending your provider a message by [facility] may be a faster and more efficient way to get a response.  Please allow 48 business hours for a response.  Please remember that this is for non-urgent requests.   Due to recent changes in healthcare laws, you may see the results of your imaging and laboratory studies on MyChart before your provider has had a chance to review them.  We understand that in some cases there may be results that are confusing or concerning to you. Not all laboratory results come back in the same time frame and the provider may be waiting for multiple results in order to interpret others.  Please give [place] 48 hours in order for your provider to thoroughly review all the results before contacting the office for clarification of your results.

## 2022-11-27 NOTE — Progress Notes (Signed)
Timothy [name]    [id number]    1961/11/13  Primary Care Physician:Morrow, Marjory Lies, MD  Referring Physician: London Pepper, MD [address],  [place] [address]   Chief complaint:  GERD  HPI:  EGD 09/13/22  - LA Grade B reflux esophagitis with no bleeding. - Bilious gastric fluid. - Large hiatal hernia with a few Cameron ulcers. Biopsied.   Outpatient Encounter Medications as of 11/27/2022  Medication Sig   aspirin EC 81 MG tablet Take 81 mg by mouth daily.   bisoprolol-hydrochlorothiazide (ZIAC) 5-6.25 MG tablet TAKE 1 TABLET DAILY. Please make overdue appt with Dr. Marlou Porch before anymore refills. 2nd attempt   cetirizine (ZYRTEC) 10 MG tablet Take 10 mg by mouth daily as needed for allergies.    doxazosin (CARDURA) 4 MG tablet Take 4 mg by mouth daily.   DULoxetine (CYMBALTA) 60 MG capsule Take 1 capsule (60 mg total) by mouth daily.   Multiple Vitamin (MULTIVITAMIN) tablet Take 1 tablet by mouth daily.   Naproxen Sodium (ALEVE PO) Take 1-2 tablets by mouth 2 (two) times daily as needed. As needed    omeprazole (PRILOSEC) 40 MG capsule Take 1 capsule (40 mg total) by mouth 2 (two) times daily before a meal.   ondansetron (ZOFRAN-ODT) 4 MG disintegrating tablet Take 1 tablet (4 mg total) by mouth every 6 (six) hours as needed for nausea or vomiting.   pravastatin (PRAVACHOL) 80 MG tablet Take 80 mg by mouth daily.   sucralfate (CARAFATE) 1 g tablet Take 1 tablet (1 g total) by mouth 2 (two) times daily for 14 days.   sucralfate (CARAFATE) 1 GM/10ML suspension Take 10 mLs (1 g total) by mouth 4 (four) times daily -  with meals and at bedtime.   No facility-administered encounter medications on file as of 11/27/2022.    Allergies as of 11/27/2022   (No Known Allergies)    Past Medical History:  Diagnosis Date   Allergy    Asthma    Barrett's esophagus    BPH (benign prostatic hyperplasia)    Fibromyalgia    GERD (gastroesophageal reflux  disease)    History of hiatal hernia    History of kidney stones    Hyperlipidemia    Hypertension    Kidney stones    Peripheral neuropathy    Sleep apnea    CPAP 100%   Small fiber neuropathy     Past Surgical History:  Procedure Laterality Date   arm surgery Right 2005   KNEE SURGERY Bilateral 1976   LIPOMA EXCISION N/A 11/06/2016   Procedure: EXCISION LIPOMA ABDOMINAL WALL;  Surgeon: Clovis Riley, MD;  Location: [facility];  Service: General;  Laterality: N/A;   NASAL SINUS SURGERY  2001   tumor 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 Right    2001-2002    Family History  Problem Relation Age of Onset   Heart disease Mother    Heart disease Father    Diabetes Father    Skin cancer Father    Valvular heart disease Brother    Heart disease Paternal Grandmother    Heart disease Paternal Grandfather    Chiari malformation Daughter    Other Daughter        Noonan syndrome   Colon cancer Neg Hx    Stomach cancer Neg Hx    Rectal cancer Neg Hx    Esophageal cancer Neg Hx     Social History  Socioeconomic History   Marital status: Married    Spouse name: Angelita Ingles   Number of children: 2   Years of education: 16   Highest education level: Not on file  Occupational History   Occupation: Actuary    Comment: [place]  Tobacco Use   Smoking status: Never   Smokeless tobacco: Never  Vaping Use   Vaping Use: Never used  Substance and Sexual Activity   Alcohol use: Yes    Alcohol/week: 0.0 standard drinks of alcohol    Comment: 2 per month   Drug use: No   Sexual activity: Not on file  Other Topics Concern   Not on file  Social History Narrative   Lives with wife   Caffeine use- coffee 2 cups daily   Social Determinants of Health   Financial Resource Strain: Not on file  Food Insecurity: Not on file  Transportation Needs: Not on file  Physical Activity: Not on file  Stress: Not on file  Social Connections: Not on file  Intimate Partner Violence:  Not on file      Review of systems: All other review of systems negative except as mentioned in the HPI.   Physical Exam: There were no vitals filed for this visit. There is no height or weight on file to calculate BMI. Gen:      No acute distress HEENT:  sclera anicteric Abd:      soft, non-tender; no palpable masses, no distension Ext:    No edema Neuro: alert and oriented x 3 Psych: normal mood and affect  Data Reviewed:  Reviewed labs, radiology imaging, old records and pertinent past GI work up   Assessment and Plan/Recommendations:  ***  This visit required *** minutes of patient care (this includes precharting, chart review, review of results, face-to-face time used for counseling as well as treatment plan and follow-up. The patient was provided an opportunity to ask questions and all were answered. The patient agreed with the plan and demonstrated an understanding of the instructions.  Damaris Hippo , MD    CC: London Pepper, MD

## 2022-11-28 ENCOUNTER — Telehealth: Payer: Self-pay | Admitting: *Deleted

## 2022-11-28 ENCOUNTER — Encounter: Payer: Self-pay | Admitting: Gastroenterology

## 2022-11-28 NOTE — Telephone Encounter (Signed)
Ok, thank you

## 2022-11-28 NOTE — Telephone Encounter (Signed)
Tried contacting [facility] No answer will try again

## 2022-11-28 NOTE — Telephone Encounter (Signed)
Dr [name] I contacted the patient and he had a colonoscopy 9 years ago and said he does not need another one now because he was told 10 years for a normal colonoscopy. Timothy [name]  His report is at [facility] Dr Orland Mustard. I will try to obtain his report

## 2022-12-02 NOTE — Telephone Encounter (Signed)
I put in recall for 09/26/2023    I left a message for medical records [facility] to send [place] a copy of patients colon report if he does not need to sign a release

## 2023-02-04 DIAGNOSIS — R7301 Impaired fasting glucose: Secondary | ICD-10-CM | POA: Diagnosis not present

## 2023-02-04 DIAGNOSIS — I10 Essential (primary) hypertension: Secondary | ICD-10-CM | POA: Diagnosis not present

## 2023-02-04 DIAGNOSIS — G629 Polyneuropathy, unspecified: Secondary | ICD-10-CM | POA: Diagnosis not present

## 2023-02-04 DIAGNOSIS — Z6841 Body Mass Index (BMI) 40.0 and over, adult: Secondary | ICD-10-CM | POA: Diagnosis not present

## 2023-02-12 DIAGNOSIS — Z124 Encounter for screening for malignant neoplasm of cervix: Secondary | ICD-10-CM | POA: Diagnosis not present

## 2023-02-12 DIAGNOSIS — Z01419 Encounter for gynecological examination (general) (routine) without abnormal findings: Secondary | ICD-10-CM | POA: Diagnosis not present

## 2023-02-12 DIAGNOSIS — Z780 Asymptomatic menopausal state: Secondary | ICD-10-CM | POA: Diagnosis not present

## 2023-02-12 DIAGNOSIS — R232 Flushing: Secondary | ICD-10-CM | POA: Diagnosis not present

## 2023-03-07 ENCOUNTER — Institutional Professional Consult (permissible substitution): Payer: BC Managed Care – PPO | Admitting: Diagnostic Neuroimaging

## 2023-03-13 DIAGNOSIS — Z6841 Body Mass Index (BMI) 40.0 and over, adult: Secondary | ICD-10-CM | POA: Diagnosis not present

## 2023-03-13 DIAGNOSIS — R7301 Impaired fasting glucose: Secondary | ICD-10-CM | POA: Diagnosis not present

## 2023-03-13 DIAGNOSIS — D649 Anemia, unspecified: Secondary | ICD-10-CM | POA: Diagnosis not present

## 2023-05-09 DIAGNOSIS — E785 Hyperlipidemia, unspecified: Secondary | ICD-10-CM | POA: Diagnosis not present

## 2023-05-09 DIAGNOSIS — M79673 Pain in unspecified foot: Secondary | ICD-10-CM | POA: Diagnosis not present

## 2023-05-09 DIAGNOSIS — E119 Type 2 diabetes mellitus without complications: Secondary | ICD-10-CM | POA: Diagnosis not present

## 2023-05-09 DIAGNOSIS — I10 Essential (primary) hypertension: Secondary | ICD-10-CM | POA: Diagnosis not present

## 2023-06-20 DIAGNOSIS — N401 Enlarged prostate with lower urinary tract symptoms: Secondary | ICD-10-CM | POA: Diagnosis not present

## 2023-06-20 DIAGNOSIS — E785 Hyperlipidemia, unspecified: Secondary | ICD-10-CM | POA: Diagnosis not present

## 2023-06-20 DIAGNOSIS — E119 Type 2 diabetes mellitus without complications: Secondary | ICD-10-CM | POA: Diagnosis not present

## 2023-06-20 DIAGNOSIS — D649 Anemia, unspecified: Secondary | ICD-10-CM | POA: Diagnosis not present

## 2023-06-25 NOTE — Patient Instructions (Addendum)
Below is our plan:  We will continue duloxetine 60mg  daily. Continue healthy lifestyle changes and ALA   Please make sure you are staying well hydrated. I recommend 50-60 ounces daily. Well balanced diet and regular exercise encouraged. Consistent sleep schedule with 6-8 hours recommended.   Please continue follow up with care team as directed.   Follow up with we in  1 year   You may receive a survey regarding today's visit. I encourage you to leave honest feed back as I do use this information to improve patient care. Thank you for seeing me today!

## 2023-06-25 NOTE — Progress Notes (Signed)
PATIENT: Timothy [name] DOB: [birth year]  REASON FOR VISIT: follow up HISTORY FROM: patient  Chief Complaint  Patient presents with   Follow-up    Pt in room 1. Here for polyneuropathy follow up. Pt reports doing well. Pt has lost weight, reports his neuropathy in feet is better. Pt reports doing heal plantar fasciitis exercises. Pt needs refills for Cymbalta sent to mail order.     HISTORY OF PRESENT ILLNESS:  06/30/23 ALL: Timothy [name] returns for follow up for neuropathy. He was last seen 06/2022 and doing well on duloxetine and ALA. Since, he reports doing well. He has lost about 30lbs on GOLO diet supplements. He reports A1C is down to 6.3. He does feel neuropathy has improved. He is having some trouble with plantar fasciitis. Exercises seem to help. He is followed closely by PCP.   06/25/2022 ALL: [name] returns for follow up for neuropathy. He continues duloxetine 60mg  daily and ALA 600mg  daily. Headaches and cognitive fog are better off gabapentin. He feels that burning in feet may also be better since discontinuing. He is working on [employer]. Considering GOLO diet program.   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  May 2022 patient had COVID with malaise, fatigue, cough, fevers.  He was prescribed paxlovid and had some improvement.  However after few days symptoms returned.  This time he had more severe COVID symptoms.  Also had headaches, brain fog and fatigue.   Patient having 1-2 headaches per day lasting 1 hour at a time.  Headaches occurring on a daily basis.  Also having significant fatigue and brain fog.  Fortunately his symptoms are gradually proving over time.  He is using Tylenol with mild relief.  He is sleeping well using CPAP every night.  05/22/2020 ALL: Kaizer Dissinger is a 63 y.o. male here today for follow up for neuropathy. We increased duloxetine dose to 60mg  daily in 10/2019. He does feel that this has helped. He has continued  gabapentin 600mg  at bedtime. Numbness and tingling is still present. It is fairly constant but manageable. He travels for work and may be walking for 12-14 hours. These days are worse. He is no longer wearing a carpal tunnel brace as symptoms have resolved. He is followed regularly by PCP. DM is well controlled. He is feeling well today and without complaints.   HISTORY: (copied from my note on 11/23/2019)  Timothy [name] is a 62 y.o. male here today for follow up for neuropathy. He continues gabapentin 600mg  at night and duloxetine 30mg  daily. He feels that lower extremity numbness and tingling is progressing. Worse on left. Mostly involves bid toe and ball of foot. He does feel that symptoms improved initially with duloxetine. Over the past two months symptoms have worsened. He has also noted left thumb numbness, specifically when he wakes in the mornings. 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 He has a history of prediabetes. Last A1C was 6.    HISTORY: (copied from my note on 03/30/2019)   Timothy [name] is a 62 y.o. male for follow up of neuropathy.  [name] reports that neuropathy symptoms have steadily increased over the past year.  He reports both numbness and tingling in bilateral lower extremities.  At his last visit nearly a year ago he was advised to increase gabapentin to 600 mg at night.  He reports this did help for short period of time.  He can definitely  tell if he misses his dose of gabapentin.  He continues to be active.  He has not correlated any worsening or improvement with activity.  He feels that neuropathy is constant and not worse at any particular time.  He is under more stress currently as his daughter has been hospitalized.  He travels frequently and spends a lot of time out of town.  He is followed closely by his PCP who has been monitoring lab work.  He reports labs have been normal.   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  He is currently on gabapentin.  This was started about 1 year ago he says his symptoms have gone away on the right side but he still feels numbness and tingling in his left leg and foot he is only taking gabapentin 300 at night the morning dose makes him too drowsy.  He also has a history of obesity obstructive sleep apnea using CPAP.  He also has an area on his left knee at the back which he says swells at times.  He returns for reevaluation   UPDATE 4/26/2019CM Timothy [name],, 62 year old male returns for follow-up with history of neuropathy in both feet worse over the last 6 months.  He continues to have numbness feelings in the left arm leg and foot which are fairly constant no change in the right side.  He was just started on gabapentin 6 months ago.  He is morbidly obese and have obstructive sleep apnea using CPAP.  His CPAP is followed by primary care.  He continues to work full-time and travels for his job.  He returns for reevaluation   UPDATE 10/25/2018CM Timothy [name], 62 year old male returns for follow-up for small fiber neuropathy.he has begun having daily symptoms of neuropathy in the left foot and lack that has been progressively getting worse over the last year.He has never been on any medication for this.He feels he cannot be as active as previously due to burning pain in the feet. When his activity decreased his weight increased. He was recently diagnosed with hiatal hernia and Barrett's esophagitis.He has a history of obstructive sleep apnea and uses CPAP.He returns for reevaluation   04/12/16 VP36 year old right-handed male with hypertension, hypercholesteremia, sleep apnea, kidney stones, here for evaluation of back pain and numbness in the feet. Symptoms present for at least 6 months.   For past 6 months has had midline low back pain.  Symptoms worse when he sleeps for a long time. Worsened early morning. Patient improved after activity. No radiating symptoms. No specific triggering factors. No history of severe accident or trauma.   Around the same time patient has also noted numbness and tingling in the balls of the feet, left worse than right side. Symptoms seem to be independent from back pain problems. Patient has had lab screening testing with B12, TSH, A1c which were unremarkable.    REVIEW OF SYSTEMS: Out of a complete 14 system review of symptoms, the patient complains only of the following symptoms, numbness of feet, and all other reviewed systems are negative.   ALLERGIES: No Known Allergies  HOME MEDICATIONS: Outpatient Medications Prior to Visit  Medication Sig Dispense Refill   aspirin EC 81 MG tablet Take 81 mg by mouth daily.     bisoprolol-hydrochlorothiazide (ZIAC) 5-6.25 MG tablet TAKE 1 TABLET DAILY. Please make overdue appt with Dr. Anne Fu before anymore  refills. 2nd attempt 15 tablet 0   cetirizine (ZYRTEC) 10 MG tablet Take 10 mg by mouth daily as needed for allergies.      doxazosin (CARDURA) 4 MG tablet Take 4 mg by mouth daily.     metFORMIN (GLUCOPHAGE) 500 MG tablet Take 500 mg by mouth 2 (two) times daily with a meal.     Multiple Vitamin (MULTIVITAMIN) tablet Take 1 tablet by mouth daily.     omeprazole (PRILOSEC) 40 MG capsule Take 1 capsule (40 mg total) by mouth 2 (two) times daily before a meal. 180 capsule 6   ondansetron (ZOFRAN-ODT) 4 MG disintegrating tablet Take 1 tablet (4 mg total) by mouth every 6 (six) hours as needed for nausea or vomiting. 30 tablet 1   pravastatin (PRAVACHOL) 80 MG tablet Take 80 mg by mouth daily.     Naproxen Sodium (ALEVE PO) Take 1-2 tablets by mouth 2 (two) times daily as needed. As needed  (Patient not taking: Reported on 11/27/2022)     DULoxetine (CYMBALTA) 60 MG capsule Take 1 capsule (60 mg total) by mouth daily. 90 capsule 4   No facility-administered  medications prior to visit.    PAST MEDICAL HISTORY: Past Medical History:  Diagnosis Date   Allergy    Asthma    Barrett's esophagus    BPH (benign prostatic hyperplasia)    Fibromyalgia    GERD (gastroesophageal reflux disease)    History of hiatal hernia    History of kidney stones    Hyperlipidemia    Hypertension    Kidney stones    Peripheral neuropathy    Sleep apnea    CPAP 100%   Small fiber neuropathy     PAST SURGICAL HISTORY: Past Surgical History:  Procedure Laterality Date   arm surgery Right 2005   KNEE SURGERY Bilateral 1976   LIPOMA EXCISION N/A 11/06/2016   Procedure: EXCISION LIPOMA ABDOMINAL WALL;  Surgeon: Berna Bue, MD;  Location: [facility];  Service: General;  Laterality: N/A;   NASAL SINUS SURGERY  2001   tumor 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 Right    2001-2002    FAMILY HISTORY: Family History  Problem Relation Age of Onset   Heart disease Mother    Heart disease Father    Diabetes Father    Skin cancer Father    Valvular heart disease Brother    Heart disease Paternal Grandmother    Heart disease Paternal Grandfather    Chiari malformation Daughter    Other Daughter        Noonan syndrome   Colon cancer Neg Hx    Stomach cancer Neg Hx    Rectal cancer Neg Hx    Esophageal cancer Neg Hx     SOCIAL HISTORY: Social History   Socioeconomic History   Marital status: Married    Spouse name: Jenel Lucks   Number of children: 2   Years of education: 16   Highest education level: Not on file  Occupational History   Occupation: Corporate investment banker    Comment: [place] [employer]  Tobacco Use   Smoking status: Never   Smokeless tobacco: Never  Vaping Use   Vaping status: Never Used  Substance and Sexual Activity   Alcohol use: Yes    Alcohol/week: 0.0 standard drinks of alcohol    Comment: 2 per month   Drug use: No   Sexual activity: Not on file  Other Topics Concern   Not on file  Social History Narrative  Lives with wife    Caffeine use- coffee 2 cups daily   Social Determinants of Health   Financial Resource Strain: Not on file  Food Insecurity: Not on file  Transportation Needs: Not on file  Physical Activity: Not on file  Stress: Not on file  Social Connections: Not on file  Intimate Partner Violence: Not on file      PHYSICAL EXAM  Vitals:   06/30/23 0911  BP: 112/68  Pulse: (!) 54  Weight: 263 lb 6.4 oz (119.5 kg)  Height: 5\' 10"  (1.778 m)     Body mass index is 37.79 kg/m.  Generalized: Well developed, in no acute distress  Cardiology: normal rate and rhythm, no murmur noted Respiratory: clear to auscultation bilaterally  Neurological examination  Mentation: Alert oriented to time, place, history taking. Follows all commands speech and language fluent Cranial nerve II-XII: Pupils were equal round reactive to light. Extraocular movements were full, visual field were full  Motor: The motor testing reveals 5 over 5 strength of all 4 extremities. Good symmetric motor tone is noted throughout.  Sensory: Sensory testing is intact to soft touch on all 4 extremities. No evidence of extinction is noted.   Gait and station: Gait is normal.   DIAGNOSTIC DATA (LABS, IMAGING, TESTING) - I reviewed patient records, labs, notes, testing and imaging myself where available.      No data to display           Lab Results  Component Value Date   WBC 7.3 10/18/2022   HGB 12.0 (L) 10/18/2022   HCT 37.2 (L) 10/18/2022   MCV 80.2 10/18/2022   PLT 295 10/18/2022      Component Value Date/Time   NA 142 10/18/2022 1903   NA 142 11/09/2018 1013   K 3.7 10/18/2022 1903   CL 107 10/18/2022 1903   CO2 24 10/18/2022 1903   GLUCOSE 127 (H) 10/18/2022 1903   BUN 16 10/18/2022 1903   BUN 13 11/09/2018 1013   CREATININE 1.11 10/18/2022 1903   CALCIUM 9.3 10/18/2022 1903   PROT 7.2 10/18/2022 1903   ALBUMIN 4.3 10/18/2022 1903   AST 16 10/18/2022 1903   ALT 16 10/18/2022 1903   ALKPHOS 84  10/18/2022 1903   BILITOT 0.3 10/18/2022 1903   GFRNONAA >60 10/18/2022 1903   GFRAA >60 06/01/2020 1553   No results found for: "CHOL", "HDL", "LDLCALC", "LDLDIRECT", "TRIG", "CHOLHDL" No results found for: "HGBA1C" No results found for: "VITAMINB12" No results found for: "TSH"     ASSESSMENT AND PLAN 62 y.o. year old male  has a past medical history of Allergy, Asthma, Barrett's esophagus, BPH (benign prostatic hyperplasia), Fibromyalgia, GERD (gastroesophageal reflux disease), History of hiatal hernia, History of kidney stones, Hyperlipidemia, Hypertension, Kidney stones, Peripheral neuropathy, Sleep apnea, and Small fiber neuropathy. here with     ICD-10-CM   1. Other polyneuropathy  G62.89 DULoxetine (CYMBALTA) 60 MG capsule      Juandiego is doing well today.  We will continue duloxetine 60 mg and ALA 600mg  daily. He will continue close follow-up with primary care for management of comorbidities.  Regular exercise and well-balanced diet advised.  He will follow-up with me in 1 year, sooner if needed.  He verbalizes understanding and agreement with this plan.  No orders of the defined types were placed in this encounter.    Meds ordered this encounter  Medications   DULoxetine (CYMBALTA) 60 MG capsule    Sig: Take 1 capsule (60 mg total)  by mouth daily.    Dispense:  90 capsule    Refill:  4    Order Specific Question:   Supervising Provider    Answer:   Bernestine Amass, FNP-C 06/30/2023, 9:38 AM [facility] [address], [address] [place], [place] [address] [phone]

## 2023-06-30 ENCOUNTER — Ambulatory Visit (INDEPENDENT_AMBULATORY_CARE_PROVIDER_SITE_OTHER): Payer: BC Managed Care – PPO | Admitting: Family Medicine

## 2023-06-30 ENCOUNTER — Encounter: Payer: Self-pay | Admitting: Family Medicine

## 2023-06-30 DIAGNOSIS — G6289 Other specified polyneuropathies: Secondary | ICD-10-CM | POA: Diagnosis not present

## 2023-06-30 MED ORDER — DULOXETINE HCL 60 MG PO CPEP: 60.0000 mg | ORAL_CAPSULE | Freq: Every day | ORAL | 4 refills | Status: DC

## 2023-09-05 ENCOUNTER — Telehealth: Payer: Self-pay | Admitting: Gastroenterology

## 2023-09-05 NOTE — Telephone Encounter (Signed)
Patient is interested in repair of the large hiatal hernia. Does he need to be seen for a referral? He also mentions he has anemia. PCP recently did labs because of the patient's headaches. PCP is [facility] physician.

## 2023-09-05 NOTE — Telephone Encounter (Signed)
Received MyChart message from patient requesting an OV to review the need for surgery to address hiatal hernia and lesions.  He stated if it was needed he wanted to try to get it done this year.  Please call patient and advise.  Thank you.

## 2023-09-10 ENCOUNTER — Other Ambulatory Visit: Payer: Self-pay

## 2023-09-10 DIAGNOSIS — R1013 Epigastric pain: Secondary | ICD-10-CM

## 2023-09-10 DIAGNOSIS — K449 Diaphragmatic hernia without obstruction or gangrene: Secondary | ICD-10-CM

## 2023-09-10 DIAGNOSIS — K219 Gastro-esophageal reflux disease without esophagitis: Secondary | ICD-10-CM

## 2023-09-10 DIAGNOSIS — Z8719 Personal history of other diseases of the digestive system: Secondary | ICD-10-CM

## 2023-09-10 NOTE — Telephone Encounter (Signed)
Left message for the patient to call us back.  Referral for Dr Cliffton Asters entered. CT of the chest wo contrast sent in to [facility]. [facility]. Esophageal manometry 10/01/23 at 8:30 am.

## 2023-09-10 NOTE — Telephone Encounter (Signed)
Please send referral to Dr.Lightfoot for hernia repair. He needs CT chest and esophageal manometry, will be nice if we can bring him in a for a visit to discuss but dont want to delay if our office schedule is full

## 2023-09-11 NOTE — Telephone Encounter (Signed)
Patient advised of the plans and appointments.

## 2023-09-11 NOTE — Telephone Encounter (Signed)
Called the patient. No answer. Left message asking he or his spouse call me back.

## 2023-09-16 ENCOUNTER — Other Ambulatory Visit: Payer: BC Managed Care – PPO

## 2023-09-18 ENCOUNTER — Telehealth: Payer: Self-pay | Admitting: Gastroenterology

## 2023-09-18 NOTE — Telephone Encounter (Signed)
Inbound call from patient stating he missed CT scan due to writing down wrong date. States he is working on getting it rescheduled. Patient is also requesting to know if he needs lab work done prior to procedure. Also would like to know if he is okay to go ahead and get his flu shot. Patient requesting a call back. Please advise, thank you.

## 2023-09-19 ENCOUNTER — Other Ambulatory Visit: Payer: Self-pay

## 2023-09-19 MED ORDER — OMEPRAZOLE 40 MG PO CPDR: 40.0000 mg | DELAYED_RELEASE_CAPSULE | Freq: Two times a day (BID) | ORAL | 6 refills | Status: DC

## 2023-09-19 NOTE — Telephone Encounter (Signed)
PT returning call to discuss his concerns. Please advise.

## 2023-09-19 NOTE — Telephone Encounter (Signed)
I cannot review his CBC from [facility] office, will try to get the report on Monday. He has had mild anemia, did patient have any significant drop in Hgb that he was informed by PCP?

## 2023-09-19 NOTE — Telephone Encounter (Signed)
Spoke with patient. CT is rescheduled. Advised he is okay to be vaccinated against the flu. The patient is asking if he needs labs. He is specifically concerned about his CBC that was done by Dr Kateri Plummer of [place] in July. Please advise.

## 2023-09-19 NOTE — Telephone Encounter (Signed)
I can get the labs. Hgb 12.2 hct 37.8

## 2023-09-22 NOTE — Telephone Encounter (Signed)
I put copies of the labs he is referring to on your desk.

## 2023-09-22 NOTE — Telephone Encounter (Signed)
Reviewed labs, CMP is within normal limits at baseline.  Hemoglobin is 12.2 and hematocrit 37.8, is baseline for patient.  Platelets 276 and normal WBC count 6.1.  Iron panel with slightly low iron saturation of 13%, normal serum iron level.  He is scheduled for procedures at the hospital next week.  I will discuss with patient when he comes in for his procedure.  We can hold off rechecking the labs as these were done within the past 3 months unless patient has developed any new symptoms or has additional concerns

## 2023-09-23 ENCOUNTER — Other Ambulatory Visit: Payer: Self-pay

## 2023-09-23 ENCOUNTER — Ambulatory Visit
Admission: RE | Admit: 2023-09-23 | Discharge: 2023-09-23 | Disposition: A | Discharge status: Home / Self Care | Payer: BC Managed Care – PPO | Source: Ambulatory Visit | Attending: Gastroenterology | Admitting: Gastroenterology

## 2023-09-23 DIAGNOSIS — R1013 Epigastric pain: Secondary | ICD-10-CM | POA: Diagnosis not present

## 2023-09-23 DIAGNOSIS — K449 Diaphragmatic hernia without obstruction or gangrene: Secondary | ICD-10-CM

## 2023-09-23 DIAGNOSIS — R42 Dizziness and giddiness: Secondary | ICD-10-CM

## 2023-09-23 DIAGNOSIS — R0609 Other forms of dyspnea: Secondary | ICD-10-CM

## 2023-09-23 DIAGNOSIS — K219 Gastro-esophageal reflux disease without esophagitis: Secondary | ICD-10-CM

## 2023-09-23 NOTE — Telephone Encounter (Signed)
He should see cardiology for an urgent visit to exclude any cardiac etiology for exertional dyspnea and dizziness. Please send urgent referral

## 2023-09-23 NOTE — Telephone Encounter (Signed)
Spoke with patient. He will have an esophageal manometry on 10/01/23. He reports feeling lightheaded if he exerts himself. Also reports headaches.

## 2023-09-23 NOTE — Telephone Encounter (Signed)
Patient advised. He agrees to this plan. He has seen Dr Anne Fu in the past. Referral placed as urgent.

## 2023-10-01 ENCOUNTER — Encounter (HOSPITAL_COMMUNITY)
Admission: RE | Disposition: A | Discharge status: Home / Self Care | Payer: Self-pay | Source: Home / Self Care | Attending: Gastroenterology

## 2023-10-01 ENCOUNTER — Ambulatory Visit (HOSPITAL_COMMUNITY)
Admission: RE | Admit: 2023-10-01 | Discharge: 2023-10-01 | Disposition: A | Discharge status: Home / Self Care | Payer: BC Managed Care – PPO | Attending: Gastroenterology | Admitting: Gastroenterology

## 2023-10-01 ENCOUNTER — Encounter (HOSPITAL_COMMUNITY): Payer: Self-pay | Admitting: Gastroenterology

## 2023-10-01 DIAGNOSIS — R1310 Dysphagia, unspecified: Secondary | ICD-10-CM

## 2023-10-01 DIAGNOSIS — K219 Gastro-esophageal reflux disease without esophagitis: Secondary | ICD-10-CM

## 2023-10-01 DIAGNOSIS — K2289 Other specified disease of esophagus: Secondary | ICD-10-CM | POA: Insufficient documentation

## 2023-10-01 DIAGNOSIS — K224 Dyskinesia of esophagus: Secondary | ICD-10-CM | POA: Diagnosis not present

## 2023-10-01 HISTORY — PX: ESOPHAGEAL MANOMETRY: SHX5429

## 2023-10-01 SURGERY — MANOMETRY, ESOPHAGUS

## 2023-10-01 MED ORDER — LIDOCAINE VISCOUS HCL 2 % MT SOLN
OROMUCOSAL | Status: AC
  Filled 2023-10-01: qty 15

## 2023-10-01 SURGICAL SUPPLY — 2 items
FACESHIELD LNG OPTICON STERILE (SAFETY) IMPLANT
GLOVE BIO SURGEON STRL SZ8 (GLOVE) ×2 IMPLANT

## 2023-10-01 NOTE — Progress Notes (Signed)
Esophageal manometry performed per protocol.  Patient tolerated well.

## 2023-10-02 ENCOUNTER — Encounter: Payer: Self-pay | Admitting: Physician Assistant

## 2023-10-02 ENCOUNTER — Ambulatory Visit: Payer: BC Managed Care – PPO | Attending: Physician Assistant | Admitting: Physician Assistant

## 2023-10-02 VITALS — BP 120/62 | HR 54 | Ht 70.0 in | Wt 268.0 lb

## 2023-10-02 DIAGNOSIS — R42 Dizziness and giddiness: Secondary | ICD-10-CM | POA: Diagnosis not present

## 2023-10-02 DIAGNOSIS — I10 Essential (primary) hypertension: Secondary | ICD-10-CM | POA: Diagnosis not present

## 2023-10-02 NOTE — Patient Instructions (Signed)
Medication Instructions:  NO CHANGES *If you need a refill on your cardiac medications before your next appointment, please call your pharmacy*   Lab Work: NO LABS If you have labs (blood work) drawn today and your tests are completely normal, you will receive your results only by: MyChart Message (if you have MyChart) OR A paper copy in the mail If you have any lab test that is abnormal or we need to change your treatment, we will call you to review the results.   Testing/Procedures: NO TESTING   Follow-Up: At [facility], you and your health needs are our priority.  As part of our continuing mission to provide you with exceptional heart care, we have created designated Provider Care Teams.  These Care Teams include your primary Cardiologist (physician) and Advanced Practice Providers (APPs -  Physician Assistants and Nurse Practitioners) who all work together to provide you with the care you need, when you need it.   Your next appointment:   6 month(s)  Provider:   Donato Schultz, MD   Other Instructions ADEQUATE FLUID INTAKE 32-64 OZ PER DAY.

## 2023-10-02 NOTE — Progress Notes (Signed)
Cardiology Office Note:  .   Date:  10/02/2023  ID:  Timothy [name], DOB 18-Jul-1961, MRN [id number] PCP: Farris Has, MD  [facility] Providers Cardiologist:  Donato Schultz, MD     History of Present Illness: .   Timothy [name] is a 62 y.o. male with PMH of HTN, HLD, large hiatal hernia, fibromyalgia, OSA on CPAP and history of COVID infection in May 2022.  Previous coronary CT obtained on 11/10/2018 demonstrated coronary calcium score of 1.5 which placed the patient at 36 percentile for age and sex matched control, minimal plaque.  Patient was last seen by Dr. Anne Fu on 08/24/2021 at which time he was doing well.  He was given gabapentin for peripheral neuropathy.  He underwent esophageal manometry by GI service on 10/01/2023.  Based on phone message from 10/29, patient reported to his GI provider that he has been having lightheadedness with exertion, he was subsequently referred to cardiology service for further evaluation.  Last hemoglobin on 06/20/2023 was 12.2.  White blood cell count normal.  Per patient, he did have some hemoglobin that was low in July.  I am unable to see all of the blood work from GI doctor's office.  Talking with the patient, he really does not have any shortness of breath.  He denies any exertional chest pain.  His work is not strenuous however he usually walk on concrete floor for long period of time.  He has no problem walking long distance during work.  His primary issue is dizziness and headache.  He is dizziness only occurs when he changed body position such as sitting up, standing or occurs immediately after he bent over and try to get up.  Dizziness is consistent with orthostatic dizziness.  He is on Ziac (bisoprolol-HCTZ) and Cardura.  Blood pressure was 120/62 today.  Orthostatic vital sign in the office was negative.  I offered to take off HCTZ portion of the Ziac.  I suspect he is not drinking enough fluid.  I recommended 32 to 64 ounces of fluid per day.  Overall, he  is doing well from the cardiac perspective and can follow-up in 27-month.  Lying 118/72 heart rate 58 Sitting 119/63 heart rate 52 Standing 0-minute 117/65 heart rate 56 Standing 3-minute 124/71 heart rate 55.    ROS:   Patient complains of dizziness when he changed body positions but denies any chest pain or shortness of breath.  Studies Reviewed: Marland Kitchen   EKG Interpretation Date/Time:  Thursday October 02 2023 09:59:56 EST Ventricular Rate:  54 PR Interval:  126 QRS Duration:  86 QT Interval:  424 QTC Calculation: 402 R Axis:   26  Text Interpretation: Sinus bradycardia When compared with ECG of 01-Jun-2020 15:44, No significant change was found Confirmed by Azalee Course [phone]) on 10/02/2023 10:08:58 AM     Risk Assessment/Calculations:             Physical Exam:   VS:  BP 120/62 (BP Location: Right Arm, Patient Position: Sitting, Cuff Size: Large)   Pulse (!) 54   Ht 5\' 10"  (1.778 m)   Wt 268 lb (121.6 kg)   BMI 38.45 kg/m    Wt Readings from Last 3 Encounters:  10/02/23 268 lb (121.6 kg)  06/30/23 263 lb 6.4 oz (119.5 kg)  11/27/22 283 lb (128.4 kg)    GEN: Well nourished, well developed in no acute distress NECK: No JVD; No carotid bruits CARDIAC: RRR, no murmurs, rubs, gallops RESPIRATORY:  Clear to auscultation without  rales, wheezing or rhonchi  ABDOMEN: Soft, non-tender, non-distended EXTREMITIES:  No edema; No deformity   ASSESSMENT AND PLAN: .     Orthostatic Dizziness Dizziness and headaches for the past 4 months, primarily with position changes. No significant drop in blood pressure noted during orthostatic vital signs. Patient is on bisoprolol-hydrochlorothiazide and Cardura for many years. -Encourage increased hydration, aiming for 32-64 ounces of fluid per day. -Consider removing hydrochlorothiazide portion of bisoprolol-hydrochlorothiazide if symptoms worsen.  Hypertension: Continue on current therapy, blood pressure well-controlled.          Dispo: Follow-up in 77-month  Signed, Azalee Course, [place]

## 2023-10-03 ENCOUNTER — Encounter (HOSPITAL_COMMUNITY): Payer: Self-pay | Admitting: Gastroenterology

## 2023-10-10 ENCOUNTER — Encounter: Payer: Self-pay | Admitting: Thoracic Surgery (Cardiothoracic Vascular Surgery)

## 2023-10-10 ENCOUNTER — Other Ambulatory Visit: Payer: Self-pay | Admitting: *Deleted

## 2023-10-10 ENCOUNTER — Institutional Professional Consult (permissible substitution) (INDEPENDENT_AMBULATORY_CARE_PROVIDER_SITE_OTHER): Payer: BC Managed Care – PPO | Admitting: Thoracic Surgery (Cardiothoracic Vascular Surgery)

## 2023-10-10 ENCOUNTER — Encounter: Payer: Self-pay | Admitting: *Deleted

## 2023-10-10 VITALS — BP 126/74 | HR 51 | Resp 20 | Ht 70.0 in | Wt 265.0 lb

## 2023-10-10 DIAGNOSIS — K449 Diaphragmatic hernia without obstruction or gangrene: Secondary | ICD-10-CM | POA: Diagnosis not present

## 2023-10-10 NOTE — H&P (View-Only) (Signed)
 [address]       [place] [address]             [phone]                    Timothy [name] [facility] Medical Record #[id number] Date of Birth: Jul 26, 1961  Referring: Timothy Form, MD Primary Care: Timothy Has, MD Primary Cardiologist: Timothy Schultz, MD  Chief Complaint:    Chief Complaint  Patient presents with   Hiatal Hernia    New patient consultation,     History of Present Illness:    Timothy [name] 62 y.o. male presents for surgical evaluation of the paraesophageal hernia.  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  He denies any dysphagia, but [name] been on Prilosec for several years.  He [name] been having some lightheadedness, as well as headaches.     Zubrod Score: At the time of surgery this patient's most appropriate activity status/level should be described as: [x]     0    Normal activity, no symptoms []     1    Restricted in physical strenuous activity but ambulatory, able to do out light work []     2    Ambulatory and capable of self care, unable to do work activities, up and about               >50 % of waking hours                              []     3    Only limited self care, in bed greater than 50% of waking hours []     4    Completely disabled, no self care, confined to bed or chair []     5    Moribund   Past Medical History:  Diagnosis Date   Allergy    Asthma    Barrett's esophagus    BPH (benign prostatic hyperplasia)    Fibromyalgia    GERD (gastroesophageal reflux disease)    History of hiatal hernia    History of kidney stones    Hyperlipidemia    Hypertension    Kidney stones    Peripheral neuropathy    Sleep apnea    CPAP 100%   Small fiber neuropathy     Past Surgical History:  Procedure Laterality Date   arm  surgery Right 2005   ESOPHAGEAL MANOMETRY N/A 10/01/2023   Procedure: ESOPHAGEAL MANOMETRY (EM);  Surgeon: Timothy Form, MD;  Location: [facility];  Service: Gastroenterology;  Laterality: N/A;   KNEE SURGERY Bilateral 1976   LIPOMA EXCISION N/A 11/06/2016   Procedure: EXCISION LIPOMA ABDOMINAL WALL;  Surgeon: Berna Bue, MD;  Location: [facility];  Service: General;  Laterality: N/A;   NASAL SINUS SURGERY  2001   tumor 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 Right    2001-2002    Family History  Problem Relation Age of Onset   Heart disease Mother    Heart disease Father    Diabetes Father    Skin cancer Father    Valvular heart disease Brother    Heart disease Paternal Grandmother    Heart disease Paternal Grandfather  Chiari malformation Daughter    Other Daughter        Noonan syndrome   Colon cancer Neg Hx    Stomach cancer Neg Hx    Rectal cancer Neg Hx    Esophageal cancer Neg Hx      Social History   Tobacco Use  Smoking Status Never  Smokeless Tobacco Never    Social History   Substance and Sexual Activity  Alcohol Use Yes   Alcohol/week: 0.0 standard drinks of alcohol   Comment: 2 per month     No Known Allergies  Current Outpatient Medications  Medication Sig Dispense Refill   ALPHA LIPOIC ACID PO Take by mouth.     aspirin EC 81 MG tablet Take 81 mg by mouth daily.     bisoprolol-hydrochlorothiazide (ZIAC) 5-6.25 MG tablet TAKE 1 TABLET DAILY. Please make overdue appt with Dr. Anne Fu before anymore refills. 2nd attempt 15 tablet 0   cetirizine (ZYRTEC) 10 MG tablet Take 10 mg by mouth daily as needed for allergies.      doxazosin (CARDURA) 4 MG tablet Take 4 mg by mouth daily.     DULoxetine (CYMBALTA) 60 MG capsule Take 1 capsule (60 mg total) by mouth daily. 90 capsule 4   metFORMIN (GLUCOPHAGE) 500 MG tablet Take 500 mg by mouth 2 (two) times daily with a meal.     MISC NATURAL PRODUCTS PO Take by mouth. GOLO supplement     Multiple Vitamin  (MULTIVITAMIN) tablet Take 1 tablet by mouth daily.     Naproxen Sodium (ALEVE PO) Take 1-2 tablets by mouth 2 (two) times daily as needed. As needed     omeprazole (PRILOSEC) 40 MG capsule Take 1 capsule (40 mg total) by mouth 2 (two) times daily before a meal. 180 capsule 6   pravastatin (PRAVACHOL) 80 MG tablet Take 80 mg by mouth daily.     VITAMIN D, CHOLECALCIFEROL, PO Take by mouth.     Zinc 50 MG TABS Take by mouth.     No current facility-administered medications for this visit.    Review of Systems  Constitutional:  Positive for malaise/fatigue.  Respiratory:  Negative for shortness of breath.   Cardiovascular:  Negative for chest pain.  Gastrointestinal:  Positive for heartburn. Negative for abdominal pain, nausea and vomiting.  Neurological:  Positive for dizziness and headaches.     PHYSICAL EXAMINATION: BP 126/74 (BP Location: Right Arm, Patient Position: Sitting, Cuff Size: Large)   Pulse (!) 51   Resp 20   Ht 5\' 10"  (1.778 m)   Wt 265 lb (120.2 kg)   SpO2 95% Comment: RA  BMI 38.02 kg/m  Physical Exam Constitutional:      General: He is not in acute distress.    Appearance: Normal appearance. He is not ill-appearing.  HENT:     Head: Normocephalic and atraumatic.  Eyes:     Extraocular Movements: Extraocular movements intact.  Cardiovascular:     Rate and Rhythm: Bradycardia present.  Pulmonary:     Effort: Pulmonary effort is normal.  Abdominal:     General: Abdomen is flat. There is no distension.  Musculoskeletal:        General: Normal range of motion.     Cervical back: Normal range of motion.  Skin:    General: Skin is warm and dry.  Neurological:     General: No focal deficit present.     Mental Status: He is alert and oriented to person, place, and time.  Diagnostic Studies & Laboratory data:    CT Scan:    EGD/EUS:  - LA Grade B reflux esophagitis with no bleeding. - Bilious gastric fluid. - Large hiatal hernia with a few Cameron  ulcers. Biopsied.        I have independently reviewed the above radiology studies  and reviewed the findings with the patient.   Recent Lab Findings: Lab Results  Component Value Date   WBC 7.3 10/18/2022   HGB 12.0 (L) 10/18/2022   HCT 37.2 (L) 10/18/2022   PLT 295 10/18/2022   GLUCOSE 127 (H) 10/18/2022   ALT 16 10/18/2022   AST 16 10/18/2022   NA 142 10/18/2022   K 3.7 10/18/2022   CL 107 10/18/2022   CREATININE 1.11 10/18/2022   BUN 16 10/18/2022   CO2 24 10/18/2022       Assessment / Plan:   62 year old male with a large paraesophageal hernia.  He also [name] a history of Cameron's ulcers as well as anemia.  We discussed the risks and benefits of EGD, with robotic assisted paraesophageal hernia repair.  Given his weight he is at a slightly higher risk of recurrence, but given his history of GI bleed due to the Cameron's ulcers I think that repair is warranted.  He is agreeable to proceed.     I  spent 40 minutes with the patient face to face counseling and coordination of care.    Corliss Skains 10/10/2023 3:46 PM

## 2023-10-10 NOTE — Progress Notes (Signed)
[address]       [place] [address]             [phone]                    Timothy [name] [facility] Medical Record #[id number] Date of Birth: Jul 26, 1961  Referring: Napoleon Form, MD Primary Care: Farris Has, MD Primary Cardiologist: Donato Schultz, MD  Chief Complaint:    Chief Complaint  Patient presents with   Hiatal Hernia    New patient consultation,     History of Present Illness:    Timothy [name] 62 y.o. male presents for surgical evaluation of the paraesophageal hernia.  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  He denies any dysphagia, but has been on Prilosec for several years.  He has been having some lightheadedness, as well as headaches.     Zubrod Score: At the time of surgery this patient's most appropriate activity status/level should be described as: [x]     0    Normal activity, no symptoms []     1    Restricted in physical strenuous activity but ambulatory, able to do out light work []     2    Ambulatory and capable of self care, unable to do work activities, up and about               >50 % of waking hours                              []     3    Only limited self care, in bed greater than 50% of waking hours []     4    Completely disabled, no self care, confined to bed or chair []     5    Moribund   Past Medical History:  Diagnosis Date   Allergy    Asthma    Barrett's esophagus    BPH (benign prostatic hyperplasia)    Fibromyalgia    GERD (gastroesophageal reflux disease)    History of hiatal hernia    History of kidney stones    Hyperlipidemia    Hypertension    Kidney stones    Peripheral neuropathy    Sleep apnea    CPAP 100%   Small fiber neuropathy     Past Surgical History:  Procedure Laterality Date   arm  surgery Right 2005   ESOPHAGEAL MANOMETRY N/A 10/01/2023   Procedure: ESOPHAGEAL MANOMETRY (EM);  Surgeon: Napoleon Form, MD;  Location: [facility];  Service: Gastroenterology;  Laterality: N/A;   KNEE SURGERY Bilateral 1976   LIPOMA EXCISION N/A 11/06/2016   Procedure: EXCISION LIPOMA ABDOMINAL WALL;  Surgeon: Berna Bue, MD;  Location: [facility];  Service: General;  Laterality: N/A;   NASAL SINUS SURGERY  2001   tumor 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 Right    2001-2002    Family History  Problem Relation Age of Onset   Heart disease Mother    Heart disease Father    Diabetes Father    Skin cancer Father    Valvular heart disease Brother    Heart disease Paternal Grandmother    Heart disease Paternal Grandfather  Chiari malformation Daughter    Other Daughter        Noonan syndrome   Colon cancer Neg Hx    Stomach cancer Neg Hx    Rectal cancer Neg Hx    Esophageal cancer Neg Hx      Social History   Tobacco Use  Smoking Status Never  Smokeless Tobacco Never    Social History   Substance and Sexual Activity  Alcohol Use Yes   Alcohol/week: 0.0 standard drinks of alcohol   Comment: 2 per month     No Known Allergies  Current Outpatient Medications  Medication Sig Dispense Refill   ALPHA LIPOIC ACID PO Take by mouth.     aspirin EC 81 MG tablet Take 81 mg by mouth daily.     bisoprolol-hydrochlorothiazide (ZIAC) 5-6.25 MG tablet TAKE 1 TABLET DAILY. Please make overdue appt with Dr. Anne Fu before anymore refills. 2nd attempt 15 tablet 0   cetirizine (ZYRTEC) 10 MG tablet Take 10 mg by mouth daily as needed for allergies.      doxazosin (CARDURA) 4 MG tablet Take 4 mg by mouth daily.     DULoxetine (CYMBALTA) 60 MG capsule Take 1 capsule (60 mg total) by mouth daily. 90 capsule 4   metFORMIN (GLUCOPHAGE) 500 MG tablet Take 500 mg by mouth 2 (two) times daily with a meal.     MISC NATURAL PRODUCTS PO Take by mouth. GOLO supplement     Multiple Vitamin  (MULTIVITAMIN) tablet Take 1 tablet by mouth daily.     Naproxen Sodium (ALEVE PO) Take 1-2 tablets by mouth 2 (two) times daily as needed. As needed     omeprazole (PRILOSEC) 40 MG capsule Take 1 capsule (40 mg total) by mouth 2 (two) times daily before a meal. 180 capsule 6   pravastatin (PRAVACHOL) 80 MG tablet Take 80 mg by mouth daily.     VITAMIN D, CHOLECALCIFEROL, PO Take by mouth.     Zinc 50 MG TABS Take by mouth.     No current facility-administered medications for this visit.    Review of Systems  Constitutional:  Positive for malaise/fatigue.  Respiratory:  Negative for shortness of breath.   Cardiovascular:  Negative for chest pain.  Gastrointestinal:  Positive for heartburn. Negative for abdominal pain, nausea and vomiting.  Neurological:  Positive for dizziness and headaches.     PHYSICAL EXAMINATION: BP 126/74 (BP Location: Right Arm, Patient Position: Sitting, Cuff Size: Large)   Pulse (!) 51   Resp 20   Ht 5\' 10"  (1.778 m)   Wt 265 lb (120.2 kg)   SpO2 95% Comment: RA  BMI 38.02 kg/m  Physical Exam Constitutional:      General: He is not in acute distress.    Appearance: Normal appearance. He is not ill-appearing.  HENT:     Head: Normocephalic and atraumatic.  Eyes:     Extraocular Movements: Extraocular movements intact.  Cardiovascular:     Rate and Rhythm: Bradycardia present.  Pulmonary:     Effort: Pulmonary effort is normal.  Abdominal:     General: Abdomen is flat. There is no distension.  Musculoskeletal:        General: Normal range of motion.     Cervical back: Normal range of motion.  Skin:    General: Skin is warm and dry.  Neurological:     General: No focal deficit present.     Mental Status: He is alert and oriented to person, place, and time.  Diagnostic Studies & Laboratory data:    CT Scan:    EGD/EUS:  - LA Grade B reflux esophagitis with no bleeding. - Bilious gastric fluid. - Large hiatal hernia with a few Cameron  ulcers. Biopsied.        I have independently reviewed the above radiology studies  and reviewed the findings with the patient.   Recent Lab Findings: Lab Results  Component Value Date   WBC 7.3 10/18/2022   HGB 12.0 (L) 10/18/2022   HCT 37.2 (L) 10/18/2022   PLT 295 10/18/2022   GLUCOSE 127 (H) 10/18/2022   ALT 16 10/18/2022   AST 16 10/18/2022   NA 142 10/18/2022   K 3.7 10/18/2022   CL 107 10/18/2022   CREATININE 1.11 10/18/2022   BUN 16 10/18/2022   CO2 24 10/18/2022       Assessment / Plan:   62 year old male with a large paraesophageal hernia.  He also has a history of Cameron's ulcers as well as anemia.  We discussed the risks and benefits of EGD, with robotic assisted paraesophageal hernia repair.  Given his weight he is at a slightly higher risk of recurrence, but given his history of GI bleed due to the Cameron's ulcers I think that repair is warranted.  He is agreeable to proceed.     I  spent 40 minutes with the patient face to face counseling and coordination of care.    Corliss Skains 10/10/2023 3:46 PM

## 2023-10-27 NOTE — Pre-Procedure Instructions (Signed)
Surgical Instructions   Your procedure is scheduled on Thursday, December 5th. Report to [facility] Main Entrance "A" at 10:20 A.M., then check in with the Admitting office. Any questions or running late day of surgery: call [phone]  Questions prior to your surgery date: call [phone], Monday-Friday, 8am-4pm. If you experience any cold or flu symptoms such as cough, fever, chills, shortness of breath, etc. between now and your scheduled surgery, please notify us at the above number.     Remember:  Do not eat or drink after midnight the night before your surgery    Take these medicines the morning of surgery with A SIP OF WATER  cetirizine (ZYRTEC)  doxazosin (CARDURA)  DULoxetine (CYMBALTA)  omeprazole (PRILOSEC)  pravastatin (PRAVACHOL)    May take these medicines IF NEEDED: acetaminophen (TYLENOL)    One week prior to surgery, STOP taking any Aleve, Naproxen, Ibuprofen, Motrin, Advil, Goody's, BC's, all herbal medications, fish oil, and non-prescription vitamins.   WHAT DO I DO ABOUT MY DIABETES MEDICATION?   Do not take metFORMIN (GLUCOPHAGE) the morning of surgery.    HOW TO MANAGE YOUR DIABETES BEFORE AND AFTER SURGERY  Why is it important to control my blood sugar before and after surgery? Improving blood sugar levels before and after surgery helps healing and can limit problems. A way of improving blood sugar control is eating a healthy diet by:  Eating less sugar and carbohydrates  Increasing activity/exercise  Talking with your doctor about reaching your blood sugar goals High blood sugars (greater than 180 mg/dL) can raise your risk of infections and slow your recovery, so you will need to focus on controlling your diabetes during the weeks before surgery. Make sure that the doctor who takes care of your diabetes knows about your planned surgery including the date and location.  How do I manage my blood sugar before surgery? Check your blood sugar  at least 4 times a day, starting 2 days before surgery, to make sure that the level is not too high or low.  Check your blood sugar the morning of your surgery when you wake up and every 2 hours until you get to the Short Stay unit.  If your blood sugar is less than 70 mg/dL, you will need to treat for low blood sugar: Do not take insulin. Treat a low blood sugar (less than 70 mg/dL) with  cup of clear juice (cranberry or apple), 4 glucose tablets, OR glucose gel. Recheck blood sugar in 15 minutes after treatment (to make sure it is greater than 70 mg/dL). If your blood sugar is not greater than 70 mg/dL on recheck, call [phone] for further instructions. Report your blood sugar to the short stay nurse when you get to Short Stay.  If you are admitted to the hospital after surgery: Your blood sugar will be checked by the staff and you will probably be given insulin after surgery (instead of oral diabetes medicines) to make sure you have good blood sugar levels. The goal for blood sugar control after surgery is 80-180 mg/dL.                     Do NOT Smoke (Tobacco/Vaping) for 24 hours prior to your procedure.  If you use a CPAP at night, you may bring your mask/headgear for your overnight stay.   You will be asked to remove any contacts, glasses, piercing's, hearing aid's, dentures/partials prior to surgery. Please bring cases for these items if needed.  Patients discharged the day of surgery will not be allowed to drive home, and someone needs to stay with them for 24 hours.  SURGICAL WAITING ROOM VISITATION Patients may have no more than 2 support people in the waiting area - these visitors may rotate.   Pre-op nurse will coordinate an appropriate time for 1 ADULT support person, who may not rotate, to accompany patient in pre-op.  Children under the age of 26 must have an adult with them who is not the patient and must remain in the main waiting area with an adult.  If the  patient needs to stay at the hospital during part of their recovery, the visitor guidelines for inpatient rooms apply.  Please refer to the [facility] website for the visitor guidelines for any additional information.   If you received a COVID test during your pre-op visit  it is requested that you wear a mask when out in public, stay away from anyone that may not be feeling well and notify your surgeon if you develop symptoms. If you have been in contact with anyone that has tested positive in the last 10 days please notify you surgeon.      Pre-operative CHG Bathing Instructions   You can play a key role in reducing the risk of infection after surgery. Your skin needs to be as free of germs as possible. You can reduce the number of germs on your skin by washing with CHG (chlorhexidine gluconate) soap before surgery. CHG is an antiseptic soap that kills germs and continues to kill germs even after washing.   DO NOT use if you have an allergy to chlorhexidine/CHG or antibacterial soaps. If your skin becomes reddened or irritated, stop using the CHG and notify one of our RNs at [phone].              TAKE A SHOWER THE NIGHT BEFORE SURGERY AND THE DAY OF SURGERY    Please keep in mind the following:  DO NOT shave, including legs and underarms, 48 hours prior to surgery.   You may shave your face before/day of surgery.  Place clean sheets on your bed the night before surgery Use a clean washcloth (not used since being washed) for each shower. DO NOT sleep with pet's night before surgery.  CHG Shower Instructions:  Wash your face and private area with normal soap. If you choose to wash your hair, wash first with your normal shampoo.  After you use shampoo/soap, rinse your hair and body thoroughly to remove shampoo/soap residue.  Turn the water OFF and apply half the bottle of CHG soap to a CLEAN washcloth.  Apply CHG soap ONLY FROM YOUR NECK DOWN TO YOUR TOES (washing for 3-5 minutes)   DO NOT use CHG soap on face, private areas, open wounds, or sores.  Pay special attention to the area where your surgery is being performed.  If you are having back surgery, having someone wash your back for you may be helpful. Wait 2 minutes after CHG soap is applied, then you may rinse off the CHG soap.  Pat dry with a clean towel  Put on clean pajamas    Additional instructions for the day of surgery: DO NOT APPLY any lotions, deodorants, cologne, or perfumes.   Do not wear jewelry or makeup Do not wear nail polish, gel polish, artificial nails, or any other type of covering on natural nails (fingers and toes) Do not bring valuables to the hospital. [facility] is  not responsible for valuables/personal belongings. Put on clean/comfortable clothes.  Please brush your teeth.  Ask your nurse before applying any prescription medications to the skin.

## 2023-10-28 ENCOUNTER — Ambulatory Visit (HOSPITAL_COMMUNITY)
Admission: RE | Admit: 2023-10-28 | Discharge: 2023-10-28 | Disposition: A | Discharge status: Home / Self Care | Payer: BC Managed Care – PPO | Source: Ambulatory Visit | Attending: Thoracic Surgery (Cardiothoracic Vascular Surgery)

## 2023-10-28 ENCOUNTER — Encounter (HOSPITAL_COMMUNITY)
Admission: RE | Admit: 2023-10-28 | Discharge: 2023-10-28 | Disposition: A | Discharge status: Home / Self Care | Payer: BC Managed Care – PPO | Source: Ambulatory Visit | Attending: Thoracic Surgery (Cardiothoracic Vascular Surgery) | Admitting: Thoracic Surgery (Cardiothoracic Vascular Surgery)

## 2023-10-28 ENCOUNTER — Encounter (HOSPITAL_COMMUNITY): Payer: Self-pay

## 2023-10-28 ENCOUNTER — Other Ambulatory Visit: Payer: Self-pay

## 2023-10-28 VITALS — BP 144/58 | HR 65 | Temp 98.5°F | Resp 18 | Ht 70.0 in | Wt 266.0 lb

## 2023-10-28 DIAGNOSIS — E785 Hyperlipidemia, unspecified: Secondary | ICD-10-CM | POA: Diagnosis not present

## 2023-10-28 DIAGNOSIS — R918 Other nonspecific abnormal finding of lung field: Secondary | ICD-10-CM | POA: Diagnosis not present

## 2023-10-28 DIAGNOSIS — E66813 Obesity, class 3: Secondary | ICD-10-CM | POA: Diagnosis not present

## 2023-10-28 DIAGNOSIS — Z8249 Family history of ischemic heart disease and other diseases of the circulatory system: Secondary | ICD-10-CM | POA: Diagnosis not present

## 2023-10-28 DIAGNOSIS — E1142 Type 2 diabetes mellitus with diabetic polyneuropathy: Secondary | ICD-10-CM | POA: Diagnosis not present

## 2023-10-28 DIAGNOSIS — D72829 Elevated white blood cell count, unspecified: Secondary | ICD-10-CM | POA: Diagnosis not present

## 2023-10-28 DIAGNOSIS — Z7982 Long term (current) use of aspirin: Secondary | ICD-10-CM | POA: Diagnosis not present

## 2023-10-28 DIAGNOSIS — Z01818 Encounter for other preprocedural examination: Secondary | ICD-10-CM

## 2023-10-28 DIAGNOSIS — K219 Gastro-esophageal reflux disease without esophagitis: Secondary | ICD-10-CM | POA: Diagnosis present

## 2023-10-28 DIAGNOSIS — Z87442 Personal history of urinary calculi: Secondary | ICD-10-CM | POA: Diagnosis not present

## 2023-10-28 DIAGNOSIS — Z808 Family history of malignant neoplasm of other organs or systems: Secondary | ICD-10-CM | POA: Diagnosis not present

## 2023-10-28 DIAGNOSIS — N400 Enlarged prostate without lower urinary tract symptoms: Secondary | ICD-10-CM | POA: Diagnosis not present

## 2023-10-28 DIAGNOSIS — J45909 Unspecified asthma, uncomplicated: Secondary | ICD-10-CM | POA: Diagnosis not present

## 2023-10-28 DIAGNOSIS — Z01812 Encounter for preprocedural laboratory examination: Secondary | ICD-10-CM | POA: Insufficient documentation

## 2023-10-28 DIAGNOSIS — D179 Benign lipomatous neoplasm, unspecified: Secondary | ICD-10-CM | POA: Diagnosis not present

## 2023-10-28 DIAGNOSIS — E119 Type 2 diabetes mellitus without complications: Secondary | ICD-10-CM | POA: Diagnosis not present

## 2023-10-28 DIAGNOSIS — I10 Essential (primary) hypertension: Secondary | ICD-10-CM | POA: Diagnosis not present

## 2023-10-28 DIAGNOSIS — K449 Diaphragmatic hernia without obstruction or gangrene: Secondary | ICD-10-CM | POA: Insufficient documentation

## 2023-10-28 DIAGNOSIS — M797 Fibromyalgia: Secondary | ICD-10-CM | POA: Diagnosis not present

## 2023-10-28 DIAGNOSIS — Z6838 Body mass index (BMI) 38.0-38.9, adult: Secondary | ICD-10-CM | POA: Diagnosis not present

## 2023-10-28 DIAGNOSIS — G4730 Sleep apnea, unspecified: Secondary | ICD-10-CM | POA: Diagnosis present

## 2023-10-28 DIAGNOSIS — Z79899 Other long term (current) drug therapy: Secondary | ICD-10-CM | POA: Diagnosis not present

## 2023-10-28 DIAGNOSIS — Z0189 Encounter for other specified special examinations: Secondary | ICD-10-CM | POA: Diagnosis not present

## 2023-10-28 DIAGNOSIS — Z833 Family history of diabetes mellitus: Secondary | ICD-10-CM | POA: Diagnosis not present

## 2023-10-28 DIAGNOSIS — Z7984 Long term (current) use of oral hypoglycemic drugs: Secondary | ICD-10-CM | POA: Diagnosis not present

## 2023-10-28 HISTORY — DX: Type 2 diabetes mellitus without complications: E11.9

## 2023-10-28 LAB — GLUCOSE, CAPILLARY: Glucose-Capillary: 184 mg/dL — ABNORMAL HIGH (ref 70–99)

## 2023-10-28 LAB — COMPREHENSIVE METABOLIC PANEL
ALT: 20 U/L (ref 0–44)
AST: 24 U/L (ref 15–41)
Albumin: 3.6 g/dL (ref 3.5–5.0)
Alkaline Phosphatase: 74 U/L (ref 38–126)
Anion gap: 8 (ref 5–15)
BUN: 15 mg/dL (ref 8–23)
CO2: 26 mmol/L (ref 22–32)
Calcium: 9.4 mg/dL (ref 8.9–10.3)
Chloride: 106 mmol/L (ref 98–111)
Creatinine, Ser: 0.98 mg/dL (ref 0.61–1.24)
GFR, Estimated: >60 mL/min (ref >60)
Glucose, Bld: 75 mg/dL (ref 70–99)
Potassium: 3.9 mmol/L (ref 3.5–5.1)
Sodium: 140 mmol/L (ref 135–145)
Total Bilirubin: 0.4 mg/dL (ref <1.2)
Total Protein: 6.8 g/dL (ref 6.5–8.1)

## 2023-10-28 LAB — URINALYSIS, ROUTINE W REFLEX MICROSCOPIC
Bacteria, UA: NONE SEEN
Bilirubin Urine: NEGATIVE
Glucose, UA: NEGATIVE mg/dL
Hgb urine dipstick: NEGATIVE
Ketones, ur: NEGATIVE mg/dL
Leukocytes,Ua: NEGATIVE
Nitrite: NEGATIVE
Protein, ur: NEGATIVE mg/dL
Specific Gravity, Urine: 1.023 (ref 1.005–1.030)
pH: 5 (ref 5.0–8.0)

## 2023-10-28 LAB — SURGICAL PCR SCREEN
MRSA, PCR: NEGATIVE
Staphylococcus aureus: NEGATIVE

## 2023-10-28 LAB — PROTIME-INR
INR: 1 (ref 0.8–1.2)
Prothrombin Time: 13.1 s (ref 11.4–15.2)

## 2023-10-28 LAB — CBC
HCT: 39.9 % (ref 39.0–52.0)
Hemoglobin: 12.5 g/dL — ABNORMAL LOW (ref 13.0–17.0)
MCH: 24.4 pg — ABNORMAL LOW (ref 26.0–34.0)
MCHC: 31.3 g/dL (ref 30.0–36.0)
MCV: 77.9 fL — ABNORMAL LOW (ref 80.0–100.0)
Platelets: 273 10*3/uL (ref 150–400)
RBC: 5.12 MIL/uL (ref 4.22–5.81)
RDW: 15.2 % (ref 11.5–15.5)
WBC: 7.6 10*3/uL (ref 4.0–10.5)
nRBC: 0 % (ref 0.0–0.2)

## 2023-10-28 LAB — TYPE AND SCREEN
ABO/RH(D): A POS
Antibody Screen: NEGATIVE

## 2023-10-28 LAB — HEMOGLOBIN A1C
Hgb A1c MFr Bld: 6.1 % — ABNORMAL HIGH (ref 4.8–5.6)
Mean Plasma Glucose: 128.37 mg/dL

## 2023-10-28 LAB — APTT: aPTT: 26 s (ref 24–36)

## 2023-10-28 NOTE — Progress Notes (Addendum)
PCP - Mechele Claude Cardiologist - Louis Matte  PPM/ICD - Denies Device Orders -  Rep Notified -   Chest x-ray - 10/28/23 EKG - 10/02/23 Stress Test - 06/11/21 ECHO - 06/11/21 Cardiac Cath - denies  Sleep Study - in [place] CPAP - yes  Fasting Blood Sugar - 120's Checks Blood Sugar does not check his sugar at home and does not have a meter.  Last dose of GLP1 agonist-   GLP1 instructions:   Blood Thinner Instructions:na Aspirin Instructions:Pt stated he will call Dr. Lucilla Lame office for instructions regarding holding aspirin. Patient stated that Dr. Kateri Plummer is the prescriber.   ERAS Protcol -no PRE-SURGERY Ensure or G2-   COVID TEST- na   Anesthesia review: no  Patient denies shortness of breath, fever, cough and chest pain at PAT appointment   All instructions explained to the patient, with a verbal understanding of the material. Patient agrees to go over the instructions while at home for a better understanding. Patient also instructed to wear a mask when out in public prior to surgery. The opportunity to ask questions was provided.

## 2023-10-28 NOTE — Progress Notes (Signed)
PCP - Mechele Claude Cardiologist - Louis Matte  PPM/ICD - Denies Device Orders -  Rep Notified -   Chest x-ray - 10/28/23 EKG - 10/02/23 Stress Test - 06/11/21 ECHO - 06/11/21 Cardiac Cath - denies  Sleep Study - in [place] CPAP - yes  Fasting Blood Sugar - 120's Checks Blood Sugar does not check his sugar at home and does not have a meter.  Last dose of GLP1 agonist-   GLP1 instructions:   Blood Thinner Instructions:na Aspirin Instructions:Pt stated he will call Dr. Lucilla Lame office for instructions regarding holding aspirin. Patient stated that Dr. Kateri Plummer is the prescriber.   ERAS Protcol -no PRE-SURGERY Ensure or G2-   COVID TEST- per Gonzella Lex RN from Dr. Lucilla Lame office - "Our providers do not require covid tests, that would be up to anesthesia or hospital policy at this time." Anesthesia protocol does not require Covid testing for this surgery and neither does hospital policy, so no Covid test performed.   Anesthesia review: no  Patient denies shortness of breath, fever, cough and chest pain at PAT appointment   All instructions explained to the patient, with a verbal understanding of the material. Patient agrees to go over the instructions while at home for a better understanding. Patient also instructed to wear a mask when out in public prior to surgery. The opportunity to ask questions was provided.

## 2023-10-30 ENCOUNTER — Other Ambulatory Visit: Payer: Self-pay

## 2023-10-30 ENCOUNTER — Observation Stay (HOSPITAL_COMMUNITY): Payer: BC Managed Care – PPO

## 2023-10-30 ENCOUNTER — Observation Stay (HOSPITAL_COMMUNITY): Payer: BC Managed Care – PPO | Admitting: Critical Care Medicine

## 2023-10-30 ENCOUNTER — Encounter (HOSPITAL_COMMUNITY)
Admission: AD | Disposition: A | Discharge status: Home / Self Care | Payer: Self-pay | Source: Home / Self Care | Attending: Thoracic Surgery (Cardiothoracic Vascular Surgery)

## 2023-10-30 ENCOUNTER — Inpatient Hospital Stay (HOSPITAL_COMMUNITY)
Admission: AD | Admit: 2023-10-30 | Discharge: 2023-10-31 | DRG: 328 | Disposition: A | Discharge status: Home / Self Care | Payer: BC Managed Care – PPO | Attending: Thoracic Surgery (Cardiothoracic Vascular Surgery) | Admitting: Thoracic Surgery (Cardiothoracic Vascular Surgery)

## 2023-10-30 ENCOUNTER — Encounter (HOSPITAL_COMMUNITY): Payer: Self-pay | Admitting: Thoracic Surgery (Cardiothoracic Vascular Surgery)

## 2023-10-30 DIAGNOSIS — Z808 Family history of malignant neoplasm of other organs or systems: Secondary | ICD-10-CM

## 2023-10-30 DIAGNOSIS — Z6838 Body mass index (BMI) 38.0-38.9, adult: Secondary | ICD-10-CM

## 2023-10-30 DIAGNOSIS — I10 Essential (primary) hypertension: Secondary | ICD-10-CM | POA: Diagnosis present

## 2023-10-30 DIAGNOSIS — Z79899 Other long term (current) drug therapy: Secondary | ICD-10-CM

## 2023-10-30 DIAGNOSIS — K219 Gastro-esophageal reflux disease without esophagitis: Secondary | ICD-10-CM | POA: Diagnosis present

## 2023-10-30 DIAGNOSIS — J45909 Unspecified asthma, uncomplicated: Secondary | ICD-10-CM | POA: Diagnosis present

## 2023-10-30 DIAGNOSIS — Z01812 Encounter for preprocedural laboratory examination: Secondary | ICD-10-CM

## 2023-10-30 DIAGNOSIS — Z87442 Personal history of urinary calculi: Secondary | ICD-10-CM | POA: Diagnosis not present

## 2023-10-30 DIAGNOSIS — M797 Fibromyalgia: Secondary | ICD-10-CM | POA: Diagnosis present

## 2023-10-30 DIAGNOSIS — D179 Benign lipomatous neoplasm, unspecified: Secondary | ICD-10-CM | POA: Diagnosis present

## 2023-10-30 DIAGNOSIS — Z7982 Long term (current) use of aspirin: Secondary | ICD-10-CM | POA: Diagnosis not present

## 2023-10-30 DIAGNOSIS — Z833 Family history of diabetes mellitus: Secondary | ICD-10-CM | POA: Diagnosis not present

## 2023-10-30 DIAGNOSIS — G4730 Sleep apnea, unspecified: Secondary | ICD-10-CM | POA: Diagnosis present

## 2023-10-30 DIAGNOSIS — K449 Diaphragmatic hernia without obstruction or gangrene: Principal | ICD-10-CM

## 2023-10-30 DIAGNOSIS — E66813 Obesity, class 3: Secondary | ICD-10-CM | POA: Diagnosis present

## 2023-10-30 DIAGNOSIS — Z8249 Family history of ischemic heart disease and other diseases of the circulatory system: Secondary | ICD-10-CM | POA: Diagnosis not present

## 2023-10-30 DIAGNOSIS — E1142 Type 2 diabetes mellitus with diabetic polyneuropathy: Secondary | ICD-10-CM | POA: Diagnosis present

## 2023-10-30 DIAGNOSIS — Z01818 Encounter for other preprocedural examination: Secondary | ICD-10-CM

## 2023-10-30 DIAGNOSIS — Z9889 Other specified postprocedural states: Principal | ICD-10-CM

## 2023-10-30 DIAGNOSIS — Z7984 Long term (current) use of oral hypoglycemic drugs: Secondary | ICD-10-CM

## 2023-10-30 DIAGNOSIS — N400 Enlarged prostate without lower urinary tract symptoms: Secondary | ICD-10-CM | POA: Diagnosis present

## 2023-10-30 DIAGNOSIS — D72829 Elevated white blood cell count, unspecified: Secondary | ICD-10-CM | POA: Diagnosis present

## 2023-10-30 DIAGNOSIS — E785 Hyperlipidemia, unspecified: Secondary | ICD-10-CM | POA: Diagnosis present

## 2023-10-30 HISTORY — PX: ESOPHAGOGASTRODUODENOSCOPY: SHX5428

## 2023-10-30 HISTORY — PX: XI ROBOTIC ASSISTED PARAESOPHAGEAL HERNIA REPAIR: SHX6871

## 2023-10-30 LAB — GLUCOSE, CAPILLARY
Glucose-Capillary: 132 mg/dL — ABNORMAL HIGH (ref 70–99)
Glucose-Capillary: 213 mg/dL — ABNORMAL HIGH (ref 70–99)

## 2023-10-30 LAB — ABO/RH: ABO/RH(D): A POS

## 2023-10-30 SURGERY — CANCELLED PROCEDURE
Anesthesia: General

## 2023-10-30 SURGERY — REPAIR, HERNIA, PARAESOPHAGEAL, ROBOT-ASSISTED
Anesthesia: General | Site: Esophagus

## 2023-10-30 MED ORDER — ONDANSETRON HCL 4 MG/2ML IJ SOLN
INTRAMUSCULAR | Status: AC
  Filled 2023-10-30: qty 2

## 2023-10-30 MED ORDER — PROPOFOL 10 MG/ML IV BOLUS
INTRAVENOUS | Status: AC
  Filled 2023-10-30: qty 20

## 2023-10-30 MED ORDER — KETOROLAC TROMETHAMINE 15 MG/ML IJ SOLN
15.0000 mg | Freq: Four times a day (QID) | INTRAMUSCULAR | Status: DC
  Administered 2023-10-30 – 2023-10-31 (×3): 15 mg via INTRAVENOUS
  Filled 2023-10-30 (×3): qty 1

## 2023-10-30 MED ORDER — MIDAZOLAM HCL 2 MG/2ML IJ SOLN
INTRAMUSCULAR | Status: AC
  Filled 2023-10-30: qty 2

## 2023-10-30 MED ORDER — BUPIVACAINE HCL (PF) 0.5 % IJ SOLN
INTRAMUSCULAR | Status: AC
  Filled 2023-10-30: qty 30

## 2023-10-30 MED ORDER — PHENYLEPHRINE 80 MCG/ML (10ML) SYRINGE FOR IV PUSH (FOR BLOOD PRESSURE SUPPORT)
PREFILLED_SYRINGE | INTRAVENOUS | Status: AC
Start: 2023-10-30 — End: ?
  Filled 2023-10-30: qty 10

## 2023-10-30 MED ORDER — FENTANYL CITRATE (PF) 250 MCG/5ML IJ SOLN
INTRAMUSCULAR | Status: AC
  Filled 2023-10-30: qty 5

## 2023-10-30 MED ORDER — ENOXAPARIN SODIUM 40 MG/0.4ML IJ SOSY
40.0000 mg | PREFILLED_SYRINGE | Freq: Every day | INTRAMUSCULAR | Status: DC
  Administered 2023-10-31: 40 mg via SUBCUTANEOUS
  Filled 2023-10-30: qty 0.4

## 2023-10-30 MED ORDER — CARMEX CLASSIC LIP BALM EX OINT
TOPICAL_OINTMENT | CUTANEOUS | Status: DC | PRN
  Filled 2023-10-30: qty 10

## 2023-10-30 MED ORDER — PHENYLEPHRINE 80 MCG/ML (10ML) SYRINGE FOR IV PUSH (FOR BLOOD PRESSURE SUPPORT)
PREFILLED_SYRINGE | INTRAVENOUS | Status: DC | PRN
  Administered 2023-10-30: 80 ug via INTRAVENOUS
  Administered 2023-10-30 (×2): 160 ug via INTRAVENOUS

## 2023-10-30 MED ORDER — PROPOFOL 10 MG/ML IV BOLUS
INTRAVENOUS | Status: DC | PRN
  Administered 2023-10-30: 160 mg via INTRAVENOUS

## 2023-10-30 MED ORDER — FENTANYL CITRATE (PF) 250 MCG/5ML IJ SOLN
INTRAMUSCULAR | Status: DC | PRN
  Administered 2023-10-30: 100 ug via INTRAVENOUS

## 2023-10-30 MED ORDER — CHLORHEXIDINE GLUCONATE 0.12 % MT SOLN
OROMUCOSAL | Status: AC
  Administered 2023-10-30: 15 mL via OROMUCOSAL
  Filled 2023-10-30: qty 15

## 2023-10-30 MED ORDER — DEXAMETHASONE SODIUM PHOSPHATE 10 MG/ML IJ SOLN
INTRAMUSCULAR | Status: AC
  Filled 2023-10-30: qty 1

## 2023-10-30 MED ORDER — DEXAMETHASONE SODIUM PHOSPHATE 10 MG/ML IJ SOLN
INTRAMUSCULAR | Status: DC | PRN
  Administered 2023-10-30: 8 mg via INTRAVENOUS

## 2023-10-30 MED ORDER — SUGAMMADEX SODIUM 200 MG/2ML IV SOLN: INTRAVENOUS | Status: DC | PRN

## 2023-10-30 MED ORDER — PHENOL 1.4 % MT LIQD
1.0000 | OROMUCOSAL | Status: DC | PRN
  Administered 2023-10-30: 1 via OROMUCOSAL
  Filled 2023-10-30: qty 177

## 2023-10-30 MED ORDER — MIDAZOLAM HCL 2 MG/2ML IJ SOLN
INTRAMUSCULAR | Status: DC | PRN
  Administered 2023-10-30: 2 mg via INTRAVENOUS

## 2023-10-30 MED ORDER — PHENYLEPHRINE HCL-NACL 20-0.9 MG/250ML-% IV SOLN
INTRAVENOUS | Status: DC | PRN
  Administered 2023-10-30: 40 ug/min via INTRAVENOUS

## 2023-10-30 MED ORDER — ROCURONIUM BROMIDE 10 MG/ML (PF) SYRINGE
PREFILLED_SYRINGE | INTRAVENOUS | Status: AC
  Filled 2023-10-30: qty 10

## 2023-10-30 MED ORDER — SENNOSIDES-DOCUSATE SODIUM 8.6-50 MG PO TABS: 1.0000 | ORAL_TABLET | Freq: Every day | ORAL | Status: DC

## 2023-10-30 MED ORDER — NEOSTIGMINE METHYLSULFATE 3 MG/3ML IV SOSY
PREFILLED_SYRINGE | INTRAVENOUS | Status: AC
  Filled 2023-10-30: qty 6

## 2023-10-30 MED ORDER — BUPIVACAINE LIPOSOME 1.3 % IJ SUSP
INTRAMUSCULAR | Status: DC | PRN
  Administered 2023-10-30: 50 mL

## 2023-10-30 MED ORDER — CEFAZOLIN SODIUM-DEXTROSE 2-4 GM/100ML-% IV SOLN
2.0000 g | INTRAVENOUS | Status: DC
Start: 2023-10-30 — End: 2023-10-30

## 2023-10-30 MED ORDER — LIDOCAINE 2% (20 MG/ML) 5 ML SYRINGE
INTRAMUSCULAR | Status: AC
  Filled 2023-10-30: qty 5

## 2023-10-30 MED ORDER — SUGAMMADEX SODIUM 200 MG/2ML IV SOLN
INTRAVENOUS | Status: DC | PRN
  Administered 2023-10-30: 240 mg via INTRAVENOUS

## 2023-10-30 MED ORDER — PROPOFOL 10 MG/ML IV BOLUS
INTRAVENOUS | Status: DC | PRN
  Administered 2023-10-30: 150 mg via INTRAVENOUS

## 2023-10-30 MED ORDER — ACETAMINOPHEN 160 MG/5ML PO SOLN
1000.0000 mg | Freq: Four times a day (QID) | ORAL | Status: DC
  Filled 2023-10-30: qty 40.6

## 2023-10-30 MED ORDER — ONDANSETRON HCL 4 MG/2ML IJ SOLN
INTRAMUSCULAR | Status: DC | PRN
  Administered 2023-10-30: 4 mg via INTRAVENOUS

## 2023-10-30 MED ORDER — ONDANSETRON HCL 4 MG/2ML IJ SOLN: 4.0000 mg | Freq: Four times a day (QID) | INTRAMUSCULAR | Status: DC | PRN

## 2023-10-30 MED ORDER — ROCURONIUM BROMIDE 10 MG/ML (PF) SYRINGE
PREFILLED_SYRINGE | INTRAVENOUS | Status: DC | PRN
  Administered 2023-10-30: 80 mg via INTRAVENOUS

## 2023-10-30 MED ORDER — ACETAMINOPHEN 10 MG/ML IV SOLN: 1000.0000 mg | Freq: Once | INTRAVENOUS | Status: DC | PRN

## 2023-10-30 MED ORDER — GLYCOPYRROLATE PF 0.2 MG/ML IJ SOSY
PREFILLED_SYRINGE | INTRAMUSCULAR | Status: AC
  Filled 2023-10-30: qty 1

## 2023-10-30 MED ORDER — PANTOPRAZOLE SODIUM 40 MG PO TBEC
40.0000 mg | DELAYED_RELEASE_TABLET | Freq: Every day | ORAL | Status: DC
  Filled 2023-10-30: qty 1

## 2023-10-30 MED ORDER — LIDOCAINE 2% (20 MG/ML) 5 ML SYRINGE
INTRAMUSCULAR | Status: DC | PRN
  Administered 2023-10-30: 60 mg via INTRAVENOUS

## 2023-10-30 MED ORDER — INSULIN ASPART 100 UNIT/ML IJ SOLN: 0.0000 [IU] | INTRAMUSCULAR | Status: DC | PRN

## 2023-10-30 MED ORDER — BISACODYL 5 MG PO TBEC
10.0000 mg | DELAYED_RELEASE_TABLET | Freq: Every day | ORAL | Status: DC
  Administered 2023-10-31: 10 mg via ORAL
  Filled 2023-10-30: qty 2

## 2023-10-30 MED ORDER — PHENYLEPHRINE 80 MCG/ML (10ML) SYRINGE FOR IV PUSH (FOR BLOOD PRESSURE SUPPORT)
PREFILLED_SYRINGE | INTRAVENOUS | Status: DC | PRN
  Administered 2023-10-30 (×2): 80 ug via INTRAVENOUS

## 2023-10-30 MED ORDER — LACTATED RINGERS IV SOLN: INTRAVENOUS | Status: DC | PRN

## 2023-10-30 MED ORDER — NEOSTIGMINE METHYLSULFATE 10 MG/10ML IV SOLN
INTRAVENOUS | Status: DC | PRN
  Administered 2023-10-30: 5 mg via INTRAVENOUS

## 2023-10-30 MED ORDER — ORAL CARE MOUTH RINSE: 15.0000 mL | Freq: Once | OROMUCOSAL | Status: AC

## 2023-10-30 MED ORDER — ROCURONIUM BROMIDE 10 MG/ML (PF) SYRINGE
PREFILLED_SYRINGE | INTRAVENOUS | Status: AC
  Filled 2023-10-30: qty 20

## 2023-10-30 MED ORDER — CHLORHEXIDINE GLUCONATE 0.12 % MT SOLN: 15.0000 mL | Freq: Once | OROMUCOSAL | Status: AC

## 2023-10-30 MED ORDER — DEXTROSE 5 % IV SOLN
INTRAVENOUS | Status: DC | PRN
  Administered 2023-10-30: 3 g via INTRAVENOUS

## 2023-10-30 MED ORDER — ROCURONIUM BROMIDE 10 MG/ML (PF) SYRINGE
PREFILLED_SYRINGE | INTRAVENOUS | Status: DC | PRN
  Administered 2023-10-30: 20 mg via INTRAVENOUS
  Administered 2023-10-30: 100 mg via INTRAVENOUS

## 2023-10-30 MED ORDER — PHENYLEPHRINE 80 MCG/ML (10ML) SYRINGE FOR IV PUSH (FOR BLOOD PRESSURE SUPPORT)
PREFILLED_SYRINGE | INTRAVENOUS | Status: AC
  Filled 2023-10-30: qty 10

## 2023-10-30 MED ORDER — 0.9 % SODIUM CHLORIDE (POUR BTL) OPTIME
TOPICAL | Status: DC | PRN
  Administered 2023-10-30: 1000 mL

## 2023-10-30 MED ORDER — FENTANYL CITRATE (PF) 100 MCG/2ML IJ SOLN: 25.0000 ug | INTRAMUSCULAR | Status: DC | PRN

## 2023-10-30 MED ORDER — HYDROMORPHONE HCL 1 MG/ML IJ SOLN: 0.2500 mg | INTRAMUSCULAR | Status: DC | PRN

## 2023-10-30 MED ORDER — MORPHINE SULFATE (PF) 2 MG/ML IV SOLN
2.0000 mg | INTRAVENOUS | Status: DC | PRN
  Administered 2023-10-30 – 2023-10-31 (×4): 2 mg via INTRAVENOUS
  Filled 2023-10-30 (×4): qty 1

## 2023-10-30 MED ORDER — CEFAZOLIN IN SODIUM CHLORIDE 3-0.9 GM/100ML-% IV SOLN
INTRAVENOUS | Status: AC
  Filled 2023-10-30: qty 100

## 2023-10-30 MED ORDER — ALBUTEROL SULFATE HFA 108 (90 BASE) MCG/ACT IN AERS
INHALATION_SPRAY | RESPIRATORY_TRACT | Status: DC | PRN
  Administered 2023-10-30: 2 via RESPIRATORY_TRACT

## 2023-10-30 MED ORDER — BUPIVACAINE LIPOSOME 1.3 % IJ SUSP
INTRAMUSCULAR | Status: AC
  Filled 2023-10-30: qty 20

## 2023-10-30 MED ORDER — ACETAMINOPHEN 500 MG PO TABS: 1000.0000 mg | ORAL_TABLET | Freq: Once | ORAL | Status: DC

## 2023-10-30 MED ORDER — CEFAZOLIN SODIUM-DEXTROSE 2-4 GM/100ML-% IV SOLN
2.0000 g | Freq: Three times a day (TID) | INTRAVENOUS | Status: AC
  Administered 2023-10-30 – 2023-10-31 (×2): 2 g via INTRAVENOUS
  Filled 2023-10-30 (×2): qty 100

## 2023-10-30 MED ORDER — GLYCOPYRROLATE PF 0.2 MG/ML IJ SOSY
PREFILLED_SYRINGE | INTRAMUSCULAR | Status: AC
  Filled 2023-10-30: qty 3

## 2023-10-30 MED ORDER — ACETAMINOPHEN 500 MG PO TABS: 1000.0000 mg | ORAL_TABLET | Freq: Four times a day (QID) | ORAL | Status: DC

## 2023-10-30 MED ORDER — HYDRALAZINE HCL 20 MG/ML IJ SOLN: 10.0000 mg | Freq: Four times a day (QID) | INTRAMUSCULAR | Status: DC | PRN

## 2023-10-30 MED ORDER — DEXAMETHASONE SODIUM PHOSPHATE 10 MG/ML IJ SOLN
INTRAMUSCULAR | Status: DC | PRN
  Administered 2023-10-30: 10 mg via INTRAVENOUS

## 2023-10-30 MED ORDER — GLYCOPYRROLATE 0.2 MG/ML IJ SOLN
INTRAMUSCULAR | Status: DC | PRN
  Administered 2023-10-30: .8 mg via INTRAVENOUS

## 2023-10-30 MED ORDER — SODIUM CHLORIDE 0.9 % IV SOLN: INTRAVENOUS | Status: DC

## 2023-10-30 MED ORDER — FENTANYL CITRATE (PF) 250 MCG/5ML IJ SOLN
INTRAMUSCULAR | Status: DC | PRN
  Administered 2023-10-30: 150 ug via INTRAVENOUS
  Administered 2023-10-30 (×2): 50 ug via INTRAVENOUS

## 2023-10-30 MED ORDER — MIDAZOLAM HCL 2 MG/2ML IJ SOLN
INTRAMUSCULAR | Status: AC
Start: 2023-10-30 — End: ?
  Filled 2023-10-30: qty 2

## 2023-10-30 MED ORDER — LACTATED RINGERS IV SOLN: INTRAVENOUS | Status: DC

## 2023-10-30 SURGICAL SUPPLY — 66 items
BLADE SURG 11 STRL SS (BLADE) ×2 IMPLANT
BUTTON OLYMPUS DEFENDO 5 PIECE (MISCELLANEOUS) ×2 IMPLANT
CANISTER SUCT 3000ML PPV (MISCELLANEOUS) ×4 IMPLANT
CNTNR URN SCR LID CUP LEK RST (MISCELLANEOUS) ×2 IMPLANT
DEFOGGER SCOPE WARMER CLEARIFY (MISCELLANEOUS) ×2 IMPLANT
DERMABOND ADVANCED .7 DNX12 (GAUZE/BANDAGES/DRESSINGS) ×2 IMPLANT
DEVICE SUTURE ENDOST 10MM (ENDOMECHANICALS) IMPLANT
DRAPE ARM DVNC X/XI (DISPOSABLE) ×8 IMPLANT
DRAPE COLUMN DVNC XI (DISPOSABLE) ×2 IMPLANT
DRAPE CV SPLIT W-CLR ANES SCRN (DRAPES) ×2 IMPLANT
DRAPE INCISE IOBAN 66X45 STRL (DRAPES) IMPLANT
DRAPE SURG ORHT 6 SPLT 77X108 (DRAPES) ×2 IMPLANT
DRIVER NDL LRG 8 DVNC XI (INSTRUMENTS) IMPLANT
DRIVER NDL MEGA SUTCUT DVNCXI (INSTRUMENTS) IMPLANT
DRIVER NDLE LRG 8 DVNC XI (INSTRUMENTS) ×2 IMPLANT
DRIVER NDLE MEGA SUTCUT DVNCXI (INSTRUMENTS) ×2 IMPLANT
ELECT REM PT RETURN 9FT ADLT (ELECTROSURGICAL) ×2 IMPLANT
ELECTRODE REM PT RTRN 9FT ADLT (ELECTROSURGICAL) ×2 IMPLANT
FELT TEFLON 1X6 (MISCELLANEOUS) IMPLANT
FORCEPS BPLR LNG DVNC XI (INSTRUMENTS) IMPLANT
FORCEPS CADIERE DVNC XI (FORCEP) IMPLANT
GAUZE SPONGE 4X4 12PLY STRL (GAUZE/BANDAGES/DRESSINGS) ×2 IMPLANT
GLOVE BIO SURGEON STRL SZ7 (GLOVE) ×2 IMPLANT
GLOVE BIO SURGEON STRL SZ7.5 (GLOVE) ×6 IMPLANT
GOWN STRL REUS W/ TWL LRG LVL3 (GOWN DISPOSABLE) ×2 IMPLANT
GOWN STRL REUS W/ TWL XL LVL3 (GOWN DISPOSABLE) ×4 IMPLANT
GOWN STRL REUS W/TWL 2XL LVL3 (GOWN DISPOSABLE) ×2 IMPLANT
GRASPER SUT TROCAR 14GX15 (MISCELLANEOUS) IMPLANT
GRASPER TIP-UP FEN DVNC XI (INSTRUMENTS) IMPLANT
HEMOSTAT SURGICEL 2X14 (HEMOSTASIS) ×2 IMPLANT
IV NS 1000ML BAXH (IV SOLUTION) IMPLANT
KIT BASIN OR (CUSTOM PROCEDURE TRAY) ×2 IMPLANT
KIT TURNOVER KIT B (KITS) ×2 IMPLANT
MARKER SKIN DUAL TIP RULER LAB (MISCELLANEOUS) ×2 IMPLANT
MESH OVITEX 1S RESORB 4X8 6L (Mesh General) IMPLANT
NDL HYPO 22X1.5 SAFETY MO (MISCELLANEOUS) ×2 IMPLANT
NEEDLE HYPO 22X1.5 SAFETY MO (MISCELLANEOUS) ×2 IMPLANT
NS IRRIG 1000ML POUR BTL (IV SOLUTION) ×4 IMPLANT
OBTURATOR OPTICAL STND 8 DVNC (TROCAR) ×2 IMPLANT
OBTURATOR OPTICALSTD 8 DVNC (TROCAR) IMPLANT
OIL SILICONE PENTAX (PARTS (SERVICE/REPAIRS)) IMPLANT
PACK CHEST (CUSTOM PROCEDURE TRAY) ×2 IMPLANT
PAD ARMBOARD 7.5X6 YLW CONV (MISCELLANEOUS) ×4 IMPLANT
PORT ACCESS TROCAR AIRSEAL 12 (TROCAR) IMPLANT
SEAL UNIV 5-12 XI (MISCELLANEOUS) ×8 IMPLANT
SEALER SYNCHRO 8 IS4000 DVNC (MISCELLANEOUS) IMPLANT
SET TRI-LUMEN FLTR TB AIRSEAL (TUBING) ×2 IMPLANT
SUT ETHIBOND 0 36 GRN (SUTURE) ×4 IMPLANT
SUT SILK 1 MH (SUTURE) ×2 IMPLANT
SUT SURGIDAC NAB ES-9 0 48 120 (SUTURE) IMPLANT
SUT VIC AB 2-0 CT1 18 (SUTURE) ×2 IMPLANT
SUT VIC AB 3-0 SH 27X BRD (SUTURE) ×4 IMPLANT
SUT VICRYL 0 UR6 27IN ABS (SUTURE) ×4 IMPLANT
SYR 20ML ECCENTRIC (SYRINGE) ×2 IMPLANT
SYSTEM SAHARA CHEST DRAIN ATS (WOUND CARE) IMPLANT
TOWEL GREEN STERILE (TOWEL DISPOSABLE) ×2 IMPLANT
TOWEL GREEN STERILE FF (TOWEL DISPOSABLE) ×2 IMPLANT
TRAY FOLEY MTR SLVR 16FR STAT (SET/KITS/TRAYS/PACK) ×2 IMPLANT
TRAY WAYNE PNEUMOTHORAX 14X18 (TRAY / TRAY PROCEDURE) IMPLANT
TROCAR PORT AIRSEAL 8X120 (TROCAR) IMPLANT
TROCAR XCEL BLADELESS 5X75MML (TROCAR) ×2 IMPLANT
TROCAR XCEL NON-BLD 5MMX100MML (ENDOMECHANICALS) IMPLANT
TUBE CONNECTING 20X1/4 (TUBING) ×2 IMPLANT
TUBING ENDO SMARTCAP (MISCELLANEOUS) ×2 IMPLANT
UNDERPAD 30X36 HEAVY ABSORB (UNDERPADS AND DIAPERS) ×2 IMPLANT
WATER STERILE IRR 1000ML POUR (IV SOLUTION) ×2 IMPLANT

## 2023-10-30 SURGICAL SUPPLY — 64 items
BLADE SURG 11 STRL SS (BLADE) ×1 IMPLANT
BUTTON OLYMPUS DEFENDO 5 PIECE (MISCELLANEOUS) ×1 IMPLANT
CANISTER SUCT 3000ML PPV (MISCELLANEOUS) ×2 IMPLANT
CNTNR URN SCR LID CUP LEK RST (MISCELLANEOUS) ×1 IMPLANT
DEFOGGER SCOPE WARMER CLEARIFY (MISCELLANEOUS) ×1 IMPLANT
DERMABOND ADVANCED .7 DNX12 (GAUZE/BANDAGES/DRESSINGS) ×1 IMPLANT
DEVICE SUTURE ENDOST 10MM (ENDOMECHANICALS) IMPLANT
DRAPE ARM DVNC X/XI (DISPOSABLE) ×4 IMPLANT
DRAPE COLUMN DVNC XI (DISPOSABLE) ×1 IMPLANT
DRAPE CV SPLIT W-CLR ANES SCRN (DRAPES) ×1 IMPLANT
DRAPE INCISE IOBAN 66X45 STRL (DRAPES) IMPLANT
DRAPE SURG ORHT 6 SPLT 77X108 (DRAPES) ×1 IMPLANT
DRIVER NDL LRG 8 DVNC XI (INSTRUMENTS) IMPLANT
DRIVER NDL MEGA SUTCUT DVNCXI (INSTRUMENTS) IMPLANT
DRIVER NDLE LRG 8 DVNC XI (INSTRUMENTS) IMPLANT
DRIVER NDLE MEGA SUTCUT DVNCXI (INSTRUMENTS) IMPLANT
ELECT REM PT RETURN 9FT ADLT (ELECTROSURGICAL)
ELECTRODE REM PT RTRN 9FT ADLT (ELECTROSURGICAL) ×1 IMPLANT
FELT TEFLON 1X6 (MISCELLANEOUS) IMPLANT
FORCEPS BPLR LNG DVNC XI (INSTRUMENTS) IMPLANT
FORCEPS CADIERE DVNC XI (FORCEP) IMPLANT
GAUZE SPONGE 4X4 12PLY STRL (GAUZE/BANDAGES/DRESSINGS) ×1 IMPLANT
GLOVE BIO SURGEON STRL SZ7 (GLOVE) ×1 IMPLANT
GLOVE BIO SURGEON STRL SZ7.5 (GLOVE) ×3 IMPLANT
GOWN STRL REUS W/ TWL LRG LVL3 (GOWN DISPOSABLE) ×1 IMPLANT
GOWN STRL REUS W/ TWL XL LVL3 (GOWN DISPOSABLE) ×2 IMPLANT
GOWN STRL REUS W/TWL 2XL LVL3 (GOWN DISPOSABLE) ×1 IMPLANT
GRASPER SUT TROCAR 14GX15 (MISCELLANEOUS) IMPLANT
GRASPER TIP-UP FEN DVNC XI (INSTRUMENTS) IMPLANT
HEMOSTAT SURGICEL 2X14 (HEMOSTASIS) ×1 IMPLANT
IV NS 1000ML BAXH (IV SOLUTION) IMPLANT
KIT BASIN OR (CUSTOM PROCEDURE TRAY) ×1 IMPLANT
KIT TURNOVER KIT B (KITS) ×1 IMPLANT
MARKER SKIN DUAL TIP RULER LAB (MISCELLANEOUS) ×1 IMPLANT
NDL HYPO 22X1.5 SAFETY MO (MISCELLANEOUS) ×1 IMPLANT
NEEDLE HYPO 22X1.5 SAFETY MO (MISCELLANEOUS) IMPLANT
NS IRRIG 1000ML POUR BTL (IV SOLUTION) ×2 IMPLANT
OBTURATOR OPTICAL STND 8 DVNC (TROCAR)
OBTURATOR OPTICALSTD 8 DVNC (TROCAR) IMPLANT
OIL SILICONE PENTAX (PARTS (SERVICE/REPAIRS)) IMPLANT
PACK CHEST (CUSTOM PROCEDURE TRAY) ×1 IMPLANT
PAD ARMBOARD 7.5X6 YLW CONV (MISCELLANEOUS) ×2 IMPLANT
PORT ACCESS TROCAR AIRSEAL 12 (TROCAR) IMPLANT
SEAL UNIV 5-12 XI (MISCELLANEOUS) ×4 IMPLANT
SEALER SYNCHRO 8 IS4000 DVNC (MISCELLANEOUS) IMPLANT
SET TRI-LUMEN FLTR TB AIRSEAL (TUBING) ×1 IMPLANT
SUT ETHIBOND 0 36 GRN (SUTURE) ×2 IMPLANT
SUT SILK 1 MH (SUTURE) ×1 IMPLANT
SUT SURGIDAC NAB ES-9 0 48 120 (SUTURE) IMPLANT
SUT VIC AB 2-0 CT1 18 (SUTURE) ×1 IMPLANT
SUT VIC AB 3-0 SH 27X BRD (SUTURE) ×2 IMPLANT
SUT VICRYL 0 UR6 27IN ABS (SUTURE) ×2 IMPLANT
SYR 20ML ECCENTRIC (SYRINGE) ×1 IMPLANT
SYSTEM SAHARA CHEST DRAIN ATS (WOUND CARE) IMPLANT
TOWEL GREEN STERILE (TOWEL DISPOSABLE) ×1 IMPLANT
TOWEL GREEN STERILE FF (TOWEL DISPOSABLE) ×1 IMPLANT
TRAY FOLEY MTR SLVR 16FR STAT (SET/KITS/TRAYS/PACK) ×1 IMPLANT
TROCAR PORT AIRSEAL 8X120 (TROCAR) IMPLANT
TROCAR XCEL BLADELESS 5X75MML (TROCAR) ×1 IMPLANT
TROCAR XCEL NON-BLD 5MMX100MML (ENDOMECHANICALS) IMPLANT
TUBE CONNECTING 20X1/4 (TUBING) ×1 IMPLANT
TUBING ENDO SMARTCAP (MISCELLANEOUS) ×1 IMPLANT
UNDERPAD 30X36 HEAVY ABSORB (UNDERPADS AND DIAPERS) ×1 IMPLANT
WATER STERILE IRR 1000ML POUR (IV SOLUTION) ×1 IMPLANT

## 2023-10-30 NOTE — Progress Notes (Signed)
Bladder scan done in PACU.  No urine in bladder.  Will continue to monitor.

## 2023-10-30 NOTE — Anesthesia Postprocedure Evaluation (Signed)
Anesthesia Post Note  Patient: Timothy [name]  Procedure(s) Performed: XI ROBOTIC ASSISTED PARAESOPHAGEAL HERNIA REPAIR (Chest) ESOPHAGOGASTRODUODENOSCOPY (EGD) (Esophagus)     Patient location during evaluation: PACU Anesthesia Type: General Level of consciousness: awake and alert Pain management: pain level controlled Vital Signs Assessment: post-procedure vital signs reviewed and stable Respiratory status: spontaneous breathing, nonlabored ventilation, respiratory function stable and patient connected to nasal cannula oxygen Cardiovascular status: blood pressure returned to baseline and stable Postop Assessment: no apparent nausea or vomiting Anesthetic complications: yes   Encounter Notable Events  Notable Event Outcome Phase Comment  Difficult to intubate - expected  Intraprocedure Filed from anesthesia note documentation.    Last Vitals:  Vitals:   10/30/23 2100 10/30/23 2124  BP: (!) 140/71   Pulse: 97 84  Resp: 20 20  Temp: 36.8 C   SpO2: 97% 95%    Last Pain:  Vitals:   10/30/23 2124  TempSrc:   PainSc: 9                  [name] Kaladin Noseworthy

## 2023-10-30 NOTE — Progress Notes (Signed)
   10/30/23 2245  BiPAP/CPAP/SIPAP  $ Non-Invasive Ventilator  Non-Invasive Vent Set Up  $ Face Mask Large  Yes  BiPAP/CPAP/SIPAP Pt Type Adult  BiPAP/CPAP/SIPAP DREAMSTATIOND  Mask Type Full face mask  Mask Size Large  PEEP 16 cmH20  FiO2 (%) 30 %  Patient Home Equipment Yes  Auto Titrate  (Pt mask)

## 2023-10-30 NOTE — Anesthesia Procedure Notes (Signed)
Procedure Name: Intubation Date/Time: 10/30/2023 12:19 PM  Performed by: Little Ishikawa, CRNAPre-anesthesia Checklist: Patient identified, Emergency Drugs available, Suction available, Timeout performed and Patient being monitored Patient Re-evaluated:Patient Re-evaluated prior to induction Oxygen Delivery Method: Circle system utilized Preoxygenation: Pre-oxygenation with 100% oxygen Induction Type: IV induction Ventilation: Two handed mask ventilation required and Oral airway inserted - appropriate to patient size Laryngoscope Size: Glidescope and 4 Grade View: Grade I Tube type: Oral Tube size: 7.5 mm Number of attempts: 2 Airway Equipment and Method: Stylet Placement Confirmation: ETT inserted through vocal cords under direct vision, positive ETCO2, CO2 detector and breath sounds checked- equal and bilateral Secured at: 23 cm Tube secured with: Tape Dental Injury: Teeth and Oropharynx as per pre-operative assessment  Comments: Performed by Adrienne Mocha

## 2023-10-30 NOTE — Anesthesia Preprocedure Evaluation (Signed)
Anesthesia Evaluation  Patient identified by MRN, date of birth, ID band Patient awake    Reviewed: Allergy & Precautions, H&P , NPO status , Patient's Chart, lab work & pertinent test results  Airway Mallampati: III  TM Distance: >3 FB Neck ROM: Full    Dental  (+) Teeth Intact, Dental Advisory Given,    Pulmonary asthma , sleep apnea and Continuous Positive Airway Pressure Ventilation    breath sounds clear to auscultation       Cardiovascular hypertension, Pt. on medications  Rhythm:Regular Rate:Normal     Neuro/Psych negative neurological ROS  negative psych ROS   GI/Hepatic Neg liver ROS, hiatal hernia,GERD  Medicated,,  Endo/Other  diabetes, Type 2, Oral Hypoglycemic Agents  Class 3 obesity  Renal/GU negative Renal ROSLab Results      Component                Value               Date                      NA                       140                 10/28/2023                K                        3.9                 10/28/2023                CO2                      26                  10/28/2023                GLUCOSE                  75                  10/28/2023                BUN                      15                  10/28/2023                CREATININE               0.98                10/28/2023                CALCIUM                  9.4                 10/28/2023                GFRNONAA                 >60  10/28/2023                Musculoskeletal  (+)  Fibromyalgia -  Abdominal   Peds  Hematology negative hematology ROS (+) Lab Results      Component                Value               Date                      WBC                      7.6                 10/28/2023                HGB                      12.5 (L)            10/28/2023                HCT                      39.9                10/28/2023                MCV                      77.9 (L)            10/28/2023                 PLT                      273                 10/28/2023              Anesthesia Other Findings Copied from morning preop. Patient induced and intubated but had to be emerged and extubated due to emergency in other room. Now returning for original procedure.   Reproductive/Obstetrics                              Anesthesia Physical Anesthesia Plan  ASA: 3  Anesthesia Plan: General   Post-op Pain Management: Ofirmev IV (intra-op)* and Toradol IV (intra-op)*   Induction: Intravenous  PONV Risk Score and Plan: 3 and Ondansetron, Dexamethasone and Midazolam  Airway Management Planned: Oral ETT  Additional Equipment: None  Intra-op Plan:   Post-operative Plan: Extubation in OR  Informed Consent: I have reviewed the patients History and Physical, chart, labs and discussed the procedure including the risks, benefits and alternatives for the proposed anesthesia with the patient or authorized representative who has indicated his/her understanding and acceptance.     Dental advisory given  Plan Discussed with: CRNA  Anesthesia Plan Comments:         Anesthesia Quick Evaluation

## 2023-10-30 NOTE — Anesthesia Procedure Notes (Addendum)
Procedure Name: Intubation Date/Time: 10/30/2023 5:24 PM  Performed by: Eulah Pont, CRNAPre-anesthesia Checklist: Patient identified, Emergency Drugs available, Suction available and Patient being monitored Patient Re-evaluated:Patient Re-evaluated prior to induction Oxygen Delivery Method: Circle System Utilized Preoxygenation: Pre-oxygenation with 100% oxygen Induction Type: IV induction Ventilation: Two handed mask ventilation required and Oral airway inserted - appropriate to patient size Laryngoscope Size: Glidescope and 4 Grade View: Grade II Tube type: Oral Tube size: 7.5 mm Number of attempts: 1 Airway Equipment and Method: Stylet and Oral airway Placement Confirmation: ETT inserted through vocal cords under direct vision, positive ETCO2 and breath sounds checked- equal and bilateral Secured at: 23 cm Tube secured with: Tape Dental Injury: Teeth and Oropharynx as per pre-operative assessment  Comments: Pt has limited mouth opening, was noted to have anterior airway from previous intubation attempt

## 2023-10-30 NOTE — Hospital Course (Addendum)
HPI: This is a 62 y.o. male who was seen in the office by Dr. 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  He denies any dysphagia, but has been on Prilosec for several years.  He has been having some lightheadedness, as well as headaches. Per Dr. Cliffton Asters, given his weight he is at a slightly higher risk of recurrence, but given his history of GI bleed due to the Cameron's ulcers Dr. Cliffton Asters thought that robotic assisted repair was warranted. Potential risks, benefits, and complications of the surgery were discussed with the patient and he agreed to proceed with surgery.  Hospital Course: Patient underwent an esophagogastroduodenoscopy and robotic assisted laparoscopy para esophageal hernia repair with Ovitex mesh and pledgets. Patient was extubated and transported from the OR to PACU in stable condition. He remained NPO. Swallow study was done 10/31/2023. Results showed no evidence of contrast leak.  He was started on a dysphagia 1 diet which she tolerated and overall was felt to be stable for discharge on postop day #1.

## 2023-10-30 NOTE — Interval H&P Note (Signed)
History and Physical Interval Note:  10/30/2023 11:12 AM  Timothy [name]  has presented today for surgery, with the diagnosis of PEH.  The various methods of treatment have been discussed with the patient and family. After consideration of risks, benefits and other options for treatment, the patient has consented to  Procedure(s): XI ROBOTIC ASSISTED PARAESOPHAGEAL HERNIA REPAIR WITH FUNDOPLICATION (N/A) ESOPHAGOGASTRODUODENOSCOPY (EGD) (N/A) as a surgical intervention.  The patient's history has been reviewed, patient examined, no change in status, stable for surgery.  I have reviewed the patient's chart and labs.  Questions were answered to the patient's satisfaction.     Jarquez Mestre Keane Scrape

## 2023-10-30 NOTE — Op Note (Signed)
[address]       [name] [address]             [phone]        10/30/2023  Patient:  Timothy [name] Pre-Op Dx: paraesophageal hernia Hx of Cameron's Ulcers Obesity    Post-op Dx:  same Procedure: - Esophagoscopy - Robotic assisted laparoscopy - Paraesophageal hernia repair with Ovitex mesh pledgets   Surgeon and Role:      * Brion Sossamon, Eliezer Lofts, MD - Primary  Assistant: Gaynelle Arabian, PA-C  An experienced assistant was required given the complexity of this surgery and the standard of surgical care. The assistant was needed for exposure, dissection, suctioning, retraction of delicate tissues and sutures, instrument exchange and for overall help during this procedure.   Anesthesia  general EBL:  50ml Blood Administration: none Specimen:  none   Counts: correct   Indications: 62 year old male with a large paraesophageal hernia.  He also has a history of Cameron's ulcers as well as anemia.  We discussed the risks and benefits of EGD, with robotic assisted paraesophageal hernia repair.  Given his weight he is at a slightly higher risk of recurrence, but given his history of GI bleed due to the Cameron's ulcers I think that repair is warranted.  He is agreeable to proceed.   Findings: Large hiatal hernia with retroperitoneal lipoma.  Operative Technique: After the risks, benefits and alternatives were thoroughly discussed, the patient was brought to the operative theatre.  Anesthesia was induced, and the esophagoscope was passed through the oropharynx down to the stomach.  The scope was retroflexed and the hiatal hernia was clearly evident.  The scope was pulled back and the mucosal surface of the esophagus was visualized.    The scope was then parked at 25 cm from the incisors.  The patient was then prepped and draped in normal sterile fashion.  An appropriate surgical pause was performed, and pre-operative antibiotics were dosed accordingly.  We  began with a 1 cm incision 15 cm caudad from the xiphoid and slightly lateral to the umbilicus.  Using an Optiview we entered the peritoneal space.  The abdomen was then insufflated with CO2.  3 other robotic ports were placed to triangulate the hiatus.  Another 12 mm port was placed in place at the level of the umbilicus laterally for an assistant port and another 5 mm trocar was placed in the right lower quadrant for liver retractor.  The patient was then placed in steep reverse Trendelenburg and the liver was elevated to expose the esophageal hiatus.  And then the robot was docked.  We began by dividing the gastrohepatic ligament to expose the right diaphragmatic crus and then dissected the hernia sac in a clockwise fashion to mobilize there the stomach and esophagus.  We then divided the short gastrics and moved towards the right crus and completed our dissection along the esophageal hiatus.  A Penrose drain was then used to encircle the the esophagus and we continued our dissection up into the mediastinum.  Once we had achieved 3 to 4 cm of intra-abdominal esophagus we then proceeded to reapproximate the crura with 0 Ethibond sutures in an interrupted fashion.   The gastroscope was passed down through the lower esophageal sphincter into the stomach and would act as our bougie during this repair.   An air leak test was performed using the gastroscope.  No leak was evident.  The liver retractor was removed and all  ports were removed under direct visualization.  The skin and soft tissue were closed with absorbable suture    The patient tolerated the procedure without any immediate complications, and was transferred to the PACU in stable condition.  Timothy [name]

## 2023-10-30 NOTE — Anesthesia Postprocedure Evaluation (Signed)
Anesthesia Post Note  Patient: Terone Roland  Procedure(s) Performed: XI ROBOTIC ASSISTED PARAESOPHAGEAL HERNIA REPAIR WITH FUNDOPLICATION (Chest) ESOPHAGOGASTRODUODENOSCOPY (EGD)     Patient location during evaluation: PACU Anesthesia Type: General Level of consciousness: awake and alert Pain management: pain level controlled Vital Signs Assessment: post-procedure vital signs reviewed and stable Respiratory status: spontaneous breathing, nonlabored ventilation and respiratory function stable Cardiovascular status: blood pressure returned to baseline and stable Postop Assessment: no apparent nausea or vomiting Anesthetic complications: no  No notable events documented.  Last Vitals:  Vitals:   10/30/23 1400 10/30/23 1424  BP: 131/69   Pulse: (!) 58   Resp: 18 17  Temp: 36.8 C   SpO2: 93%     Last Pain:  Vitals:   10/30/23 1424  TempSrc:   PainSc: 0-No pain                 Akshaj Besancon,W. EDMOND

## 2023-10-30 NOTE — Transfer of Care (Signed)
Immediate Anesthesia Transfer of Care Note  Patient: Timothy [name]  Procedure(s) Performed: XI ROBOTIC ASSISTED PARAESOPHAGEAL HERNIA REPAIR (Chest) ESOPHAGOGASTRODUODENOSCOPY (EGD) (Esophagus)  Patient Location: PACU  Anesthesia Type:General  Level of Consciousness: awake and alert   Airway & Oxygen Therapy: Pt spontaneously breathing and placed on face mask  Post-op Assessment: Report given to RN and Post -op Vital signs reviewed and stable  Post vital signs: Reviewed and stable  Last Vitals:  Vitals Value Taken Time  BP 171/77 10/30/23 1956  Temp    Pulse 104 10/30/23 1958  Resp 22 10/30/23 1958  SpO2 93 % 10/30/23 1958  Vitals shown include unfiled device data.  Last Pain:  Vitals:   10/30/23 1543  TempSrc: Oral  PainSc:          Complications:  Encounter Notable Events  Notable Event Outcome Phase Comment  Difficult to intubate - expected  Intraprocedure Filed from anesthesia note documentation.

## 2023-10-30 NOTE — Anesthesia Preprocedure Evaluation (Addendum)
Anesthesia Evaluation  Patient identified by MRN, date of birth, ID band Patient awake    Reviewed: Allergy & Precautions, H&P , NPO status , Patient's Chart, lab work & pertinent test results  Airway Mallampati: III  TM Distance: >3 FB Neck ROM: Full    Dental no notable dental hx. (+) Teeth Intact, Dental Advisory Given   Pulmonary asthma , sleep apnea and Continuous Positive Airway Pressure Ventilation    Pulmonary exam normal breath sounds clear to auscultation       Cardiovascular hypertension, Pt. on medications  Rhythm:Regular Rate:Normal     Neuro/Psych negative neurological ROS  negative psych ROS   GI/Hepatic Neg liver ROS, hiatal hernia,GERD  Medicated,,  Endo/Other  diabetes, Type 2, Oral Hypoglycemic Agents  Class 3 obesity  Renal/GU negative Renal ROS  negative genitourinary   Musculoskeletal  (+)  Fibromyalgia -  Abdominal   Peds  Hematology negative hematology ROS (+)   Anesthesia Other Findings   Reproductive/Obstetrics negative OB ROS                             Anesthesia Physical Anesthesia Plan  ASA: 3  Anesthesia Plan: General   Post-op Pain Management: Tylenol PO (pre-op)*   Induction: Intravenous  PONV Risk Score and Plan: 3 and Ondansetron, Dexamethasone and Midazolam  Airway Management Planned: Oral ETT  Additional Equipment:   Intra-op Plan:   Post-operative Plan: Extubation in OR  Informed Consent: I have reviewed the patients History and Physical, chart, labs and discussed the procedure including the risks, benefits and alternatives for the proposed anesthesia with the patient or authorized representative who has indicated his/her understanding and acceptance.     Dental advisory given  Plan Discussed with: CRNA  Anesthesia Plan Comments:        Anesthesia Quick Evaluation

## 2023-10-30 NOTE — Transfer of Care (Signed)
Immediate Anesthesia Transfer of Care Note  Patient: Timothy [name]  Procedure(s) Performed: XI ROBOTIC ASSISTED PARAESOPHAGEAL HERNIA REPAIR WITH FUNDOPLICATION (Chest) ESOPHAGOGASTRODUODENOSCOPY (EGD)  Patient Location: PACU  Anesthesia Type:General  Level of Consciousness: awake, alert , and oriented  Airway & Oxygen Therapy: Patient Spontanous Breathing and Patient connected to face mask oxygen  Post-op Assessment: Report given to RN and Post -op Vital signs reviewed and stable  Post vital signs: Reviewed and stable  Last Vitals:  Vitals Value Taken Time  BP 151/71 10/30/23 1333  Temp    Pulse 86 10/30/23 1334  Resp 17 10/30/23 1334  SpO2 96 % 10/30/23 1334  Vitals shown include unfiled device data.  Last Pain:  Vitals:   10/30/23 1043  TempSrc:   PainSc: 4          Complications: No notable events documented.

## 2023-10-30 NOTE — Brief Op Note (Signed)
10/30/2023  6:46 PM  PATIENT:  Timothy [name]  62 y.o. male  PRE-OPERATIVE DIAGNOSIS:  PARAESOPHAGEAL HERNIA  POST-OPERATIVE DIAGNOSIS:  PARAESOPHAGEAL HERNIA  PROCEDURES:  ESOPHAGOGASTRODUODENOSCOPY and XI ROBOTIC ASSISTED PARAESOPHAGEAL HERNIA REPAIR with OVITEX MESH and PLEDGETS  SURGEON:  Lightfoot, Eliezer Lofts, MD   PHYSICIAN ASSISTANTS:  D.Tel Hevia, M.Roddenberry  ASSISTANTS: Bruins, Brooklyn L, Scrub Person    ANESTHESIA:   general  EBL:   BLOOD ADMINISTERED:none  DRAINS:  Pigtail catheter placed in right chest. It will be placed to heimlich valve    LOCAL MEDICATIONS USED:  Exparel  SPECIMEN:  No Specimen  COUNTS CORRECT  DICTATION: .Dragon Dictation  PLAN OF CARE: Admit to inpatient   PATIENT DISPOSITION:  PACU - hemodynamically stable.   Delay start of Pharmacological VTE agent (>24hrs) due to surgical blood loss or risk of bleeding: no

## 2023-10-31 ENCOUNTER — Inpatient Hospital Stay (HOSPITAL_COMMUNITY): Payer: BC Managed Care – PPO

## 2023-10-31 ENCOUNTER — Other Ambulatory Visit: Payer: Self-pay | Admitting: Surgical

## 2023-10-31 ENCOUNTER — Encounter (HOSPITAL_COMMUNITY): Payer: Self-pay | Admitting: Thoracic Surgery (Cardiothoracic Vascular Surgery)

## 2023-10-31 LAB — BASIC METABOLIC PANEL
Anion gap: 11 (ref 5–15)
BUN: 18 mg/dL (ref 8–23)
CO2: 22 mmol/L (ref 22–32)
Calcium: 8.9 mg/dL (ref 8.9–10.3)
Chloride: 105 mmol/L (ref 98–111)
Creatinine, Ser: 1.09 mg/dL (ref 0.61–1.24)
GFR, Estimated: >60 mL/min (ref >60)
Glucose, Bld: 143 mg/dL — ABNORMAL HIGH (ref 70–99)
Potassium: 4 mmol/L (ref 3.5–5.1)
Sodium: 138 mmol/L (ref 135–145)

## 2023-10-31 LAB — CBC
HCT: 39.2 % (ref 39.0–52.0)
Hemoglobin: 12.3 g/dL — ABNORMAL LOW (ref 13.0–17.0)
MCH: 24 pg — ABNORMAL LOW (ref 26.0–34.0)
MCHC: 31.4 g/dL (ref 30.0–36.0)
MCV: 76.4 fL — ABNORMAL LOW (ref 80.0–100.0)
Platelets: 289 10*3/uL (ref 150–400)
RBC: 5.13 MIL/uL (ref 4.22–5.81)
RDW: 15.1 % (ref 11.5–15.5)
WBC: 12.5 10*3/uL — ABNORMAL HIGH (ref 4.0–10.5)
nRBC: 0 % (ref 0.0–0.2)

## 2023-10-31 MED ORDER — HYDROCHLOROTHIAZIDE 12.5 MG PO TABS
6.2500 mg | ORAL_TABLET | Freq: Every day | ORAL | Status: DC
  Administered 2023-10-31: 6.25 mg via ORAL
  Filled 2023-10-31: qty 1

## 2023-10-31 MED ORDER — BISOPROLOL-HYDROCHLOROTHIAZIDE 5-6.25 MG PO TABS: 1.0000 | ORAL_TABLET | Freq: Every day | ORAL | Status: DC

## 2023-10-31 MED ORDER — DULOXETINE HCL 60 MG PO CPEP: 60.0000 mg | ORAL_CAPSULE | Freq: Every day | ORAL | Status: DC

## 2023-10-31 MED ORDER — LORATADINE 10 MG PO TABS
10.0000 mg | ORAL_TABLET | Freq: Every day | ORAL | Status: DC
  Administered 2023-10-31: 10 mg via ORAL
  Filled 2023-10-31: qty 1

## 2023-10-31 MED ORDER — ASPIRIN 81 MG PO CHEW
81.0000 mg | CHEWABLE_TABLET | Freq: Every day | ORAL | Status: DC
  Administered 2023-10-31: 81 mg via ORAL
  Filled 2023-10-31: qty 1

## 2023-10-31 MED ORDER — DOXAZOSIN MESYLATE 4 MG PO TABS
4.0000 mg | ORAL_TABLET | Freq: Every day | ORAL | Status: DC
  Administered 2023-10-31: 4 mg via ORAL
  Filled 2023-10-31: qty 1

## 2023-10-31 MED ORDER — HYDROCODONE-ACETAMINOPHEN 7.5-325 MG/15ML PO SOLN
10.0000 mL | Freq: Four times a day (QID) | ORAL | Status: DC | PRN
  Administered 2023-10-31: 10 mL via ORAL
  Filled 2023-10-31: qty 15

## 2023-10-31 MED ORDER — ASPIRIN 81 MG PO TBEC
81.0000 mg | DELAYED_RELEASE_TABLET | Freq: Every day | ORAL | Status: DC
  Filled 2023-10-31: qty 1

## 2023-10-31 MED ORDER — IOHEXOL 300 MG/ML  SOLN: 100.0000 mL | Freq: Once | INTRAMUSCULAR | Status: DC | PRN

## 2023-10-31 MED ORDER — BISOPROLOL FUMARATE 5 MG PO TABS
5.0000 mg | ORAL_TABLET | Freq: Every day | ORAL | Status: DC
  Administered 2023-10-31: 5 mg via ORAL
  Filled 2023-10-31: qty 1

## 2023-10-31 MED ORDER — HYDROCODONE-ACETAMINOPHEN 7.5-325 MG/15ML PO SOLN: 10.0000 mL | Freq: Four times a day (QID) | ORAL | 0 refills | Status: AC | PRN

## 2023-10-31 NOTE — Plan of Care (Signed)
  Problem: Education: Goal: Knowledge of General Education information will improve Description: Including pain rating scale, medication(s)/side effects and non-pharmacologic comfort measures Outcome: Progressing   Problem: Health Behavior/Discharge Planning: Goal: Ability to manage health-related needs will improve Outcome: Progressing   Problem: Clinical Measurements: Goal: Will remain free from infection Outcome: Progressing   Problem: Activity: Goal: Risk for activity intolerance will decrease Outcome: Progressing   Problem: Coping: Goal: Level of anxiety will decrease Outcome: Progressing   

## 2023-10-31 NOTE — Progress Notes (Signed)
1 Day Post-Op Procedure(s) (LRB): XI ROBOTIC ASSISTED PARAESOPHAGEAL HERNIA REPAIR (N/A) ESOPHAGOGASTRODUODENOSCOPY (EGD) (N/A) Subjective: Esophagram  appears ok   Objective: Vital signs in last 24 hours: Temp:  [97 F (36.1 C)-100.2 F (37.9 C)] 97.6 F (36.4 C) (12/06 0700) Pulse Rate:  [58-107] 81 (12/06 0700) Cardiac Rhythm: Normal sinus rhythm (12/06 0800) Resp:  [15-21] 20 (12/06 0700) BP: (127-171)/(63-79) 156/67 (12/06 0700) SpO2:  [91 %-98 %] 93 % (12/06 0700) FiO2 (%):  [30 %] 30 % (12/05 2245)  Hemodynamic parameters for last 24 hours:    Intake/Output from previous day: 12/05 0701 - 12/06 0700 In: 1750 [I.V.:1700; IV Piggyback:50] Out: 250 [Urine:250] Intake/Output this shift: No intake/output data recorded.  General appearance: alert, cooperative, and no distress Heart: regular rate and rhythm Lungs: clear to auscultation bilaterally Abdomen: soft, non distended Extremities: warm well perfused Wound: incis healing well  Lab Results: Recent Labs    10/31/23 0158  WBC 12.5*  HGB 12.3*  HCT 39.2  PLT 289   BMET:  Recent Labs    10/31/23 0158  NA 138  K 4.0  CL 105  CO2 22  GLUCOSE 143*  BUN 18  CREATININE 1.09  CALCIUM 8.9    PT/INR: No results for input(s): "LABPROT", "INR" in the last 72 hours. ABG No results found for: "PHART", "HCO3", "TCO2", "ACIDBASEDEF", "O2SAT" CBG (last 3)  Recent Labs    10/30/23 1018 10/30/23 1957  GLUCAP 132* 213*    Meds Scheduled Meds:  acetaminophen  1,000 mg Oral Q6H   Or   acetaminophen (TYLENOL) oral liquid 160 mg/5 mL  1,000 mg Oral Q6H   bisacodyl  10 mg Oral Daily   enoxaparin (LOVENOX) injection  40 mg Subcutaneous Daily   ketorolac  15 mg Intravenous Q6H   pantoprazole  40 mg Oral Daily   senna-docusate  1 tablet Oral QHS   Continuous Infusions:  sodium chloride Stopped (10/31/23 0948)   PRN Meds:.hydrALAZINE, iohexol, lip balm, morphine injection, ondansetron (ZOFRAN) IV,  phenol  Xrays DG Chest Port 1 View  Result Date: 10/31/2023 CLINICAL DATA:  Status post paraesophageal hernia repair. EXAM: PORTABLE CHEST 1 VIEW COMPARISON:  October 30, 2023. FINDINGS: Stable cardiomediastinal silhouette. Stable pleural drain seen in right lung base. No significant residual effusion is noted. No definite pneumothorax is noted. IMPRESSION: Stable right lung pleural drain. No significant effusion or pneumothorax is noted. Electronically Signed   By: Lupita Raider M.D.   On: 10/31/2023 09:31   DG Chest Port 1 View  Result Date: 10/30/2023 CLINICAL DATA:  Status post repair paraesophageal hernia. EXAM: PORTABLE CHEST 1 VIEW COMPARISON:  10/28/2023. FINDINGS: The heart is mildly enlarged and mediastinal contours are within normal limits. Lung volumes are low with airspace disease at the lung bases. There is mild airspace disease in the perihilar region on the left. A right-sided chest tube is in place. No effusion or pneumothorax is seen. No acute osseous abnormality. IMPRESSION: 1. Mild airspace disease at the lung bases bilaterally and in the perihilar region on the left, possible atelectasis or infiltrate. 2. Right-sided chest tube in place. No effusion or pneumothorax is seen. Electronically Signed   By: Thornell Sartorius M.D.   On: 10/30/2023 23:51    Assessment/Plan: S/P Procedure(s) (LRB): XI ROBOTIC ASSISTED PARAESOPHAGEAL HERNIA REPAIR (N/A) ESOPHAGOGASTRODUODENOSCOPY (EGD) (N/A)  1 afeb, s BP 120's-170's, will resume preop meds 2 sats good on RA 3 Chest drain out 4 esophagram ok to my read- start D1 diet 5  adeq UOP, normal renal fxn normal 6 mild reactive leukocytosis 7 HCT normal 8 poss home later today    LOS: 1 day    Rowe Clack PA-C Pager [phone] 10/31/2023

## 2023-10-31 NOTE — Care Management (Signed)
  Transition of Care [facility]) Screening Note   Patient Details  Name: Terrail Zweifel Date of Birth: 1961/08/15   Transition of Care [facility]) CM/SW Contact:    Lockie Pares, RN Phone Number: 10/31/2023, 12:16 PM    Transition of Care Department [facility]) has reviewed patient and no TOC needs have been identified at this time. We will continue to monitor patient advancement through interdisciplinary progression rounds. If new patient transition needs arise, please place a TOC consult.

## 2023-10-31 NOTE — Discharge Summary (Signed)
Physician Discharge Summary       [address]       [name] [address]             [phone]    Patient ID: Timothy [name] MRN: [id number] DOB/AGE: [birth year] 62 y.o.  Admit date: 10/30/2023 Discharge date: 11/03/2023  Admission Diagnoses:  Discharge Diagnoses:  Principal Problem:   Paraesophageal hernia Active Problems:   S/P robot-assisted surgical procedure   Consults: None  Procedure (s): 10/30/2023   Patient:  Timothy [name] Pre-Op Dx: Paraesophageal hernia Post-op Dx: Same Procedure: - Esophagoscopy - Robotic assisted laparoscopy - Paraesophageal hernia repair      Surgeon and Role:      * Corliss Skains, MD - Primary Assistant: Doree Fudge PA-C, Jillyn Hidden, PA-C  HPI: This is a 62 y.o. male who was seen in the office by Dr. 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  He denies any dysphagia, but has been on Prilosec for several years.  He has been having some lightheadedness, as well as headaches. Per Dr. Cliffton Asters, given his weight he is at a slightly higher risk of recurrence, but given his history of GI bleed due to the Cameron's ulcers Dr. Cliffton Asters thought that robotic assisted repair was warranted. Potential risks, benefits, and complications of the surgery were discussed with the patient and he agreed to proceed with surgery.  Hospital Course: Patient underwent an esophagogastroduodenoscopy and robotic assisted laparoscopy para esophageal hernia repair with Ovitex mesh and pledgets. Patient was extubated and transported from the OR to PACU in stable condition. He remained NPO. Swallow study was done 10/31/2023. Results showed no evidence of contrast leak.  He  was started on a dysphagia 1 diet which she tolerated and overall was felt to be stable for discharge on postop day #1.    Latest Vital Signs: Blood pressure 125/65, pulse 72, temperature 97.6 F (36.4 C), temperature source Oral, resp. rate 18, height 5\' 10"  (1.778 m), weight 118.8 kg, SpO2 94%.  Physical Exam:General appearance: alert, cooperative, and no distress Heart: regular rate and rhythm Lungs: clear to auscultation bilaterally Abdomen: soft, non distended Extremities: warm well perfused Wound: incis healing well  Discharge Condition:good  Recent laboratory studies:  Lab Results  Component Value Date   WBC 12.5 (H) 10/31/2023   HGB 12.3 (L) 10/31/2023   HCT 39.2 10/31/2023   MCV 76.4 (L) 10/31/2023   PLT 289 10/31/2023   Lab Results  Component Value Date   NA 138 10/31/2023   K 4.0 10/31/2023   CL 105 10/31/2023   CO2 22 10/31/2023   CREATININE 1.09 10/31/2023   GLUCOSE 143 (H) 10/31/2023      Diagnostic Studies: DG ESOPHAGUS W SINGLE CM (SOL OR THIN BA)  Result Date: 10/31/2023 CLINICAL DATA:  62 year old male s/p paraesophageal hernia repair 10/30/23. Post-op barium swallow requested. EXAM: ESOPHAGUS/BARIUM SWALLOW/TABLET STUDY TECHNIQUE: Single contrast examination was performed using Omnipaque 300. This exam was performed by Loyce Dys PA-C, and was supervised and interpreted by Acquanetta Belling, MD. FLUOROSCOPY: Radiation Exposure Index (as provided by the fluoroscopic device): 30.1 mGy Kerma COMPARISON:  CT CHEST 09/23/23 FINDINGS: Fluoroscopic evaluation demonstrates normal esophagram without evidence of contrast extravasation. Tertiary contractions noted.  Contrast fills the stomach. No hiatal hernia. IMPRESSION: No evidence of contrast leak. Electronically Signed   By: Acquanetta Belling M.D.   On: 10/31/2023 13:17   DG Chest 2 View  Result Date: 10/31/2023 CLINICAL DATA:  Preop for hernia repair. EXAM: CHEST - 2 VIEW COMPARISON:  May 15, 2021. FINDINGS: Stable  cardiomediastinal silhouette. Lungs are clear. Stable large hiatal hernia. Bony thorax is unremarkable. IMPRESSION: No active cardiopulmonary disease. Electronically Signed   By: Lupita Raider M.D.   On: 10/31/2023 09:33   DG Chest Port 1 View  Result Date: 10/31/2023 CLINICAL DATA:  Status post paraesophageal hernia repair. EXAM: PORTABLE CHEST 1 VIEW COMPARISON:  October 30, 2023. FINDINGS: Stable cardiomediastinal silhouette. Stable pleural drain seen in right lung base. No significant residual effusion is noted. No definite pneumothorax is noted. IMPRESSION: Stable right lung pleural drain. No significant effusion or pneumothorax is noted. Electronically Signed   By: Lupita Raider M.D.   On: 10/31/2023 09:31   DG Chest Port 1 View  Result Date: 10/30/2023 CLINICAL DATA:  Status post repair paraesophageal hernia. EXAM: PORTABLE CHEST 1 VIEW COMPARISON:  10/28/2023. FINDINGS: The heart is mildly enlarged and mediastinal contours are within normal limits. Lung volumes are low with airspace disease at the lung bases. There is mild airspace disease in the perihilar region on the left. A right-sided chest tube is in place. No effusion or pneumothorax is seen. No acute osseous abnormality. IMPRESSION: 1. Mild airspace disease at the lung bases bilaterally and in the perihilar region on the left, possible atelectasis or infiltrate. 2. Right-sided chest tube in place. No effusion or pneumothorax is seen. Electronically Signed   By: Thornell Sartorius M.D.   On: 10/30/2023 23:51       Discharge Instructions     Discharge patient   Complete by: As directed    After he eats if tolerates diet, if he does not please contact me   Deniece Portela Tannie Koskela PA-C, pager [phone]  thanks   Discharge disposition: 01-Home or Self Care   Discharge patient date: 10/31/2023       Discharge Medications: Allergies as of 10/31/2023   No Known Allergies      Medication List     STOP taking these medications     naproxen sodium 220 MG tablet Commonly known as: ALEVE       TAKE these medications    acetaminophen 500 MG tablet Commonly known as: TYLENOL Take 1,000 mg by mouth every 6 (six) hours as needed for moderate pain (pain score 4-6).   Alpha-Lipoic Acid 600 MG Caps Take 600 mg by mouth daily.   aspirin EC 81 MG tablet Take 81 mg by mouth daily.   bisoprolol-hydrochlorothiazide 5-6.25 MG tablet Commonly known as: ZIAC TAKE 1 TABLET DAILY. Please make overdue appt with Dr. Anne Fu before anymore refills. 2nd attempt   cetirizine 10 MG tablet Commonly known as: ZYRTEC Take 10 mg by mouth daily.   doxazosin 4 MG tablet Commonly known as: CARDURA Take 4 mg by mouth daily.   DULoxetine 60 MG capsule Commonly known as: CYMBALTA Take 1 capsule (60 mg total) by mouth daily.   HYDROcodone-acetaminophen 7.5-325 mg/15 ml solution Commonly known as: HYCET Take 10 mLs by mouth every 6 (six) hours as needed for up to 3 days for moderate pain (pain score 4-6).   metFORMIN 500 MG tablet Commonly known as: GLUCOPHAGE Take 500 mg by mouth 2 (two) times daily with a meal.  MISC NATURAL PRODUCTS PO Take 1 tablet by mouth with breakfast, with lunch, and with evening meal. GOLO supplement   multivitamin tablet Take 1 tablet by mouth daily.   omeprazole 40 MG capsule Commonly known as: PRILOSEC Take 1 capsule (40 mg total) by mouth 2 (two) times daily before a meal.   pravastatin 80 MG tablet Commonly known as: PRAVACHOL Take 80 mg by mouth daily.   VITAMIN D (CHOLECALCIFEROL) PO Take 1 capsule by mouth daily.   Zinc 50 MG Tabs Take by mouth.        Follow Up Appointments:  Follow-up Information     Lightfoot, Eliezer Lofts, MD Follow up.   Specialty: Cardiothoracic Surgery Why: Please see your discharge paperwork for details of follow-up appointment with surgeon. Contact information: [address] [place] [address] [phone]                  Signed: Rowe Clack PA-C 11/03/2023, 2:11 PM

## 2023-10-31 NOTE — Progress Notes (Signed)
Chest tube has been removed with no complications,

## 2023-10-31 NOTE — Progress Notes (Signed)
Went over AVS paperwork with patient, answered any/all questions, removed IV, gathered patient belongings, and patient was wheeled out to family vehicle.

## 2023-11-07 DIAGNOSIS — Z125 Encounter for screening for malignant neoplasm of prostate: Secondary | ICD-10-CM | POA: Diagnosis not present

## 2023-11-14 ENCOUNTER — Ambulatory Visit (INDEPENDENT_AMBULATORY_CARE_PROVIDER_SITE_OTHER): Payer: Self-pay | Admitting: Thoracic Surgery (Cardiothoracic Vascular Surgery)

## 2023-11-14 ENCOUNTER — Encounter: Payer: Self-pay | Admitting: Thoracic Surgery (Cardiothoracic Vascular Surgery)

## 2023-11-14 VITALS — BP 134/75 | HR 77 | Resp 20 | Wt 268.0 lb

## 2023-11-14 DIAGNOSIS — Z9889 Other specified postprocedural states: Secondary | ICD-10-CM

## 2023-11-14 NOTE — Progress Notes (Signed)
      [address]       [place] [address]             [phone]        Rulon Sera Steele Medical Record #[id number] Date of Birth: [birth year]  Referring: Larita Fife H, NP Primary Care: Farris Has, MD Primary Cardiologist:Mark Anne Fu, MD  Reason for visit:   follow-up  History of Present Illness:     62 year old male presents for his first follow-up appointment after undergoing paraesophageal hernia repair.  Overall he is doing well.  He has no complaints.  Physical Exam: BP 134/75 (BP Location: Left Arm, Patient Position: Sitting, Cuff Size: Normal)   Pulse 77   Resp 20   Wt 268 lb (121.6 kg)   SpO2 96% Comment: RA  BMI 38.45 kg/m   Alert NAD Abdomen, ND No peripheral edema    Assessment / Plan:   62 year old male status post paraesophageal hernia repair and history of Cameron's ulcers.  He is doing well postoperatively.  He is able to advance his diet, and he will return in 1 month with a chest x-ray.   Corliss Skains 11/14/2023 4:34 PM

## 2023-11-21 DIAGNOSIS — R1310 Dysphagia, unspecified: Secondary | ICD-10-CM

## 2023-11-21 DIAGNOSIS — K219 Gastro-esophageal reflux disease without esophagitis: Secondary | ICD-10-CM

## 2023-12-15 DIAGNOSIS — R3912 Poor urinary stream: Secondary | ICD-10-CM | POA: Diagnosis not present

## 2023-12-15 DIAGNOSIS — R3129 Other microscopic hematuria: Secondary | ICD-10-CM | POA: Diagnosis not present

## 2023-12-15 DIAGNOSIS — Z125 Encounter for screening for malignant neoplasm of prostate: Secondary | ICD-10-CM | POA: Diagnosis not present

## 2023-12-16 ENCOUNTER — Other Ambulatory Visit: Payer: Self-pay | Admitting: Thoracic Surgery (Cardiothoracic Vascular Surgery)

## 2023-12-16 DIAGNOSIS — K449 Diaphragmatic hernia without obstruction or gangrene: Secondary | ICD-10-CM

## 2023-12-18 NOTE — Progress Notes (Signed)
      [address]       [place] [address]             [phone]        Baird Polinski [facility] Medical Record #[id number] Date of Birth: 1961-06-28  Referring: Napoleon Form, MD Primary Care: Farris Has, MD Primary Cardiologist:Mark Anne Fu, MD  Reason for visit:   follow-up  History of Present Illness:     63yo male presents for his 1 month follow-up.  Overall he is doing well.  Denies any reflux or dysphagia  Physical Exam: There were no vitals taken for this visit.  Alert NAD Easy work of breathing Abdomen, ND No peripheral edema   Diagnostic Studies & Laboratory data: CXR: Clear     Assessment / Plan:   63 year old male status post paraesophageal hernia repair and history of Cameron's ulcers. He is doing well postoperatively. He will follow-up in 3 months.   Corliss Skains 12/18/2023 12:58 PM

## 2023-12-19 ENCOUNTER — Encounter: Payer: Self-pay | Admitting: Thoracic Surgery (Cardiothoracic Vascular Surgery)

## 2023-12-19 ENCOUNTER — Ambulatory Visit (INDEPENDENT_AMBULATORY_CARE_PROVIDER_SITE_OTHER): Payer: BC Managed Care – PPO | Admitting: Thoracic Surgery (Cardiothoracic Vascular Surgery)

## 2023-12-19 ENCOUNTER — Ambulatory Visit: Payer: BC Managed Care – PPO | Admitting: Thoracic Surgery (Cardiothoracic Vascular Surgery)

## 2023-12-19 ENCOUNTER — Ambulatory Visit
Admission: RE | Admit: 2023-12-19 | Discharge: 2023-12-19 | Disposition: A | Discharge status: Home / Self Care | Payer: BC Managed Care – PPO | Source: Ambulatory Visit | Attending: Thoracic Surgery (Cardiothoracic Vascular Surgery)

## 2023-12-19 VITALS — BP 129/73 | HR 52 | Resp 20 | Wt 271.0 lb

## 2023-12-19 DIAGNOSIS — K449 Diaphragmatic hernia without obstruction or gangrene: Secondary | ICD-10-CM

## 2023-12-19 DIAGNOSIS — Z09 Encounter for follow-up examination after completed treatment for conditions other than malignant neoplasm: Secondary | ICD-10-CM | POA: Diagnosis not present

## 2023-12-19 DIAGNOSIS — Z9889 Other specified postprocedural states: Secondary | ICD-10-CM

## 2024-03-26 ENCOUNTER — Ambulatory Visit
Attending: Thoracic Surgery (Cardiothoracic Vascular Surgery) | Admitting: Thoracic Surgery (Cardiothoracic Vascular Surgery)

## 2024-03-26 ENCOUNTER — Telehealth: Payer: BC Managed Care – PPO | Admitting: Thoracic Surgery (Cardiothoracic Vascular Surgery)

## 2024-03-26 DIAGNOSIS — Z9889 Other specified postprocedural states: Secondary | ICD-10-CM | POA: Diagnosis not present

## 2024-03-31 NOTE — Progress Notes (Signed)
     [address]       [name] [address]             [phone]       Patient: Home Provider: Office Consent for Telemedicine visit obtained.  Today's visit was completed via a real-time telehealth (see specific modality noted below). The patient/authorized person provided oral consent at the time of the visit to engage in a telemedicine encounter with the present provider at [facility]. The patient/authorized person was informed of the potential benefits, limitations, and risks of telemedicine. The patient/authorized person expressed understanding that the laws that protect confidentiality also apply to telemedicine. The patient/authorized person acknowledged understanding that telemedicine does not provide emergency services and that he or she would need to call 911 or proceed to the nearest hospital for help if such a need arose.   Total time spent in the clinical discussion 10 minutes.  Telehealth Modality: Phone visit (audio only)  I had a telephone visit with Timothy [name].  Overall, he is doing well.  He denies any dysphagia or shortness of breath.  He reflux has resolved.  He will follow-up as needed.

## 2024-04-06 DIAGNOSIS — Z780 Asymptomatic menopausal state: Secondary | ICD-10-CM | POA: Diagnosis not present

## 2024-04-06 DIAGNOSIS — R1310 Dysphagia, unspecified: Secondary | ICD-10-CM | POA: Diagnosis not present

## 2024-04-06 DIAGNOSIS — K219 Gastro-esophageal reflux disease without esophagitis: Secondary | ICD-10-CM | POA: Diagnosis not present

## 2024-04-06 DIAGNOSIS — Z01419 Encounter for gynecological examination (general) (routine) without abnormal findings: Secondary | ICD-10-CM | POA: Diagnosis not present

## 2024-04-06 DIAGNOSIS — Z1331 Encounter for screening for depression: Secondary | ICD-10-CM | POA: Diagnosis not present

## 2024-06-15 ENCOUNTER — Other Ambulatory Visit: Payer: Self-pay | Admitting: *Deleted

## 2024-06-15 DIAGNOSIS — G6289 Other specified polyneuropathies: Secondary | ICD-10-CM

## 2024-06-15 MED ORDER — DULOXETINE HCL 60 MG PO CPEP: 60.0000 mg | ORAL_CAPSULE | Freq: Every day | ORAL | 0 refills | Status: DC

## 2024-06-15 NOTE — Telephone Encounter (Signed)
 Last seen on 06/30/23 Follow up scheduled on 07/05/24

## 2024-06-23 NOTE — Patient Instructions (Signed)
 Below is our plan:  We will continue ALA over the counter and duloxetine  60mg  daily. We can consider reducing duloxetine  dose, slowly, in the future. Continue working on [employer]. Let me know if you need me!  Please make sure you are staying well hydrated. I recommend 50-60 ounces daily. Well balanced diet and regular exercise encouraged. Consistent sleep schedule with 6-8 hours recommended.   Please continue follow up with care team as directed.   Follow up with me in 1 year   You may receive a survey regarding today's visit. I encourage you to leave honest feed back as I do use this information to improve patient care. Thank you for seeing me today!

## 2024-06-23 NOTE — Progress Notes (Signed)
 PATIENT: Timothy [name] DOB: 03-16-1961  REASON FOR VISIT: follow up HISTORY FROM: patient  Chief Complaint  Patient presents with   RM1/POLYNEUROPATHY    Pt is here Alone. Pt states that things have been going very well with his polyneuropathy. Pt states that he would like to discuss his medication and dosage. Pt states that he has finished his Covid medication and is Covid free.      HISTORY OF PRESENT ILLNESS:  07/05/24 ALL: Timothy [name] returns for follow up for neuropathy. He was last seen 06/2023 and doing well on duloxetine  and ALA. Since, he reports continued stability. He had lost about 30lbs but regained some of his weight following a hernia repair. He has restarted GOLO supplements and is losing again. Last A1C 6.1, well controlled. He denies any significant pain in feet. He is tolerating medicine.   06/30/2023 ALL:  Timothy [name] returns for follow up for neuropathy. He was last seen 06/2022 and doing well on duloxetine  and ALA. Since, he reports doing well. He has lost about 30lbs on GOLO diet supplements. He reports A1C is down to 6.3. He does feel neuropathy has improved. He is having some trouble with plantar fasciitis. Exercises seem to help. He is followed closely by PCP.   06/25/2022 ALL: Timothy returns for follow up for neuropathy. He continues duloxetine  60mg  daily and ALA 600mg  daily. Headaches and cognitive fog are better off gabapentin . He feels that burning in feet may also be better since discontinuing. He is working on [employer]. Considering GOLO diet program.   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  May 2022 patient had COVID with malaise, fatigue, cough, fevers.  He was prescribed paxlovid  and had some improvement.  However after few days symptoms returned.  This time he had more severe COVID symptoms.  Also had headaches, brain fog and fatigue.   Patient having 1-2 headaches per day lasting 1 hour at a time.  Headaches occurring on  a daily basis.  Also having significant fatigue and brain fog.  Fortunately his symptoms are gradually proving over time.  He is using Tylenol  with mild relief.  He is sleeping well using CPAP every night.  05/22/2020 ALL: Timothy [name] is a 63 y.o. male here today for follow up for neuropathy. We increased duloxetine  dose to 60mg  daily in 10/2019. He does feel that this has helped. He has continued gabapentin  600mg  at bedtime. Numbness and tingling is still present. It is fairly constant but manageable. He travels for work and may be walking for 12-14 hours. These days are worse. He is no longer wearing a carpal tunnel brace as symptoms have resolved. He is followed regularly by PCP. DM is well controlled. He is feeling well today and without complaints.   HISTORY: (copied from my note on 11/23/2019)  Timothy [name] is a 63 y.o. male here today for follow up for neuropathy. He continues gabapentin  600mg  at night and duloxetine  30mg  daily. He feels that lower extremity numbness and tingling is progressing. Worse on left. Mostly involves bid toe and ball of foot. He does feel that symptoms improved initially with duloxetine . Over the past two months symptoms have worsened. He has also noted left thumb numbness, specifically when he wakes in the mornings. 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 He has a history of prediabetes. Last A1C was 6.    HISTORY: (copied from my note on 03/30/2019)  Timothy [name] is a 63 y.o. male for follow up of neuropathy.  Timothy [name] reports that neuropathy symptoms have steadily increased over the past year.  He reports both numbness and tingling in bilateral lower extremities.  At his last visit nearly a year ago he was advised to increase gabapentin  to 600 mg at night.  He reports this did help for short period of time.  He can definitely tell if he misses his dose of gabapentin .  He continues to be active.  He has not  correlated any worsening or improvement with activity.  He feels that neuropathy is constant and not worse at any particular time.  He is under more stress currently as his daughter has been hospitalized.  He travels frequently and spends a lot of time out of town.  He is followed closely by his PCP who has been monitoring lab work.  He reports labs have been normal.   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  He is currently on gabapentin .  This was started about 1 year ago he says his symptoms have gone away on the right side but he still feels numbness and tingling in his left leg and foot he is only taking gabapentin  300 at night the morning dose makes him too drowsy.  He also has a history of obesity obstructive sleep apnea using CPAP.  He also has an area on his left knee at the back which he says swells at times.  He returns for reevaluation   UPDATE 4/26/2019CM Timothy [name],, 63 year old male returns for follow-up with history of neuropathy in both feet worse over the last 6 months.  He continues to have numbness feelings in the left arm leg and foot which are fairly constant no change in the right side.  He was just started on gabapentin  6 months ago.  He is morbidly obese and have obstructive sleep apnea using CPAP.  His CPAP is followed by primary care.  He continues to work full-time and travels for his job.  He returns for reevaluation   UPDATE 10/25/2018CM Timothy [name], 63 year old male returns for follow-up for small fiber neuropathy.he has begun having daily symptoms of neuropathy in the left foot and lack that has been progressively getting worse over the last year.He has never been on any medication for this.He feels he cannot be as active as previously due to burning pain in the feet. When his activity decreased his weight increased. He was  recently diagnosed with hiatal hernia and Barrett's esophagitis.He has a history of obstructive sleep apnea and uses CPAP.He returns for reevaluation   04/12/16 VP49 year old right-handed male with hypertension, hypercholesteremia, sleep apnea, kidney stones, here for evaluation of back pain and numbness in the feet. Symptoms present for at least 6 months.   For past 6 months has had midline low back pain. Symptoms worse when he sleeps for a long time. Worsened early morning. Patient improved after activity. No radiating symptoms. No specific triggering factors. No history of severe accident or trauma.   Around the same time patient has also noted numbness and tingling in the balls of the feet, left worse than right side. Symptoms seem to be independent from back pain problems. Patient has had lab screening testing with B12, TSH, A1c which were unremarkable.    REVIEW OF SYSTEMS: Out of a complete 14 system review of symptoms,  the patient complains only of the following symptoms, numbness of feet, and all other reviewed systems are negative.   ALLERGIES: No Known Allergies  HOME MEDICATIONS: Outpatient Medications Prior to Visit  Medication Sig Dispense Refill   acetaminophen  (TYLENOL ) 500 MG tablet Take 1,000 mg by mouth every 6 (six) hours as needed for moderate pain (pain score 4-6).     Alpha-Lipoic Acid 600 MG CAPS Take 600 mg by mouth daily.     aspirin  EC 81 MG tablet Take 81 mg by mouth daily.     bisoprolol -hydrochlorothiazide  (ZIAC ) 5-6.25 MG tablet TAKE 1 TABLET DAILY. Please make overdue appt with Dr. Jeffrie before anymore refills. 2nd attempt 15 tablet 0   cetirizine (ZYRTEC) 10 MG tablet Take 10 mg by mouth daily.     doxazosin  (CARDURA ) 4 MG tablet Take 4 mg by mouth daily.     DULoxetine  (CYMBALTA ) 60 MG capsule Take 1 capsule (60 mg total) by mouth daily. 90 capsule 0   metFORMIN (GLUCOPHAGE) 500 MG tablet Take 500 mg by mouth 2 (two) times daily with a meal.     MISC  NATURAL PRODUCTS PO Take 1 tablet by mouth with breakfast, with lunch, and with evening meal. GOLO supplement     Multiple Vitamin (MULTIVITAMIN) tablet Take 1 tablet by mouth daily.     pravastatin (PRAVACHOL) 80 MG tablet Take 80 mg by mouth daily.     VITAMIN D, CHOLECALCIFEROL, PO Take 1 capsule by mouth daily.     Zinc 50 MG TABS Take by mouth.     No facility-administered medications prior to visit.    PAST MEDICAL HISTORY: Past Medical History:  Diagnosis Date   Allergy    Asthma    Barrett's esophagus    BPH (benign prostatic hyperplasia)    Diabetes mellitus without complication (HCC)    Fibromyalgia    GERD (gastroesophageal reflux disease)    History of hiatal hernia    History of kidney stones    Hyperlipidemia    Hypertension    Kidney stones    Peripheral neuropathy    Sleep apnea    CPAP 100%   Small fiber neuropathy     PAST SURGICAL HISTORY: Past Surgical History:  Procedure Laterality Date   arm surgery Right 2005   ESOPHAGEAL MANOMETRY N/A 10/01/2023   Procedure: ESOPHAGEAL MANOMETRY (EM);  Surgeon: Shila Gustav GAILS, MD;  Location: [facility];  Service: Gastroenterology;  Laterality: N/A;   ESOPHAGOGASTRODUODENOSCOPY N/A 10/30/2023   Procedure: ESOPHAGOGASTRODUODENOSCOPY (EGD);  Surgeon: Shyrl Linnie KIDD, MD;  Location: [facility] OR;  Service: Thoracic;  Laterality: N/A;   KNEE SURGERY Bilateral 1976   LIPOMA EXCISION N/A 11/06/2016   Procedure: EXCISION LIPOMA ABDOMINAL WALL;  Surgeon: Mitzie DELENA Freund, Patient denies having this surgery.cation: [facility];  Service: General;  Laterality: N/A;   NASAL SINUS SURGERY  2001   tumor 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 Right    2001-2002   XI ROBOTIC ASSISTED PARAESOPHAGEAL HERNIA REPAIR N/A 10/30/2023   Procedure: XI ROBOTIC ASSISTED PARAESOPHAGEAL HERNIA REPAIR;  Surgeon: Shyrl Linnie KIDD, MD;  Location: [facility] OR;  Service: Thoracic;  Laterality: N/A;    FAMILY HISTORY: Family History  Problem Relation Age of  Onset   Heart disease Mother    Heart disease Father    Diabetes Father    Skin cancer Father    Valvular heart disease Brother    Heart disease Paternal Grandmother    Heart disease Paternal Grandfather    Chiari malformation Daughter  Other Daughter        Noonan syndrome   Colon cancer Neg Hx    Stomach cancer Neg Hx    Rectal cancer Neg Hx    Esophageal cancer Neg Hx     SOCIAL HISTORY: Social History   Socioeconomic History   Marital status: Married    Spouse name: Sandrea   Number of children: 2   Years of education: 16   Highest education level: Not on file  Occupational History   Occupation: Corporate investment banker    Comment: [place] [employer]  Tobacco Use   Smoking status: Never   Smokeless tobacco: Never  Vaping Use   Vaping status: Never Used  Substance and Sexual Activity   Alcohol use: Yes    Alcohol/week: 0.0 standard drinks of alcohol    Comment: 2 per month   Drug use: No   Sexual activity: Not on file  Other Topics Concern   Not on file  Social History Narrative   Lives with wife   Caffeine use- coffee 2 cups daily   Social Drivers of Health   Financial Resource Strain: Not on file  Food Insecurity: No Food Insecurity (10/30/2023)   Hunger Vital Sign    Worried About Running Out of Food in the Last Year: Never true    Ran Out of Food in the Last Year: Never true  Transportation Needs: No Transportation Needs (10/30/2023)   PRAPARE - Administrator, Civil Service (Medical): No    Lack of Transportation (Non-Medical): No  Physical Activity: Not on file  Stress: Not on file  Social Connections: Not on file  Intimate Partner Violence: Not At Risk (10/30/2023)   Humiliation, Afraid, Rape, and Kick questionnaire    Fear of Current or Ex-Partner: No    Emotionally Abused: No    Physically Abused: No    Sexually Abused: No      PHYSICAL EXAM  Vitals:   07/05/24 0947  BP: 135/66  Pulse: (!) 52  SpO2: 99%  Weight: 273 lb  (123.8 kg)  Height: 5' 10 (1.778 m)      Body mass index is 39.17 kg/m.  Generalized: Well developed, in no acute distress  Cardiology: normal rate and rhythm, no murmur noted Respiratory: clear to auscultation bilaterally  Neurological examination  Mentation: Alert oriented to time, place, history taking. Follows all commands speech and language fluent Cranial nerve II-XII: Pupils were equal round reactive to light. Extraocular movements were full, visual field were full  Motor: The motor testing reveals 5 over 5 strength of all 4 extremities. Good symmetric motor tone is noted throughout.  Sensory: Sensory testing is intact to soft touch on all 4 extremities. No evidence of extinction is noted.   Gait and station: Gait is normal.   DIAGNOSTIC DATA (LABS, IMAGING, TESTING) - I reviewed patient records, labs, notes, testing and imaging myself where available.      No data to display           Lab Results  Component Value Date   WBC 12.5 (H) 10/31/2023   HGB 12.3 (L) 10/31/2023   HCT 39.2 10/31/2023   MCV 76.4 (L) 10/31/2023   PLT 289 10/31/2023      Component Value Date/Time   NA 138 10/31/2023 0158   NA 142 11/09/2018 1013   K 4.0 10/31/2023 0158   CL 105 10/31/2023 0158   CO2 22 10/31/2023 0158   GLUCOSE 143 (H) 10/31/2023 0158   BUN 18  10/31/2023 0158   BUN 13 11/09/2018 1013   CREATININE 1.09 10/31/2023 0158   CALCIUM 8.9 10/31/2023 0158   PROT 6.8 10/28/2023 0903   ALBUMIN 3.6 10/28/2023 0903   AST 24 10/28/2023 0903   ALT 20 10/28/2023 0903   ALKPHOS 74 10/28/2023 0903   BILITOT 0.4 10/28/2023 0903   GFRNONAA >60 10/31/2023 0158   GFRAA >60 06/01/2020 1553   No results found for: CHOL, HDL, LDLCALC, LDLDIRECT, TRIG, CHOLHDL Lab Results  Component Value Date   HGBA1C 6.1 (H) 10/28/2023   No results found for: VITAMINB12 No results found for: TSH     ASSESSMENT AND PLAN 63 y.o. year old male  has a past medical history of  Allergy, Asthma, Barrett's esophagus, BPH (benign prostatic hyperplasia), Diabetes mellitus without complication (HCC), Fibromyalgia, GERD (gastroesophageal reflux disease), History of hiatal hernia, History of kidney stones, Hyperlipidemia, Hypertension, Kidney stones, Peripheral neuropathy, Sleep apnea, and Small fiber neuropathy. here with     ICD-10-CM   1. Other polyneuropathy  G62.89       Shakil is doing well today.  We will continue duloxetine  60 mg and ALA 600mg  daily. He will continue close follow-up with primary care for management of comorbidities. May consider weaning duloxetine  in the future if he wishes. Regular exercise and well-balanced diet advised.  He will follow-up with me in 1 year, sooner if needed.  He verbalizes understanding and agreement with this plan.   No orders of the defined types were placed in this encounter.    No orders of the defined types were placed in this encounter.    I personally spent a total of 30 minutes in the care of the patient today including preparing to see the patient, getting/reviewing separately obtained history, performing a medically appropriate exam/evaluation, counseling and educating, placing orders, documenting clinical information in the EHR, independently interpreting results, and communicating results.   Greig Forbes, FNP-C 07/05/2024, 9:52 AM [facility] [address] [place], [place] [address] [phone]

## 2024-07-05 ENCOUNTER — Ambulatory Visit (INDEPENDENT_AMBULATORY_CARE_PROVIDER_SITE_OTHER): Payer: BC Managed Care – PPO | Admitting: Family Medicine

## 2024-07-05 ENCOUNTER — Encounter: Payer: Self-pay | Admitting: Family Medicine

## 2024-07-05 VITALS — BP 135/66 | HR 52 | Ht 70.0 in | Wt 273.0 lb

## 2024-07-05 DIAGNOSIS — G6289 Other specified polyneuropathies: Secondary | ICD-10-CM

## 2024-07-05 MED ORDER — DULOXETINE HCL 60 MG PO CPEP
60.0000 mg | ORAL_CAPSULE | Freq: Every day | ORAL | 3 refills | Status: AC
Start: 2024-07-05 — End: 2025-09-28

## 2024-08-02 DIAGNOSIS — E119 Type 2 diabetes mellitus without complications: Secondary | ICD-10-CM | POA: Diagnosis not present

## 2024-08-02 DIAGNOSIS — N401 Enlarged prostate with lower urinary tract symptoms: Secondary | ICD-10-CM | POA: Diagnosis not present

## 2024-08-02 DIAGNOSIS — I10 Essential (primary) hypertension: Secondary | ICD-10-CM | POA: Diagnosis not present

## 2024-08-02 DIAGNOSIS — E785 Hyperlipidemia, unspecified: Secondary | ICD-10-CM | POA: Diagnosis not present

## 2024-09-01 DIAGNOSIS — Z6841 Body Mass Index (BMI) 40.0 and over, adult: Secondary | ICD-10-CM | POA: Diagnosis not present

## 2024-09-01 DIAGNOSIS — G4733 Obstructive sleep apnea (adult) (pediatric): Secondary | ICD-10-CM | POA: Diagnosis not present

## 2024-09-01 DIAGNOSIS — E119 Type 2 diabetes mellitus without complications: Secondary | ICD-10-CM | POA: Diagnosis not present

## 2024-09-24 DIAGNOSIS — Z6841 Body Mass Index (BMI) 40.0 and over, adult: Secondary | ICD-10-CM | POA: Diagnosis not present

## 2024-09-24 DIAGNOSIS — E119 Type 2 diabetes mellitus without complications: Secondary | ICD-10-CM | POA: Diagnosis not present

## 2024-09-24 DIAGNOSIS — K760 Fatty (change of) liver, not elsewhere classified: Secondary | ICD-10-CM | POA: Diagnosis not present

## 2024-09-24 DIAGNOSIS — I10 Essential (primary) hypertension: Secondary | ICD-10-CM | POA: Diagnosis not present

## 2024-10-29 ENCOUNTER — Telehealth: Payer: Self-pay

## 2024-10-29 NOTE — Telephone Encounter (Signed)
 Patient calls to schedule his screening colonoscopy. He reports no history of colon polyps. Denies any concerns with his bowel movements. Patient is on Ozempic. Appointment for pre-visit and colonoscopy scheduled. He understands to be on a low fiber diet five days prior to his procedure date. He is instructed to not take Ozempic for 7 days prior to his procedure date. He takes Ozempic injections on Sundays. Last Ozempic injection will be 11/28/24. Pre-visit 12/03/24. Colonoscopy 12/06/24.

## 2024-11-02 DIAGNOSIS — D1801 Hemangioma of skin and subcutaneous tissue: Secondary | ICD-10-CM | POA: Diagnosis not present

## 2024-11-02 DIAGNOSIS — L821 Other seborrheic keratosis: Secondary | ICD-10-CM | POA: Diagnosis not present

## 2024-11-02 DIAGNOSIS — L814 Other melanin hyperpigmentation: Secondary | ICD-10-CM | POA: Diagnosis not present

## 2024-11-02 DIAGNOSIS — D485 Neoplasm of uncertain behavior of skin: Secondary | ICD-10-CM | POA: Diagnosis not present

## 2024-11-05 DIAGNOSIS — E119 Type 2 diabetes mellitus without complications: Secondary | ICD-10-CM | POA: Diagnosis not present

## 2024-11-05 DIAGNOSIS — G629 Polyneuropathy, unspecified: Secondary | ICD-10-CM | POA: Diagnosis not present

## 2024-11-05 DIAGNOSIS — K760 Fatty (change of) liver, not elsewhere classified: Secondary | ICD-10-CM | POA: Diagnosis not present

## 2024-11-22 DIAGNOSIS — M7989 Other specified soft tissue disorders: Secondary | ICD-10-CM | POA: Diagnosis not present

## 2024-11-29 ENCOUNTER — Other Ambulatory Visit: Payer: Self-pay | Admitting: Medical Genetics

## 2024-12-03 ENCOUNTER — Encounter: Payer: Self-pay | Admitting: Gastroenterology

## 2024-12-03 ENCOUNTER — Telehealth

## 2024-12-03 VITALS — Ht 70.0 in | Wt 270.0 lb

## 2024-12-03 DIAGNOSIS — Z1211 Encounter for screening for malignant neoplasm of colon: Secondary | ICD-10-CM

## 2024-12-03 MED ORDER — NA SULFATE-K SULFATE-MG SULF 17.5-3.13-1.6 GM/177ML PO SOLN: 1.0000 | Freq: Once | ORAL | 0 refills | Status: AC

## 2024-12-03 NOTE — Progress Notes (Signed)
 Pre visit completed via video call; Patient verified name, DOB, and address; No egg or soy allergy known to patient;  No issues known to pt with past sedation with any surgeries or procedures; Patient denies ever being told they had issues or difficulty with intubation;  No FH of Malignant Hyperthermia; Pt is not on diet pills; Pt is not on home 02;  Pt is not on blood thinners;  Pt reports issues with constipation since starting Ozempic- started taking Dulcolax as needed for relief of constipation; RN advised patient to increase oral fluids, fruits/veggies, activity as tolerated and to also take the OTC medication if needed; No A fib or A flutter; Have any cardiac testing pending--NO Insurance verified during PV appt--- BCBS Pt can ambulate without assistance;  Pt denies use of chewing tobacco; Discussed diabetic/weight loss medication holds; Discussed NSAID holds; Checked BMI to be less than 50; Pt instructed to use Single[employer].com or GoodRx for a price reduction on prep;  Patient's chart reviewed by Norleen Schillings CNRA prior to previsit and patient appropriate for the [facility];  Pre visit completed and red dot placed by patient's name on their procedure day (on provider's schedule);   Instructions sent to MyChart per patient request;  Patient reports he has a lump on the LEFT side of his neck and is having an ultrasound on this on 12/24/2024; just wanting anesthesia to know about it;

## 2024-12-07 ENCOUNTER — Encounter: Payer: Self-pay | Admitting: Gastroenterology

## 2024-12-07 ENCOUNTER — Ambulatory Visit: Admitting: Gastroenterology

## 2024-12-07 VITALS — BP 96/44 | HR 52 | Temp 98.1°F | Resp 13 | Ht 70.0 in | Wt 270.0 lb

## 2024-12-07 DIAGNOSIS — Z1211 Encounter for screening for malignant neoplasm of colon: Secondary | ICD-10-CM

## 2024-12-07 DIAGNOSIS — K648 Other hemorrhoids: Secondary | ICD-10-CM | POA: Diagnosis not present

## 2024-12-07 DIAGNOSIS — K5730 Diverticulosis of large intestine without perforation or abscess without bleeding: Secondary | ICD-10-CM | POA: Diagnosis not present

## 2024-12-07 DIAGNOSIS — K644 Residual hemorrhoidal skin tags: Secondary | ICD-10-CM | POA: Diagnosis not present

## 2024-12-07 DIAGNOSIS — D120 Benign neoplasm of cecum: Secondary | ICD-10-CM | POA: Diagnosis not present

## 2024-12-07 DIAGNOSIS — K635 Polyp of colon: Secondary | ICD-10-CM | POA: Diagnosis not present

## 2024-12-07 DIAGNOSIS — D123 Benign neoplasm of transverse colon: Secondary | ICD-10-CM | POA: Diagnosis not present

## 2024-12-07 MED ORDER — SODIUM CHLORIDE 0.9 % IV SOLN: 500.0000 mL | Freq: Once | INTRAVENOUS | Status: DC

## 2024-12-07 NOTE — Progress Notes (Unsigned)
 Vss nad trans to pacu

## 2024-12-07 NOTE — Patient Instructions (Signed)
 Handouts given: Polyps, Hemorrhoids, Diverticulosis Resume previous diet. Continue present medications.  Await pathology results. Repeat colonoscopy in 5-10 years for surveillance based on pathology results.  YOU HAD AN ENDOSCOPIC PROCEDURE TODAY AT THE [facility]:   Refer to the procedure report that was given to you for any specific questions about what was found during the examination.  If the procedure report does not answer your questions, please call your gastroenterologist to clarify.  If you requested that your care partner not be given the details of your procedure findings, then the procedure report has been included in a sealed envelope for you to review at your convenience later.  YOU SHOULD EXPECT: Some feelings of bloating in the abdomen. Passage of more gas than usual.  Walking can help get rid of the air that was put into your GI tract during the procedure and reduce the bloating. If you had a lower endoscopy (such as a colonoscopy or flexible sigmoidoscopy) you may notice spotting of blood in your stool or on the toilet paper. If you underwent a bowel prep for your procedure, you may not have a normal bowel movement for a few days.  Please Note:  You might notice some irritation and congestion in your nose or some drainage.  This is from the oxygen used during your procedure.  There is no need for concern and it should clear up in a day or so.  SYMPTOMS TO REPORT IMMEDIATELY:  Following lower endoscopy (colonoscopy or flexible sigmoidoscopy):  Excessive amounts of blood in the stool  Significant tenderness or worsening of abdominal pains  Swelling of the abdomen that is new, acute  Fever of 100F or higher  For urgent or emergent issues, a gastroenterologist can be reached at any hour by calling [phone]. Do not use MyChart messaging for urgent concerns.    DIET:  We do recommend a small meal at first, but then you may proceed to your regular diet.  Drink  plenty of fluids but you should avoid alcoholic beverages for 24 hours.  ACTIVITY:  You should plan to take it easy for the rest of today and you should NOT DRIVE or use heavy machinery until tomorrow (because of the sedation medicines used during the test).    FOLLOW UP: Our staff will call the number listed on your records the next business day following your procedure.  We will call around 7:15- 8:00 am to check on you and address any questions or concerns that you may have regarding the information given to you following your procedure. If we do not reach you, we will leave a message.     If any biopsies were taken you will be contacted by phone or by letter within the next 1-3 weeks.  Please call us  at [phone] if you have not heard about the biopsies in 3 weeks.    SIGNATURES/CONFIDENTIALITY: You and/or your care partner have signed paperwork which will be entered into your electronic medical record.  These signatures attest to the fact that that the information above on your After Visit Summary has been reviewed and is understood.  Full responsibility of the confidentiality of this discharge information lies with you and/or your care-partner.

## 2024-12-07 NOTE — Progress Notes (Unsigned)
 [facility] History and Physical   Primary Care Physician:  Kip Righter, MD   Reason for Procedure:  Colorectal cancer screening  Plan:    Screening colonoscopy with possible interventions as needed     HPI: Timothy [name] is a very pleasant 64 y.o. male here for screening colonoscopy. Denies any nausea, vomiting, abdominal pain, melena or bright red blood per rectum  The risks and benefits as well as alternatives of endoscopic procedure(s) have been discussed and reviewed.  The patient was provided an opportunity to ask questions and all were answered. The patient agreed with the plan and demonstrated an understanding of the instructions.   Past Medical History:  Diagnosis Date   Allergy    Asthma    Barrett's esophagus    BPH (benign prostatic hyperplasia)    Diabetes mellitus without complication (HCC)    Fibromyalgia    GERD (gastroesophageal reflux disease)    hx of   History of hiatal hernia    History of kidney stones    Hyperlipidemia    Hypertension    Kidney stones    Peripheral neuropathy    Sleep apnea    CPAP 100%   Small fiber neuropathy     Past Surgical History:  Procedure Laterality Date   arm surgery Right 2005   ESOPHAGEAL MANOMETRY N/A 10/01/2023   Procedure: ESOPHAGEAL MANOMETRY (EM);  Surgeon: Shila Gustav GAILS, MD;  Location: [facility];  Service: Gastroenterology;  Laterality: N/A;   ESOPHAGOGASTRODUODENOSCOPY N/A 10/30/2023   Procedure: ESOPHAGOGASTRODUODENOSCOPY (EGD);  Surgeon: Shyrl Linnie KIDD, MD;  Location: [facility] OR;  Service: Thoracic;  Laterality: N/A;   KNEE SURGERY Bilateral 1976   LIPOMA EXCISION N/A 11/06/2016   Procedure: EXCISION LIPOMA ABDOMINAL WALL;  Surgeon: Mitzie DELENA Freund, Patient denies having this surgery.cation: [facility];  Service: General;  Laterality: N/A;   NASAL SINUS SURGERY  2001   tumor 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 Right    2001-2002   XI ROBOTIC ASSISTED PARAESOPHAGEAL HERNIA REPAIR N/A  10/30/2023   Procedure: XI ROBOTIC ASSISTED PARAESOPHAGEAL HERNIA REPAIR;  Surgeon: Shyrl Linnie KIDD, MD;  Location: [facility] OR;  Service: Thoracic;  Laterality: N/A;    Prior to Admission medications  Medication Sig Start Date End Date Taking? Authorizing Provider  Alpha-Lipoic Acid 600 MG CAPS Take 600 mg by mouth daily. Patient taking differently: Take 600 mg by mouth in the morning and at bedtime.   Yes [provider]  aspirin  EC 81 MG tablet Take 81 mg by mouth daily.   Yes [provider]  bisoprolol -hydrochlorothiazide  (ZIAC ) 5-6.25 MG tablet TAKE 1 TABLET DAILY. Please make overdue appt with Dr. Jeffrie before anymore refills. 2nd attempt Patient taking differently: Take 1 tablet by mouth daily. TAKE 1 TABLET DAILY. Please make overdue appt with Dr. Jeffrie before anymore refills. 2nd attempt 05/23/20  Yes Jeffrie Oneil BROCKS, MD  cetirizine (ZYRTEC) 10 MG tablet Take 10 mg by mouth daily.   Yes [provider]  doxazosin  (CARDURA ) 4 MG tablet Take 4 mg by mouth daily. 08/11/22  Yes [provider]  DULoxetine  (CYMBALTA ) 60 MG capsule Take 1 capsule (60 mg total) by mouth daily. 07/05/24 09/28/25 Yes Lomax, Amy, NP  metFORMIN (GLUCOPHAGE) 500 MG tablet Take 500 mg by mouth 2 (two) times daily with a meal. 04/17/23  Yes [provider]  Multiple Vitamin (MULTIVITAMIN) tablet Take 1 tablet by mouth daily.   Yes [provider]  pravastatin (PRAVACHOL) 80 MG tablet Take 80 mg by  mouth daily. 06/18/22  Yes [provider]  VITAMIN D, CHOLECALCIFEROL, PO Take 1 capsule by mouth daily.   Yes [provider]  Zinc 50 MG TABS Take by mouth. Patient taking differently: Take 1 tablet by mouth daily at 6 (six) AM.   Yes [provider]  acetaminophen  (TYLENOL ) 500 MG tablet Take 1,000 mg by mouth every 6 (six) hours as needed for moderate pain (pain score 4-6).    [provider]  OZEMPIC, 0.25 OR 0.5 MG/DOSE, 2 MG/3ML SOPN  Inject 0.5 mg into the skin once a week.    [provider]    Current Outpatient Medications  Medication Sig Dispense Refill   Alpha-Lipoic Acid 600 MG CAPS Take 600 mg by mouth daily. (Patient taking differently: Take 600 mg by mouth in the morning and at bedtime.)     aspirin  EC 81 MG tablet Take 81 mg by mouth daily.     bisoprolol -hydrochlorothiazide  (ZIAC ) 5-6.25 MG tablet TAKE 1 TABLET DAILY. Please make overdue appt with Dr. Jeffrie before anymore refills. 2nd attempt (Patient taking differently: Take 1 tablet by mouth daily. TAKE 1 TABLET DAILY. Please make overdue appt with Dr. Jeffrie before anymore refills. 2nd attempt) 15 tablet 0   cetirizine (ZYRTEC) 10 MG tablet Take 10 mg by mouth daily.     doxazosin  (CARDURA ) 4 MG tablet Take 4 mg by mouth daily.     DULoxetine  (CYMBALTA ) 60 MG capsule Take 1 capsule (60 mg total) by mouth daily. 90 capsule 3   metFORMIN (GLUCOPHAGE) 500 MG tablet Take 500 mg by mouth 2 (two) times daily with a meal.     Multiple Vitamin (MULTIVITAMIN) tablet Take 1 tablet by mouth daily.     pravastatin (PRAVACHOL) 80 MG tablet Take 80 mg by mouth daily.     VITAMIN D, CHOLECALCIFEROL, PO Take 1 capsule by mouth daily.     Zinc 50 MG TABS Take by mouth. (Patient taking differently: Take 1 tablet by mouth daily at 6 (six) AM.)     acetaminophen  (TYLENOL ) 500 MG tablet Take 1,000 mg by mouth every 6 (six) hours as needed for moderate pain (pain score 4-6).     OZEMPIC, 0.25 OR 0.5 MG/DOSE, 2 MG/3ML SOPN Inject 0.5 mg into the skin once a week.     Current Facility-Administered Medications  Medication Dose Route Frequency Provider Last Rate Last Admin   0.9 %  sodium chloride  infusion  500 mL Intravenous Once Brookes Craine V, MD        Allergies as of 12/07/2024   (No Known Allergies)    Family History  Problem Relation Age of Onset   Heart disease Mother    Heart disease Father    Diabetes Father    Skin cancer Father    Valvular heart  disease Brother    Heart disease Paternal Grandmother    Heart disease Paternal Grandfather    Chiari malformation Daughter    Other Daughter        Noonan syndrome   Colon cancer Neg Hx    Stomach cancer Neg Hx    Rectal cancer Neg Hx    Esophageal cancer Neg Hx    Colon polyps Neg Hx     Social History   Socioeconomic History   Marital status: Married    Spouse name: Sandrea   Number of children: 2   Years of education: 16   Highest education level: Not on file  Occupational History   Occupation:  production management    Comment: [employer]  Tobacco Use   Smoking status: Never   Smokeless tobacco: Never  Vaping Use   Vaping status: Never Used  Substance and Sexual Activity   Alcohol use: Yes    Alcohol/week: 0.0 standard drinks of alcohol    Comment: 2 per month   Drug use: No   Sexual activity: Not on file  Other Topics Concern   Not on file  Social History Narrative   Lives with wife   Caffeine use- coffee 2 cups daily   Social Drivers of Health   Tobacco Use: Low Risk (12/07/2024)   Patient History    Smoking Tobacco Use: Never    Smokeless Tobacco Use: Never    Passive Exposure: Not on file  Financial Resource Strain: Not on file  Food Insecurity: No Food Insecurity (10/30/2023)   Hunger Vital Sign    Worried About Running Out of Food in the Last Year: Never true    Ran Out of Food in the Last Year: Never true  Transportation Needs: No Transportation Needs (10/30/2023)   PRAPARE - Administrator, Civil Service (Medical): No    Lack of Transportation (Non-Medical): No  Physical Activity: Not on file  Stress: Not on file  Social Connections: Not on file  Intimate Partner Violence: Not At Risk (10/30/2023)   Humiliation, Afraid, Rape, and Kick questionnaire    Fear of Current or Ex-Partner: No    Emotionally Abused: No    Physically Abused: No    Sexually Abused: No  Depression (PHQ2-9): Not on file  Alcohol Screen: Not on file   Housing: Low Risk (10/30/2023)   Housing    Last Housing Risk Score: 0  Utilities: Not At Risk (10/30/2023)   AHC Utilities    Threatened with loss of utilities: No  Health Literacy: Not on file    Review of Systems:  All other review of systems negative except as mentioned in the HPI.  Physical Exam: Vital signs in last 24 hours: BP (!) 120/51   Pulse (!) 57   Temp 98.1 F (36.7 C)   Ht 5' 10 (1.778 m)   Wt 270 lb (122.5 kg)   SpO2 95%   BMI 38.74 kg/m  General:   Alert, NAD Lungs:  Clear .   Heart:  Regular rate and rhythm Abdomen:  Soft, nontender and nondistended. Neuro/Psych:  Alert and cooperative. Normal mood and affect. A and O x 3  Reviewed labs, radiology imaging, old records and pertinent past GI work up  Patient is appropriate for planned procedure(s) and anesthesia in an ambulatory setting   K. Veena Harless Molinari , MD [phone]

## 2024-12-07 NOTE — Op Note (Signed)
 [facility] Patient Name: Timothy [name] Procedure Date: 12/07/2024 9:51 AM MRN: [id number] Endoscopist: Gustav ALONSO Mcgee , MD, [id number] Age: 64 Referring MD:  Date of Birth: 12/19/1960 Gender: Male Account #: [id number] Procedure:                Colonoscopy Indications:              Screening for colorectal malignant neoplasm Medicines:                Monitored Anesthesia Care Procedure:                Pre-Anesthesia Assessment:                           - Prior to the procedure, a History and Physical                            was performed, and patient medications and                            allergies were reviewed. The patient's tolerance of                            previous anesthesia was also reviewed. The risks                            and benefits of the procedure and the sedation                            options and risks were discussed with the patient.                            All questions were answered, and informed consent                            was obtained. Prior Anticoagulants: The patient has                            taken no anticoagulant or antiplatelet agents. ASA                            Grade Assessment: III - A patient with severe                            systemic disease. After reviewing the risks and                            benefits, the patient was deemed in satisfactory                            condition to undergo the procedure.                           After obtaining informed consent, the colonoscope  was passed under direct vision. Throughout the                            procedure, the patient's blood pressure, pulse, and                            oxygen saturations were monitored continuously. The                            Olympus Scope [id number] was introduced through the                            anus and advanced to the the cecum, identified by                             appendiceal orifice and ileocecal valve. The                            colonoscopy was performed without difficulty. The                            patient tolerated the procedure well. The quality                            of the bowel preparation was good. The ileocecal                            valve, appendiceal orifice, and rectum were                            photographed. Scope In: 10:11:35 AM Scope Out: 10:28:04 AM Scope Withdrawal Time: 0 hours 12 minutes 10 seconds  Total Procedure Duration: 0 hours 16 minutes 29 seconds  Findings:                 The perianal and digital rectal examinations were                            normal.                           Two sessile polyps were found in the transverse                            colon and cecum. The polyps were 4 to 6 mm in size.                            These polyps were removed with a cold snare.                            Resection and retrieval were complete.                           Scattered small-mouthed diverticula were found in  the sigmoid colon and descending colon.                           Non-bleeding external and internal hemorrhoids were                            found during retroflexion. The hemorrhoids were                            medium-sized. Complications:            No immediate complications. Estimated Blood Loss:     Estimated blood loss was minimal. Impression:               - Two 4 to 6 mm polyps in the transverse colon and                            in the cecum, removed with a cold snare. Resected                            and retrieved.                           - Diverticulosis in the sigmoid colon and in the                            descending colon.                           - Non-bleeding external and internal hemorrhoids. Recommendation:           - Resume previous diet.                           - Continue present medications.                            - Await pathology results.                           - Repeat colonoscopy in 5-10 years for surveillance                            based on pathology results. Khing Belcher V. Venus Gilles, MD 12/07/2024 10:34:06 AM This report has been signed electronically.

## 2024-12-07 NOTE — Progress Notes (Unsigned)
 Called to room to assist during endoscopic procedure.  Patient ID and intended procedure confirmed with present staff. Received instructions for my participation in the procedure from the performing physician.

## 2024-12-07 NOTE — Progress Notes (Unsigned)
 Pt does have a lump on the left side of his neck that was discovered by his Dermatologist. He has full range of motion and has an ultrasound scheduled for the end of the month.

## 2024-12-08 ENCOUNTER — Telehealth: Payer: Self-pay | Admitting: *Deleted

## 2024-12-08 NOTE — Telephone Encounter (Signed)
" °  Follow up Call-     12/07/2024    9:07 AM 09/13/2022    8:51 AM  Call back number  Post procedure Call Back phone  # [phone]  Permission to leave phone message Yes Yes     Patient questions:  Do you have a fever, pain , or abdominal swelling? No. Pain Score  0 *  Have you tolerated food without any problems? Yes.    Have you been able to return to your normal activities? Yes.    Do you have any questions about your discharge instructions: Diet   No. Medications  No. Follow up visit  No.  Do you have questions or concerns about your Care? No.  Actions: * If pain score is 4 or above: No action needed, pain <4.   "

## 2024-12-09 LAB — SURGICAL PATHOLOGY

## 2024-12-10 ENCOUNTER — Ambulatory Visit: Payer: Self-pay | Admitting: Gastroenterology

## 2025-01-21 ENCOUNTER — Other Ambulatory Visit (HOSPITAL_COMMUNITY): Payer: Self-pay

## 2025-07-04 ENCOUNTER — Ambulatory Visit: Admitting: Family Medicine
